# Patient Record
Sex: Female | Born: 1980 | Race: White | Hispanic: No | Marital: Married | Smoking: Never smoker
Health system: Southern US, Community
[De-identification: ages and names within clinical notes are randomized; demographics above are authoritative.]

## PROBLEM LIST (undated history)

## (undated) ENCOUNTER — Inpatient Hospital Stay (HOSPITAL_COMMUNITY): Payer: Self-pay

## (undated) DIAGNOSIS — K219 Gastro-esophageal reflux disease without esophagitis: Secondary | ICD-10-CM

## (undated) DIAGNOSIS — T7840XA Allergy, unspecified, initial encounter: Secondary | ICD-10-CM

## (undated) DIAGNOSIS — E119 Type 2 diabetes mellitus without complications: Secondary | ICD-10-CM

## (undated) DIAGNOSIS — F419 Anxiety disorder, unspecified: Secondary | ICD-10-CM

## (undated) DIAGNOSIS — Z8679 Personal history of other diseases of the circulatory system: Secondary | ICD-10-CM

## (undated) DIAGNOSIS — I10 Essential (primary) hypertension: Secondary | ICD-10-CM

## (undated) DIAGNOSIS — Z8659 Personal history of other mental and behavioral disorders: Secondary | ICD-10-CM

## (undated) DIAGNOSIS — F329 Major depressive disorder, single episode, unspecified: Secondary | ICD-10-CM

## (undated) HISTORY — DX: Personal history of other mental and behavioral disorders: Z86.59

## (undated) HISTORY — DX: Gastro-esophageal reflux disease without esophagitis: K21.9

## (undated) HISTORY — DX: Type 2 diabetes mellitus without complications: E11.9

## (undated) HISTORY — DX: Major depressive disorder, single episode, unspecified: F32.9

## (undated) HISTORY — DX: Allergy, unspecified, initial encounter: T78.40XA

## (undated) HISTORY — DX: Personal history of other diseases of the circulatory system: Z86.79

## (undated) HISTORY — DX: Anxiety disorder, unspecified: F41.9

## (undated) HISTORY — DX: Essential (primary) hypertension: I10

## (undated) HISTORY — PX: WISDOM TOOTH EXTRACTION: SHX21

---

## 2002-05-05 ENCOUNTER — Other Ambulatory Visit: Admission: RE | Admit: 2002-05-05 | Discharge: 2002-05-05 | Payer: Self-pay | Admitting: Obstetrics and Gynecology

## 2002-05-07 HISTORY — PX: REFRACTIVE SURGERY: SHX103

## 2002-05-07 HISTORY — PX: OTHER SURGICAL HISTORY: SHX169

## 2003-06-01 ENCOUNTER — Other Ambulatory Visit: Admission: RE | Admit: 2003-06-01 | Discharge: 2003-06-01 | Payer: Self-pay | Admitting: Obstetrics and Gynecology

## 2003-09-29 ENCOUNTER — Ambulatory Visit (HOSPITAL_COMMUNITY): Admission: RE | Admit: 2003-09-29 | Discharge: 2003-09-29 | Payer: Self-pay | Admitting: Neurology

## 2005-02-13 ENCOUNTER — Ambulatory Visit: Payer: Self-pay | Admitting: Cardiovascular Disease

## 2005-04-02 ENCOUNTER — Ambulatory Visit: Payer: Self-pay

## 2005-04-02 ENCOUNTER — Encounter: Payer: Self-pay | Admitting: Cardiology

## 2005-04-02 ENCOUNTER — Ambulatory Visit (HOSPITAL_COMMUNITY): Admission: RE | Admit: 2005-04-02 | Discharge: 2005-04-02 | Payer: Self-pay | Admitting: Family Medicine

## 2006-07-24 LAB — CONVERTED CEMR LAB: Pap Smear: NORMAL

## 2006-12-01 ENCOUNTER — Emergency Department (HOSPITAL_COMMUNITY): Admission: EM | Admit: 2006-12-01 | Discharge: 2006-12-01 | Payer: Self-pay | Admitting: Family Medicine

## 2007-02-26 ENCOUNTER — Encounter: Admission: RE | Admit: 2007-02-26 | Discharge: 2007-02-26 | Payer: Self-pay | Admitting: Obstetrics and Gynecology

## 2007-03-17 ENCOUNTER — Emergency Department (HOSPITAL_COMMUNITY): Admission: EM | Admit: 2007-03-17 | Discharge: 2007-03-17 | Payer: Self-pay | Admitting: Family Medicine

## 2007-07-02 ENCOUNTER — Emergency Department (HOSPITAL_COMMUNITY): Admission: EM | Admit: 2007-07-02 | Discharge: 2007-07-02 | Payer: Self-pay | Admitting: Family Medicine

## 2007-07-03 ENCOUNTER — Emergency Department (HOSPITAL_COMMUNITY): Admission: EM | Admit: 2007-07-03 | Discharge: 2007-07-03 | Payer: Self-pay | Admitting: Emergency Medicine

## 2007-07-04 ENCOUNTER — Telehealth (INDEPENDENT_AMBULATORY_CARE_PROVIDER_SITE_OTHER): Payer: Self-pay | Admitting: *Deleted

## 2007-07-10 ENCOUNTER — Ambulatory Visit: Payer: Self-pay | Admitting: Internal Medicine

## 2007-07-10 LAB — CONVERTED CEMR LAB
ALT: 19 units/L (ref 0–35)
AST: 17 units/L (ref 0–37)
Albumin: 3.3 g/dL — ABNORMAL LOW (ref 3.5–5.2)
Alkaline Phosphatase: 48 units/L (ref 39–117)
BUN: 9 mg/dL (ref 6–23)
Basophils Absolute: 0 10*3/uL (ref 0.0–0.1)
Basophils Relative: 0.4 % (ref 0.0–1.0)
Bilirubin Urine: NEGATIVE
Bilirubin, Direct: 0.1 mg/dL (ref 0.0–0.3)
CO2: 29 meq/L (ref 19–32)
Calcium: 8.8 mg/dL (ref 8.4–10.5)
Chloride: 104 meq/L (ref 96–112)
Cholesterol: 166 mg/dL (ref 0–200)
Creatinine, Ser: 0.7 mg/dL (ref 0.4–1.2)
Eosinophils Absolute: 0.1 10*3/uL (ref 0.0–0.6)
Eosinophils Relative: 2.1 % (ref 0.0–5.0)
GFR calc Af Amer: 130 mL/min
GFR calc non Af Amer: 108 mL/min
Glucose, Bld: 82 mg/dL (ref 70–99)
HCT: 39.5 % (ref 36.0–46.0)
HDL: 38.4 mg/dL — ABNORMAL LOW (ref >39.0)
Hemoglobin: 13.6 g/dL (ref 12.0–15.0)
Ketones, ur: NEGATIVE mg/dL
LDL Cholesterol: 104 mg/dL — ABNORMAL HIGH (ref 0–99)
Leukocytes, UA: NEGATIVE
Lymphocytes Relative: 49.4 % — ABNORMAL HIGH (ref 12.0–46.0)
MCHC: 34.5 g/dL (ref 30.0–36.0)
MCV: 86.6 fL (ref 78.0–100.0)
Monocytes Absolute: 0.4 10*3/uL (ref 0.2–0.7)
Monocytes Relative: 8.4 % (ref 3.0–11.0)
Neutro Abs: 2 10*3/uL (ref 1.4–7.7)
Neutrophils Relative %: 39.7 % — ABNORMAL LOW (ref 43.0–77.0)
Nitrite: NEGATIVE
Platelets: 326 10*3/uL (ref 150–400)
Potassium: 4.1 meq/L (ref 3.5–5.1)
RBC: 4.56 M/uL (ref 3.87–5.11)
RDW: 10.8 % — ABNORMAL LOW (ref 11.5–14.6)
Sodium: 138 meq/L (ref 135–145)
Specific Gravity, Urine: 1.025 (ref 1.000–1.03)
TSH: 1.08 microintl units/mL (ref 0.35–5.50)
Total Bilirubin: 0.3 mg/dL (ref 0.3–1.2)
Total CHOL/HDL Ratio: 4.3
Total Protein, Urine: NEGATIVE mg/dL
Total Protein: 7 g/dL (ref 6.0–8.3)
Triglycerides: 117 mg/dL (ref 0–149)
Urine Glucose: NEGATIVE mg/dL
Urobilinogen, UA: 0.2 (ref 0.0–1.0)
VLDL: 23 mg/dL (ref 0–40)
WBC: 5 10*3/uL (ref 4.5–10.5)
pH: 6 (ref 5.0–8.0)

## 2007-07-14 ENCOUNTER — Ambulatory Visit: Payer: Self-pay | Admitting: Internal Medicine

## 2007-07-14 DIAGNOSIS — G43701 Chronic migraine without aura, not intractable, with status migrainosus: Secondary | ICD-10-CM | POA: Insufficient documentation

## 2007-07-14 DIAGNOSIS — R51 Headache: Secondary | ICD-10-CM

## 2007-07-14 DIAGNOSIS — M549 Dorsalgia, unspecified: Secondary | ICD-10-CM | POA: Insufficient documentation

## 2007-07-14 DIAGNOSIS — F3289 Other specified depressive episodes: Secondary | ICD-10-CM | POA: Insufficient documentation

## 2007-07-14 DIAGNOSIS — F329 Major depressive disorder, single episode, unspecified: Secondary | ICD-10-CM

## 2007-07-14 HISTORY — DX: Major depressive disorder, single episode, unspecified: F32.9

## 2007-07-14 HISTORY — DX: Other specified depressive episodes: F32.89

## 2007-10-25 ENCOUNTER — Emergency Department (HOSPITAL_COMMUNITY): Admission: EM | Admit: 2007-10-25 | Discharge: 2007-10-25 | Payer: Self-pay | Admitting: Emergency Medicine

## 2007-11-03 ENCOUNTER — Telehealth: Payer: Self-pay | Admitting: Internal Medicine

## 2007-11-04 ENCOUNTER — Ambulatory Visit: Payer: Self-pay | Admitting: Internal Medicine

## 2007-11-04 LAB — CONVERTED CEMR LAB
Bilirubin Urine: NEGATIVE
Hemoglobin, Urine: NEGATIVE
Ketones, ur: NEGATIVE mg/dL
Leukocytes, UA: NEGATIVE
Nitrite: NEGATIVE
Specific Gravity, Urine: <=1.005 (ref 1.000–1.03)
Total Protein, Urine: NEGATIVE mg/dL
Urine Glucose: NEGATIVE mg/dL
Urobilinogen, UA: 0.2 (ref 0.0–1.0)
pH: 6 (ref 5.0–8.0)

## 2007-11-05 ENCOUNTER — Ambulatory Visit: Payer: Self-pay | Admitting: Internal Medicine

## 2007-11-05 DIAGNOSIS — R300 Dysuria: Secondary | ICD-10-CM | POA: Insufficient documentation

## 2007-11-05 DIAGNOSIS — H5310 Unspecified subjective visual disturbances: Secondary | ICD-10-CM | POA: Insufficient documentation

## 2007-11-05 LAB — CONVERTED CEMR LAB
BUN: 8 mg/dL (ref 6–23)
CO2: 28 meq/L (ref 19–32)
Calcium: 9.3 mg/dL (ref 8.4–10.5)
Chloride: 103 meq/L (ref 96–112)
Creatinine, Ser: 0.7 mg/dL (ref 0.4–1.2)
GFR calc Af Amer: 130 mL/min
GFR calc non Af Amer: 108 mL/min
Glucose, Bld: 96 mg/dL (ref 70–99)
Potassium: 4.1 meq/L (ref 3.5–5.1)
Sodium: 139 meq/L (ref 135–145)

## 2007-11-06 ENCOUNTER — Ambulatory Visit (HOSPITAL_COMMUNITY): Admission: RE | Admit: 2007-11-06 | Discharge: 2007-11-06 | Payer: Self-pay | Admitting: Internal Medicine

## 2007-11-10 ENCOUNTER — Telehealth: Payer: Self-pay | Admitting: Internal Medicine

## 2007-11-24 ENCOUNTER — Encounter: Payer: Self-pay | Admitting: Internal Medicine

## 2009-06-20 ENCOUNTER — Ambulatory Visit: Payer: Self-pay | Admitting: Internal Medicine

## 2009-06-20 DIAGNOSIS — J45901 Unspecified asthma with (acute) exacerbation: Secondary | ICD-10-CM | POA: Insufficient documentation

## 2009-06-22 ENCOUNTER — Emergency Department (HOSPITAL_COMMUNITY): Admission: EM | Admit: 2009-06-22 | Discharge: 2009-06-22 | Payer: Self-pay | Admitting: Emergency Medicine

## 2009-06-22 ENCOUNTER — Encounter: Payer: Self-pay | Admitting: Cardiovascular Disease

## 2009-06-23 ENCOUNTER — Ambulatory Visit: Payer: Self-pay | Admitting: Internal Medicine

## 2009-06-23 LAB — CONVERTED CEMR LAB
Free T4: 0.9 ng/dL (ref 0.6–1.6)
T3 Uptake Ratio: 38.6 % — ABNORMAL HIGH (ref 22.5–37.0)
TSH: 1.21 microintl units/mL (ref 0.35–5.50)

## 2009-06-26 ENCOUNTER — Telehealth: Payer: Self-pay | Admitting: Internal Medicine

## 2009-06-28 ENCOUNTER — Telehealth (INDEPENDENT_AMBULATORY_CARE_PROVIDER_SITE_OTHER): Payer: Self-pay | Admitting: *Deleted

## 2009-07-11 ENCOUNTER — Encounter: Payer: Self-pay | Admitting: Internal Medicine

## 2009-07-11 ENCOUNTER — Ambulatory Visit: Payer: Self-pay

## 2009-07-22 ENCOUNTER — Telehealth: Payer: Self-pay | Admitting: Internal Medicine

## 2010-03-23 DIAGNOSIS — Z8679 Personal history of other diseases of the circulatory system: Secondary | ICD-10-CM

## 2010-03-23 DIAGNOSIS — Z8659 Personal history of other mental and behavioral disorders: Secondary | ICD-10-CM

## 2010-03-23 DIAGNOSIS — M21619 Bunion of unspecified foot: Secondary | ICD-10-CM | POA: Insufficient documentation

## 2010-03-23 DIAGNOSIS — I7300 Raynaud's syndrome without gangrene: Secondary | ICD-10-CM | POA: Insufficient documentation

## 2010-03-23 HISTORY — DX: Personal history of other diseases of the circulatory system: Z86.79

## 2010-03-23 HISTORY — DX: Personal history of other mental and behavioral disorders: Z86.59

## 2010-03-24 ENCOUNTER — Ambulatory Visit: Payer: Self-pay | Admitting: Cardiovascular Disease

## 2010-03-24 DIAGNOSIS — I10 Essential (primary) hypertension: Secondary | ICD-10-CM | POA: Insufficient documentation

## 2010-03-24 DIAGNOSIS — R002 Palpitations: Secondary | ICD-10-CM | POA: Insufficient documentation

## 2010-03-24 DIAGNOSIS — I471 Supraventricular tachycardia, unspecified: Secondary | ICD-10-CM | POA: Insufficient documentation

## 2010-03-24 HISTORY — DX: Essential (primary) hypertension: I10

## 2010-04-06 ENCOUNTER — Ambulatory Visit (HOSPITAL_COMMUNITY)
Admission: RE | Admit: 2010-04-06 | Discharge: 2010-04-06 | Payer: Self-pay | Source: Home / Self Care | Admitting: Cardiovascular Disease

## 2010-04-06 ENCOUNTER — Ambulatory Visit: Payer: Self-pay | Admitting: Cardiovascular Disease

## 2010-04-06 ENCOUNTER — Ambulatory Visit: Payer: Self-pay

## 2010-04-06 ENCOUNTER — Encounter: Payer: Self-pay | Admitting: Cardiovascular Disease

## 2010-04-06 ENCOUNTER — Ambulatory Visit: Payer: Self-pay | Admitting: Cardiology

## 2010-04-06 LAB — CONVERTED CEMR LAB
Anti Nuclear Antibody(ANA): NEGATIVE
Free T4: 1.06 ng/dL (ref 0.80–1.80)
Rheumatoid fact SerPl-aCnc: <20 intl units/mL (ref 0–20)
Scleroderma (Scl-70) (ENA) Antibody, IgG: <1 (ref <30)
Sed Rate: 14 mm/hr (ref 0–22)
TSH: 1.225 microintl units/mL (ref 0.350–4.500)

## 2010-04-17 ENCOUNTER — Ambulatory Visit: Payer: Self-pay | Admitting: Cardiovascular Disease

## 2010-04-17 DIAGNOSIS — R9389 Abnormal findings on diagnostic imaging of other specified body structures: Secondary | ICD-10-CM | POA: Insufficient documentation

## 2010-06-06 NOTE — Progress Notes (Signed)
----   Converted from flag ---- ---- 06/23/2009 1:24 PM, Edman Circle wrote: appt 3/4 @ 10:30 NPO midnite. clear liquids ok no Carbonated beverages. no gasey foods  ---- 06/23/2009 12:39 PM, Dagoberto Reef wrote: Thanks  ---- 06/23/2009 12:33 PM, Jacques Navy MD wrote: The following orders have been entered for this patient and placed on Admin Hold:  Type:     Referral       Code:   Radiology Description:   Radiology Referral Order Date:   06/23/2009   Authorized By:   Jacques Navy MD Order #:   [id number] Clinical Notes:   Name of Test or Procedure: Renal artery doppler study -  patient with new onset hypertension r/o RAS  Of What:  Special Instructions ------------------------------

## 2010-06-06 NOTE — Assessment & Plan Note (Signed)
Summary: bp: 171/107 pulse: 114 / ok ami/cd   Vital Signs:  Patient profile:   30 year old female Height:      66 inches Weight:      131.50 pounds O2 Sat:      98 % on Room air Temp:     98.5 degrees F oral Pulse rate:   103 / minute BP sitting:   152 / 82  (left arm) Cuff size:   regular  Vitals Entered By: Brenton Grills (June 23, 2009 11:40 AM)  O2 Flow:  Room air CC: Pt has elevated BP since yesterday. Went to urgent care last ight./aj    Primary Care Provider:  Norins  CC:  Pt has elevated BP since yesterday. Went to urgent care last ight./aj .  History of Present Illness: Patient presents for recent problems with hypertension and tachycardia with BP to 171/107 and HR of 114. She has had this for several days which she has monitored at the hospital. She did go to Urgent Care - by her report she had a D-dimer and CXR  Current Medications (verified): 1)  Yaz 3-0.02 Mg  Tabs (Drospirenone-Ethinyl Estradiol) .... As Directed 2)  Xanax 0.25 Mg  Tabs (Alprazolam) .... Take 2 Tab By Mouth At Bedtime 3)  Tums 500 Mg  Chew (Calcium Carbonate Antacid) .... As Directed As Needed 4)  Strattera 100 Mg Caps (Atomoxetine Hcl) .Marland Kitchen.. 1 Qd  Allergies (verified): 1)  ! Sulfa 2)  ! Jonne Ply  Past History:  Past Medical History: Last updated: 07/14/2007 UCD Asthma - childhood ADHD Irregular heart rhythm - wore holter, no serious problem, medication related. Depression Headache Raynaud's Bunions    Phyician roster:                    Gyn - Billy Coast                    Psychiatry - Kauer                    Counselor- Madelaine Etienne  Past Surgical History: Last updated: 07/14/2007 Lasix eye surgery '04 PSH reviewed for relevance, FH reviewed for relevance  Review of Systems  The patient denies anorexia, fever, weight loss, weight gain, vision loss, chest pain, syncope, dyspnea on exertion, headaches, hemoptysis, muscle weakness, difficulty walking, and enlarged lymph nodes.     Physical Exam  General:  alert, well-developed, and well-nourished.   Head:  normocephalic and atraumatic.   Eyes:  vision grossly intact, pupils equal, pupils round, corneas and lenses clear, no optic disk abnormalities, and no retinal abnormalitiies.   Lungs:  normal respiratory effort and no intercostal retractions.   Heart:  normal rate and regular rhythm.   Neurologic:  alert & oriented X3, cranial nerves II-XII intact, gait normal, and DTRs symmetrical and normal.     Impression & Recommendations:  Problem # 1:  ELEVATED BLOOD PRESSURE WITHOUT DIAGNOSIS OF HYPERTENSION (ICD-796.2) New on-set elevated BP. Record reviewed - over the past 2 years there has been a gradual increase in BP. Now with suden elevation raising issue of secondary causation: RAS. Very unlikely to have pheo -with no adrenergic symptoms.  Plan = start low dose beta-blocker            f/u BP in the next day or three            Renal artery doppler as first step to r/o RAS  Her updated medication list for  this problem includes:    Metoprolol Tartrate 25 Mg Tabs (Metoprolol tartrate) .Marland Kitchen... 1 by mouth two times a day  Orders: TLB-TSH (Thyroid Stimulating Hormone) (84443-TSH) TLB-T4 (Thyrox), Free [phone]) TLB-T3 Uptake (58099-I3JA) Radiology Referral (Radiology)  Complete Medication List: 1)  Yaz 3-0.02 Mg Tabs (Drospirenone-ethinyl estradiol) .... As directed 2)  Xanax 0.25 Mg Tabs (Alprazolam) .... Take 2 tab by mouth at bedtime 3)  Tums 500 Mg Chew (Calcium carbonate antacid) .... As directed as needed 4)  Strattera 100 Mg Caps (Atomoxetine hcl) .Marland Kitchen.. 1 qd 5)  Metoprolol Tartrate 25 Mg Tabs (Metoprolol tartrate) .Marland Kitchen.. 1 by mouth two times a day Prescriptions: METOPROLOL TARTRATE 25 MG TABS (METOPROLOL TARTRATE) 1 by mouth two times a day  #60 x 1   Entered and Authorized by:   Jacques Navy MD   Signed by:   Jacques Navy MD on 06/23/2009   Method used:   Electronically to        [facility]* (retail)       [address]       [address] Shipping/mailing       [place], [place]  [address]       Ph: [phone]       Fax: [phone]   RxID:   [id number]

## 2010-06-06 NOTE — Progress Notes (Signed)
Summary: BLADDER INFECTION ???? WAS SEEN IN URG CARE  Phone Note Call from Patient   Caller: Patient Summary of Call: PER PT WAS SEEN AT [facility] CARE 10/25/07 CONCERNING BLADDER INFECTION.  WAS GIVEN ANTIBIOTICS/NOT HELPING.  REQUESTING THIS [facility] APPT.  Cleotis Lema #  [id number] Initial call taken by: Ivar Bury,  November 03, 2007 1:41 PM  Follow-up for Phone Call        OK for appointment preceeded by repeat U/A Wedns Follow-up by: Jacques Navy MD,  November 03, 2007 6:23 PM  Additional Follow-up for Phone Call Additional follow up Details #1::        Appt Scheduled lab ordered in IDX pt to come to lab today after work and appt in the AM. Additional Follow-up by: Zackery Barefoot CMA,  November 04, 2007 9:29 AM

## 2010-06-06 NOTE — Assessment & Plan Note (Signed)
Summary: ASTHMA/ NEEDS RX/NWS  #   Vital Signs:  Patient profile:   30 year old female Height:      66 inches Weight:      133 pounds BMI:     21.54 O2 Sat:      98 % on Room air Temp:     97.6 degrees F oral Pulse rate:   105 / minute BP sitting:   148 / 92  (left arm) Cuff size:   regular  Vitals Entered By: Bill Salinas CMA (June 20, 2009 3:34 PM)  O2 Flow:  Room air CC: pt here due to sudden onset of asthma attack this weekend. Pt thinks it may have been triggered by ciggerrette smoke/ ab   Primary Care Provider:  Norins  CC:  pt here due to sudden onset of asthma attack this weekend. Pt thinks it may have been triggered by ciggerrette smoke/ ab.  History of Present Illness: Long h/o asthma and allergy with shots and treatments. She has done well for the last few years. She does have some cold exposure wheezing. This past weekend she was expsed to smoke and subsequently had an asthmatic flare: very tight chest and wheezing. She did clear after removal from exposure but has some residual discomfort. She has not had an inhaler for some time and feels that she may need to have an inhaler for emergencies.   Current Medications (verified): 1)  Yaz 3-0.02 Mg  Tabs (Drospirenone-Ethinyl Estradiol) .... As Directed 2)  Xanax 0.25 Mg  Tabs (Alprazolam) .... Take 2 Tab By Mouth At Bedtime 3)  Tums 500 Mg  Chew (Calcium Carbonate Antacid) .... As Directed As Needed 4)  Strattera 100 Mg Caps (Atomoxetine Hcl) .Marland Kitchen.. 1 Qd  Allergies (verified): 1)  ! Sulfa 2)  ! Jonne Ply  Past History:  Past Medical History: Last updated: 07/14/2007 UCD Asthma - childhood ADHD Irregular heart rhythm - wore holter, no serious problem, medication related. Depression Headache Raynaud's Bunions    Phyician roster:                    Gyn - Billy Coast                    Psychiatry - Kauer                    Counselor- Madelaine Etienne  Past Surgical History: Last updated: 07/14/2007 Lasix eye  surgery '04 PSH reviewed for relevance, FH reviewed for relevance  Review of Systems       The patient complains of dyspnea on exertion and prolonged cough.  The patient denies anorexia, fever, weight loss, weight gain, decreased hearing, hoarseness, chest pain, peripheral edema, melena, severe indigestion/heartburn, genital sores, suspicious skin lesions, difficulty walking, unusual weight change, enlarged lymph nodes, and angioedema.    Physical Exam  General:  Well-developed,well-nourished,in no acute distress; alert,appropriate and cooperative throughout examination Head:  Normocephalic and atraumatic without obvious abnormalities. No apparent alopecia or balding. Eyes:  corneas and lenses clear and no injection.   Lungs:  normal respiratory effort, no accessory muscle use, normal breath sounds, and no wheezes.   Heart:  normal rate and regular rhythm.     Impression & Recommendations:  Problem # 1:  ASTHMA, EXTRINSIC, WITH ACUTE EXACERBATION (ICD-493.02) Had a remote history of asthma and has had a recent exacerbtion due to cigarette smoke exposure. Currently doing well  Plan - ProAir MDI to have available for as needed use.  For recurrent episode with respiratory distress - seek medical attention.  Complete Medication List: 1)  Yaz 3-0.02 Mg Tabs (Drospirenone-ethinyl estradiol) .... As directed 2)  Xanax 0.25 Mg Tabs (Alprazolam) .... Take 2 tab by mouth at bedtime 3)  Tums 500 Mg Chew (Calcium carbonate antacid) .... As directed as needed 4)  Strattera 100 Mg Caps (Atomoxetine hcl) .Marland Kitchen.. 1 qd
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
## 2010-06-06 NOTE — Progress Notes (Signed)
Summary: Result ?'s  ---- Converted from flag ---- ---- 11/09/2007 3:54 PM, Jacques Navy MD wrote: please call patient: MRI brain is normal. For continued visual changes we will refer to neurology. ------------------------------  Phone Note Outgoing Call Call back at Work Phone [phone]   Call placed by: Zackery Barefoot CMA,  November 10, 2007 2:14 PM Call placed to: Patient Summary of Call: Advised pt of the above pt stated she is much better and is going to see her eye doctor to see if there are any changes. Pt states she reviewed her study and wants to know what the statement "This is compatible with a benign venous angioma." means. Advised pt will have to speak with MD being the MRI brain normal. Initial call taken by: Zackery Barefoot CMA,  November 10, 2007 2:14 PM  Follow-up for Phone Call        benign venous angioma is a vascular tangle of no signficance. Follow-up by: Jacques Navy MD,  November 10, 2007 5:57 PM  Additional Follow-up for Phone Call Additional follow up Details #1::        SPOKE WITH PATIENT, PATIENT OK'D INFORMATION. Additional Follow-up by: Shonna Chock,  November 11, 2007 3:13 PM

## 2010-06-06 NOTE — Progress Notes (Signed)
Summary: EARLIER APT  Phone Note Call from Patient Call back at [facility] Phone [phone]   Summary of Call: Pt recently seen at urgent care and ER recently for severe back pain. She has never had pain like this before. Pt has a new pt apt with Dr Debby Bud in April. Would you see pt for acute visit before April apt? Initial call taken by: Lamar Sprinkles,  July 04, 2007 11:59 AM  Follow-up for Phone Call        already passed along an e-mail message to Mrs. Caswell Corwin to schedule an earlier appointment. Follow-up by: Jacques Navy MD,  July 04, 2007 1:28 PM  Additional Follow-up for Phone Call Additional follow up Details #1::        I LMOM FRIDAY 2-27 AND 2 MORE MSG TODAY.  WAITING FOR HER TO CALL us BACK. Additional Follow-up by: Hilarie Fredrickson,  July 08, 2007 1:58 PM    Additional Follow-up for Phone Call Additional follow up Details #2::    HER APPT. HAS BEEN MOVED UP TO July 14, 2007 Follow-up by: Hilarie Fredrickson,  July 09, 2007 2:19 PM

## 2010-06-06 NOTE — Progress Notes (Signed)
Summary: BP UPDATE  Phone Note Outgoing Call   Reason for Call: Discuss lab or test results Summary of Call: please call patient - tyroid functions normal. How is her BP? Thanks Initial call taken by: Jacques Navy MD,  June 26, 2009 7:09 PM  Follow-up for Phone Call        pt states bp still elevated about 150 over 100. she states BP med drops BP rapidly so she is not taking. Pt states her pulse is still elevated 105- 110 Follow-up by: Ami Bullins CMA,  June 27, 2009 9:45 AM  Additional Follow-up for Phone Call Additional follow up Details #1::        Pt called back with update, BP today 150/99 pulse 110. She is not taking bp med. She has a 30 yr old and says med causes her to be lethargic and tired. Additional Follow-up by: Lamar Sprinkles, CMA,  June 27, 2009 2:38 PM    Additional Follow-up for Phone Call Additional follow up Details #2::    the metoprolol dose is low. The feeling of sluggishness will pass and she needs treatment for her hypertension and tachycardia. I strongly recommend taking the medication. If the side effects are intolerable add to my schedule for change in medication Follow-up by: Jacques Navy MD,  June 27, 2009 4:03 PM  Additional Follow-up for Phone Call Additional follow up Details #3:: Details for Additional Follow-up Action Taken: Spoke with pt, informed of above. She will try med and call office back with any further problems.  Additional Follow-up by: Lamar Sprinkles, CMA,  June 27, 2009 4:58 PM

## 2010-06-06 NOTE — Consult Note (Signed)
Summary: Consultation Report/[facility]  Consultation Report/[facility]   Imported By: Lester Norwich 12/01/2007 11:18:23  _____________________________________________________________________  External Attachment:    Type:   Image     Comment:   External Document
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
## 2010-06-06 NOTE — Letter (Signed)
Summary: MC: Physician Documentation Sheet  MC: Physician Documentation Sheet   Imported By: Earl Many 03/23/2010 11:31:08  _____________________________________________________________________  External Attachment:    Type:   Image     Comment:   External Document

## 2010-06-06 NOTE — Miscellaneous (Signed)
Summary: Orders Update  Clinical Lists Changes  Orders: Added new Test order of Renal Artery Duplex (Renal Artery Duplex) - Signed 

## 2010-06-06 NOTE — Progress Notes (Signed)
  Phone Note Outgoing Call   Reason for Call: Discuss lab or test results Summary of Call: please call patient - renal ultrasound with normal blood flow. No renal artery stenosis.  Thanks Initial call taken by: Jacques Navy MD,  July 22, 2009 1:16 PM  Follow-up for Phone Call        informed pt of results Follow-up by: Ami Bullins CMA,  July 22, 2009 3:34 PM
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
## 2010-07-26 LAB — D-DIMER, QUANTITATIVE: D-Dimer, Quant: <0.22 ug/mL-FEU (ref 0.00–0.48)

## 2010-09-17 ENCOUNTER — Emergency Department: Payer: Self-pay | Admitting: Emergency Medicine

## 2010-09-17 ENCOUNTER — Emergency Department (HOSPITAL_COMMUNITY)
Admission: EM | Admit: 2010-09-17 | Discharge: 2010-09-17 | Disposition: A | Discharge status: Home / Self Care | Payer: Commercial Managed Care - PPO | Attending: Emergency Medicine | Admitting: Emergency Medicine

## 2010-09-17 ENCOUNTER — Emergency Department (HOSPITAL_COMMUNITY): Payer: Commercial Managed Care - PPO

## 2010-09-17 DIAGNOSIS — M545 Low back pain, unspecified: Secondary | ICD-10-CM | POA: Insufficient documentation

## 2010-09-17 DIAGNOSIS — R319 Hematuria, unspecified: Secondary | ICD-10-CM | POA: Insufficient documentation

## 2010-09-17 DIAGNOSIS — I10 Essential (primary) hypertension: Secondary | ICD-10-CM | POA: Insufficient documentation

## 2010-09-17 LAB — URINE MICROSCOPIC-ADD ON

## 2010-09-17 LAB — POCT PREGNANCY, URINE
Preg Test, Ur: NEGATIVE
Preg Test, Ur: NEGATIVE

## 2010-09-17 LAB — URINALYSIS, ROUTINE W REFLEX MICROSCOPIC
Bilirubin Urine: NEGATIVE
Glucose, UA: NEGATIVE mg/dL
Ketones, ur: NEGATIVE mg/dL
Leukocytes, UA: NEGATIVE
Nitrite: NEGATIVE
Protein, ur: NEGATIVE mg/dL
Specific Gravity, Urine: 1.024 (ref 1.005–1.030)
Urobilinogen, UA: 0.2 mg/dL (ref 0.0–1.0)
pH: 5 (ref 5.0–8.0)

## 2010-10-06 ENCOUNTER — Encounter: Payer: Self-pay | Admitting: Internal Medicine

## 2010-10-06 ENCOUNTER — Ambulatory Visit (INDEPENDENT_AMBULATORY_CARE_PROVIDER_SITE_OTHER): Payer: Commercial Managed Care - PPO | Admitting: Internal Medicine

## 2010-10-06 VITALS — BP 100/72 | HR 76 | Temp 98.0°F | Ht 66.0 in | Wt 152.0 lb

## 2010-10-06 DIAGNOSIS — F419 Anxiety disorder, unspecified: Secondary | ICD-10-CM

## 2010-10-06 DIAGNOSIS — H9193 Unspecified hearing loss, bilateral: Secondary | ICD-10-CM

## 2010-10-06 DIAGNOSIS — Z0000 Encounter for general adult medical examination without abnormal findings: Secondary | ICD-10-CM | POA: Insufficient documentation

## 2010-10-06 DIAGNOSIS — H9190 Unspecified hearing loss, unspecified ear: Secondary | ICD-10-CM

## 2010-10-06 DIAGNOSIS — H6090 Unspecified otitis externa, unspecified ear: Secondary | ICD-10-CM

## 2010-10-06 DIAGNOSIS — F411 Generalized anxiety disorder: Secondary | ICD-10-CM

## 2010-10-06 DIAGNOSIS — H60399 Other infective otitis externa, unspecified ear: Secondary | ICD-10-CM

## 2010-10-06 DIAGNOSIS — Z0001 Encounter for general adult medical examination with abnormal findings: Secondary | ICD-10-CM | POA: Insufficient documentation

## 2010-10-06 DIAGNOSIS — I10 Essential (primary) hypertension: Secondary | ICD-10-CM

## 2010-10-06 DIAGNOSIS — J45909 Unspecified asthma, uncomplicated: Secondary | ICD-10-CM | POA: Insufficient documentation

## 2010-10-06 HISTORY — DX: Anxiety disorder, unspecified: F41.9

## 2010-10-06 MED ORDER — NEOMYCIN-POLYMYXIN-HC 3.5-10000-1 OT SOLN: 3.0000 [drp] | Freq: Three times a day (TID) | OTIC | Status: AC

## 2010-10-06 NOTE — Patient Instructions (Signed)
Both of your ears were irrigated today Take all new medications as prescribed Continue all other medications as before

## 2010-10-06 NOTE — Assessment & Plan Note (Signed)
stable overall by hx and exam,  and pt to continue medical treatment as before - does not require tx at this time

## 2010-10-06 NOTE — Assessment & Plan Note (Signed)
Improved s/p bilat wax impaction removals with irrigation

## 2010-10-06 NOTE — Progress Notes (Signed)
  Subjective:    Patient ID: Caitlin Burns, female    DOB: 1981-02-14, 30 y.o.   MRN: [id number]  HPI  Here with onset bilat hearing loss with mild bilat ear pain;  For one wk has been overall mild and intermittent, with still mild pain, but now with hearing loss bilat that will not improve with shaking the head;  No fever, HA, ST, sinus symptoms, cough and Pt denies chest pain, increased sob or doe, wheezing, orthopnea, PND, increased LE swelling, palpitations, dizziness or syncope.  No prior hx of same.  Pt denies new neurological symptoms such as new headache, or facial or extremity weakness or numbness   Pt denies polydipsia, polyuria.  Denies worsening depressive symptoms, suicidal ideation, or panic, though has ongoing anxiety, not increased recently.  Past Medical History  Diagnosis Date  . ATTENTION DEFICIT HYPERACTIVITY DISORDER, 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  . Asthma 10/06/2010  . DEPRESSION 07/14/2007  . Essential hypertension, benign 03/24/2010  . RAYNAUD'S SYNDROME, 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   No past surgical history on file.  reports that she has never smoked. She does not have any smokeless tobacco history on file. Her alcohol and drug histories not on file. family history is not on file. Allergies  Allergen Reactions  . Aspirin     REACTION: PROBLEMS BREATHING  . Sulfonamide Derivatives     REACTION: HIVES   No current outpatient prescriptions on file prior to visit.     Review of Systems All otherwise neg per pt     Objective:   Physical Exam BP 100/72  Pulse 76  Temp(Src) 98 F (36.7 C) (Oral)  Ht 5\' 6"  (1.676 m)  Wt 152 lb (68.947 kg)  BMI 24.53 kg/m2  SpO2 97%  LMP 09/17/2010 Physical Exam  VS noted, not ill appearing Constitutional: Pt appears well-developed and well-nourished.  HENT: Head: Normocephalic.  Right Ear: External canal with mild erythema, swelling after wax impaction removed Left Ear: External canal with mild erythema, swelling after wax impaction removed Eyes:  Conjunctivae and EOM are normal. Pupils are equal, round, and reactive to light.  Neck: Normal range of motion. Neck supple.  Cardiovascular: Normal rate and regular rhythm.   Pulmonary/Chest: Effort normal and breath sounds normal.  Neurological: Pt is alert. No cranial nerve deficit.  Skin: Skin is warm. No erythema.  Psychiatric: Pt behavior is normal. Thought content normal. 1+ nervous        Assessment & Plan:

## 2010-10-06 NOTE — Assessment & Plan Note (Signed)
Mild at best, for cortisporin asd

## 2010-10-06 NOTE — Assessment & Plan Note (Signed)
stable overall by hx and exam, and pt to continue medical treatment as before 

## 2011-01-10 ENCOUNTER — Other Ambulatory Visit: Payer: Self-pay

## 2011-01-10 MED ORDER — ALBUTEROL SULFATE HFA 108 (90 BASE) MCG/ACT IN AERS: 2.0000 | INHALATION_SPRAY | Freq: Four times a day (QID) | RESPIRATORY_TRACT | Status: DC | PRN

## 2011-01-10 NOTE — Telephone Encounter (Signed)
Pt called requesting refill of Albuterol inhaler. Pt says she received a sample last year. Okay to refill?  Pt request a call when Rx is filled.

## 2011-01-10 NOTE — Telephone Encounter (Signed)
Ok to refill 

## 2011-01-26 LAB — POCT URINALYSIS DIP (DEVICE)
Bilirubin Urine: NEGATIVE
Glucose, UA: NEGATIVE
Hgb urine dipstick: NEGATIVE
Ketones, ur: NEGATIVE
Nitrite: NEGATIVE
Operator id: 239701
Protein, ur: NEGATIVE
Specific Gravity, Urine: 1.01
Urobilinogen, UA: 0.2
pH: 7

## 2011-01-26 LAB — URINALYSIS, ROUTINE W REFLEX MICROSCOPIC
Bilirubin Urine: NEGATIVE
Glucose, UA: NEGATIVE
Hgb urine dipstick: NEGATIVE
Ketones, ur: NEGATIVE
Nitrite: NEGATIVE
Protein, ur: NEGATIVE
Specific Gravity, Urine: 1.035 — ABNORMAL HIGH
Urobilinogen, UA: 0.2
pH: 5.5

## 2011-01-26 LAB — DIFFERENTIAL
Basophils Absolute: 0
Basophils Relative: 0
Eosinophils Absolute: 0
Eosinophils Relative: 1
Lymphocytes Relative: 10 — ABNORMAL LOW
Lymphs Abs: 1
Monocytes Absolute: 0.4
Monocytes Relative: 4
Neutro Abs: 8.5 — ABNORMAL HIGH
Neutrophils Relative %: 85 — ABNORMAL HIGH

## 2011-01-26 LAB — BASIC METABOLIC PANEL
BUN: 9
CO2: 25
Calcium: 8.7
Chloride: 103
Creatinine, Ser: 0.63
GFR calc Af Amer: >60
GFR calc non Af Amer: >60
Glucose, Bld: 105 — ABNORMAL HIGH
Potassium: 3.7
Sodium: 134 — ABNORMAL LOW

## 2011-01-26 LAB — POCT PREGNANCY, URINE
Operator id: 239701
Preg Test, Ur: NEGATIVE

## 2011-01-26 LAB — CBC
HCT: 37.9
Hemoglobin: 13.5
MCHC: 35.6
MCV: 84.9
Platelets: 225
RBC: 4.47
RDW: 11.9
WBC: 10.1

## 2011-01-26 LAB — PREGNANCY, URINE: Preg Test, Ur: NEGATIVE

## 2011-02-01 LAB — URINALYSIS, MICROSCOPIC ONLY
Bilirubin Urine: NEGATIVE
Glucose, UA: NEGATIVE
Hgb urine dipstick: NEGATIVE
Ketones, ur: NEGATIVE
Leukocytes, UA: NEGATIVE
Nitrite: NEGATIVE
Protein, ur: NEGATIVE
Specific Gravity, Urine: 1.023
Urobilinogen, UA: 0.2
pH: 5.5

## 2011-02-01 LAB — URINE CULTURE: Colony Count: 30000

## 2011-02-01 LAB — POCT PREGNANCY, URINE
Operator id: 282151
Preg Test, Ur: NEGATIVE

## 2011-02-01 LAB — POCT URINALYSIS DIP (DEVICE)
Bilirubin Urine: NEGATIVE
Glucose, UA: NEGATIVE
Hgb urine dipstick: NEGATIVE
Ketones, ur: NEGATIVE
Nitrite: NEGATIVE
Operator id: 282151
Protein, ur: NEGATIVE
Specific Gravity, Urine: 1.025
Urobilinogen, UA: 0.2
pH: 6

## 2011-02-13 LAB — POCT RAPID STREP A: Streptococcus, Group A Screen (Direct): NEGATIVE

## 2011-04-30 ENCOUNTER — Encounter: Payer: Self-pay | Admitting: Internal Medicine

## 2011-04-30 ENCOUNTER — Ambulatory Visit (INDEPENDENT_AMBULATORY_CARE_PROVIDER_SITE_OTHER): Payer: Commercial Managed Care - PPO | Admitting: Internal Medicine

## 2011-04-30 ENCOUNTER — Telehealth: Payer: Self-pay

## 2011-04-30 VITALS — BP 110/72 | HR 83 | Temp 99.6°F | Resp 14 | Wt 154.1 lb

## 2011-04-30 DIAGNOSIS — Z20828 Contact with and (suspected) exposure to other viral communicable diseases: Secondary | ICD-10-CM

## 2011-04-30 MED ORDER — OSELTAMIVIR PHOSPHATE 75 MG PO CAPS: ORAL_CAPSULE | ORAL | Status: DC

## 2011-04-30 NOTE — Telephone Encounter (Signed)
[facility] Triage Call Report Triage Record Num: [id number] Operator: Patriciaann Clan Patient Name: Caitlin Burns Call Date & Time: 04/28/2011 5:46:36PM Patient Phone: [phone] PCP: Illene Regulus Patient Gender: Female PCP Fax : [phone] Patient DOB: 13-Mar-1981 [facility] Name: [facility] Reason for Call: Caller: [name]/Patient; PCP: Illene Regulus; CB#: [phone]; Call Reason: Daughter Has Been Dx With Flu. Would Like Tamiflu Called in; LMP 04/02/2011. Patient states her Daughter had Positivie Flu test 04/28/11 @ [facility]. States she has hx of Asthma and was advised to call her PCP for a Rx for Tamiflu. Patient is asymptomatic and afebrile at present. Patient states she received Flu vaccine 02/2011. Patient advised to call office 04/30/11 or sooner if sx develop. Patient verbalizes understanding and agreeable. Protocol(s) Used: Office Note Recommended Outcome per Protocol: Information Noted and Sent to Office Reason for Outcome: Caller information to office Care Advice: ~ 04/28/2011 5:58:00PM Page 1 of 1 [email or web address]

## 2011-05-01 NOTE — Progress Notes (Signed)
  Subjective:    Patient ID: Shade Flood, female    DOB: [birth year], 30 y.o.   MRN: [id number]  HPI Ms. Napier presents for mild sore throat. Her 22 y/o daughter has been diagnosed with influenza A and she has been in close contact. She has had low grade fever, no myalgias, no N/V/D, no cough or respiratory symptoms.  return office visit    Review of Systems System review is negative for any constitutional, cardiac, pulmonary, GI or neuro symptoms or complaints other than as described in the HPI.     Objective:   Physical Exam Vitals reviewed - low grade fever noted Gen'l- WNWD white woman in no distress HEENT- throat with mild erythema, w/o exudate Pulm - normal respirations Cor - RRR       Assessment & Plan:  Flu exposure -  Plan - tamiflu 75 mg daily x 10

## 2011-07-20 ENCOUNTER — Ambulatory Visit (INDEPENDENT_AMBULATORY_CARE_PROVIDER_SITE_OTHER)
Admission: RE | Admit: 2011-07-20 | Discharge: 2011-07-20 | Disposition: A | Discharge status: Home / Self Care | Payer: 59 | Source: Ambulatory Visit | Attending: Internal Medicine | Admitting: Internal Medicine

## 2011-07-20 ENCOUNTER — Encounter: Payer: Self-pay | Admitting: Internal Medicine

## 2011-07-20 ENCOUNTER — Ambulatory Visit (INDEPENDENT_AMBULATORY_CARE_PROVIDER_SITE_OTHER): Payer: 59 | Admitting: Internal Medicine

## 2011-07-20 VITALS — BP 128/88 | HR 81 | Temp 97.8°F | Resp 16 | Ht 66.0 in | Wt 152.0 lb

## 2011-07-20 DIAGNOSIS — S4991XA Unspecified injury of right shoulder and upper arm, initial encounter: Secondary | ICD-10-CM

## 2011-07-20 DIAGNOSIS — S46909A Unspecified injury of unspecified muscle, fascia and tendon at shoulder and upper arm level, unspecified arm, initial encounter: Secondary | ICD-10-CM

## 2011-07-20 DIAGNOSIS — S59909A Unspecified injury of unspecified elbow, initial encounter: Secondary | ICD-10-CM

## 2011-07-20 DIAGNOSIS — S4980XA Other specified injuries of shoulder and upper arm, unspecified arm, initial encounter: Secondary | ICD-10-CM

## 2011-07-20 DIAGNOSIS — S59919A Unspecified injury of unspecified forearm, initial encounter: Secondary | ICD-10-CM | POA: Insufficient documentation

## 2011-07-20 DIAGNOSIS — S6990XA Unspecified injury of unspecified wrist, hand and finger(s), initial encounter: Secondary | ICD-10-CM

## 2011-07-20 MED ORDER — HYDROCODONE-ACETAMINOPHEN 5-500 MG PO TABS: 2.0000 | ORAL_TABLET | Freq: Four times a day (QID) | ORAL | Status: AC | PRN

## 2011-07-20 NOTE — Patient Instructions (Signed)
Shoulder Pain The shoulder is a ball and socket joint. The muscles and tendons (rotator cuff) are what keep the shoulder in its joint and stable. This collection of muscles and tendons holds in the head (ball) of the humerus (upper arm bone) in the fossa (cup) of the scapula (shoulder blade). Today no reason was found for your shoulder pain. Often pain in the shoulder may be treated conservatively with temporary immobilization. For example, holding the shoulder in one place using a sling for rest. Physical therapy may be needed if problems continue. HOME CARE INSTRUCTIONS   Apply ice to the sore area for 15 to 20 minutes, 3 to 4 times per day for the first 2 days. Put the ice in a plastic bag. Place a towel between the bag of ice and your skin.   If you have or were given a shoulder sling and straps, do not remove for as long as directed by your caregiver or until you see a caregiver for a follow-up examination. If you need to remove it to shower or bathe, move your arm as little as possible.   Sleep on several pillows at night to lessen swelling and pain.   Only take over-the-counter or prescription medicines for pain, discomfort, or fever as directed by your caregiver.   Keep any follow-up appointments in order to avoid any type of permanent shoulder disability or chronic pain problems.  SEEK MEDICAL CARE IF:   Pain in your shoulder increases or new pain develops in your arm, hand, or fingers.   Your hand or fingers are colder than your other hand.   You do not obtain pain relief with the medications or your pain becomes worse.  SEEK IMMEDIATE MEDICAL CARE IF:   Your arm, hand, or fingers are numb or tingling.   Your arm, hand, or fingers are swollen, painful, or turn white or blue.   You develop chest pain or shortness of breath.  MAKE SURE YOU:   Understand these instructions.   Will watch your condition.   Will get help right away if you are not doing well or get worse.    Document Released: 01/31/2005 Document Revised: 04/12/2011 Document Reviewed: 04/07/2011 [employer] Patient Information 2012 [employer]. 

## 2011-07-22 ENCOUNTER — Encounter: Payer: Self-pay | Admitting: Internal Medicine

## 2011-07-22 NOTE — Assessment & Plan Note (Signed)
I will check an xray today to look for fracture, etc. I think she has strain and have asked her to continue taking motrin, use a sling for rest and try vicodin for pain as well

## 2011-07-22 NOTE — Assessment & Plan Note (Signed)
I think she has a contusion, I will check a plain film to look for fracture, she will rest/ice/elevate and take listed meds for pain

## 2011-07-22 NOTE — Progress Notes (Signed)
Subjective:    Patient ID: Caitlin Burns, female    DOB: 06-30-1980, 31 y.o.   MRN: [id number]  Shoulder Pain  The pain is present in the right shoulder (and right forearm). This is a new problem. The current episode started yesterday. There has been a history of trauma. The problem occurs constantly. The problem has been unchanged. The quality of the pain is described as aching. The pain is at a severity of 6/10. The pain is moderate. Associated symptoms include a limited range of motion (right shoulder) and stiffness (right shoulder). Pertinent negatives include no fever, inability to bear weight, itching, joint locking, joint swelling, numbness or tingling. The symptoms are aggravated by activity. She has tried NSAIDS for the symptoms. The treatment provided mild relief.  She was walking down some steps and slid down the last three steps and hit her right forearm and "jammed" her right shoulder. She has soreness and a bruise on her right forearm.    Review of Systems  Constitutional: Negative for fever, chills, diaphoresis, activity change, appetite change, fatigue and unexpected weight change.  HENT: Negative for neck pain and neck stiffness.   Eyes: Negative.   Respiratory: Negative.   Cardiovascular: Negative for chest pain, palpitations and leg swelling.  Gastrointestinal: Negative.   Genitourinary: Negative.   Musculoskeletal: Positive for arthralgias and stiffness (right shoulder). Negative for myalgias, back pain, joint swelling and gait problem.  Skin: Negative.  Negative for itching.  Neurological: Negative for tingling and numbness.  Hematological: Negative for adenopathy. Does not bruise/bleed easily.  Psychiatric/Behavioral: Negative.        Objective:   Physical Exam  Constitutional: She is oriented to person, place, and time. She appears well-developed and well-nourished. No distress.  HENT:  Head: Normocephalic and atraumatic.  Mouth/Throat: Oropharynx is clear and  moist. No oropharyngeal exudate.  Eyes: Conjunctivae and EOM are normal. Pupils are equal, round, and reactive to light. Right eye exhibits no discharge. Left eye exhibits no discharge. No scleral icterus.  Neck: Normal range of motion. Neck supple. No JVD present. No tracheal deviation present. No thyromegaly present.  Cardiovascular: Normal rate, regular rhythm, normal heart sounds and intact distal pulses.  Exam reveals no gallop and no friction rub.   No murmur heard. Pulmonary/Chest: Effort normal and breath sounds normal. No stridor. No respiratory distress. She has no wheezes. She has no rales. She exhibits no tenderness.  Abdominal: Soft. Bowel sounds are normal. She exhibits no distension and no mass. There is no tenderness. There is no rebound and no guarding.  Musculoskeletal: She exhibits no edema and no tenderness.       Right shoulder: She exhibits decreased range of motion. She exhibits no tenderness, no bony tenderness, no swelling, no effusion, no crepitus, no deformity, no laceration, no pain, no spasm, normal pulse and normal strength.       Cervical back: Normal. She exhibits normal range of motion, no tenderness, no bony tenderness, no swelling, no edema, no deformity, no laceration, no pain, no spasm and normal pulse.       Right forearm: She exhibits tenderness. She exhibits no bony tenderness, no swelling, no edema, no deformity and no laceration.       Arms: Lymphadenopathy:    She has no cervical adenopathy.  Neurological: She is alert and oriented to person, place, and time. She has normal reflexes. She displays normal reflexes. No cranial nerve deficit. She exhibits normal muscle tone. Coordination normal.  Skin: Skin is warm and dry.  No rash noted. She is not diaphoretic. No erythema. No pallor.  Psychiatric: She has a normal mood and affect. Her behavior is normal. Judgment and thought content normal.          Assessment & Plan:

## 2011-10-09 ENCOUNTER — Other Ambulatory Visit (HOSPITAL_COMMUNITY): Payer: Self-pay | Admitting: Obstetrics and Gynecology

## 2011-10-09 DIAGNOSIS — N979 Female infertility, unspecified: Secondary | ICD-10-CM

## 2011-10-15 ENCOUNTER — Ambulatory Visit (HOSPITAL_COMMUNITY)
Admission: RE | Admit: 2011-10-15 | Discharge: 2011-10-15 | Disposition: A | Discharge status: Home / Self Care | Payer: 59 | Source: Ambulatory Visit | Attending: Obstetrics and Gynecology | Admitting: Obstetrics and Gynecology

## 2011-10-15 DIAGNOSIS — N979 Female infertility, unspecified: Secondary | ICD-10-CM | POA: Insufficient documentation

## 2011-10-15 MED ORDER — IOHEXOL 300 MG/ML  SOLN
7.0000 mL | Freq: Once | INTRAMUSCULAR | Status: AC | PRN
  Administered 2011-10-15: 7 mL

## 2011-10-16 ENCOUNTER — Emergency Department: Payer: Self-pay | Admitting: Unknown Physician Specialty

## 2011-10-30 ENCOUNTER — Other Ambulatory Visit (INDEPENDENT_AMBULATORY_CARE_PROVIDER_SITE_OTHER): Payer: 59

## 2011-10-30 ENCOUNTER — Ambulatory Visit (INDEPENDENT_AMBULATORY_CARE_PROVIDER_SITE_OTHER): Payer: 59 | Admitting: Internal Medicine

## 2011-10-30 ENCOUNTER — Encounter: Payer: Self-pay | Admitting: Internal Medicine

## 2011-10-30 VITALS — BP 110/78 | HR 80 | Temp 98.6°F | Resp 16 | Ht 66.0 in | Wt 156.0 lb

## 2011-10-30 DIAGNOSIS — M25552 Pain in left hip: Secondary | ICD-10-CM | POA: Insufficient documentation

## 2011-10-30 DIAGNOSIS — Z0000 Encounter for general adult medical examination without abnormal findings: Secondary | ICD-10-CM

## 2011-10-30 DIAGNOSIS — R55 Syncope and collapse: Secondary | ICD-10-CM

## 2011-10-30 DIAGNOSIS — G8929 Other chronic pain: Secondary | ICD-10-CM | POA: Insufficient documentation

## 2011-10-30 DIAGNOSIS — M459 Ankylosing spondylitis of unspecified sites in spine: Secondary | ICD-10-CM | POA: Insufficient documentation

## 2011-10-30 DIAGNOSIS — M25551 Pain in right hip: Secondary | ICD-10-CM

## 2011-10-30 DIAGNOSIS — M2241 Chondromalacia patellae, right knee: Secondary | ICD-10-CM

## 2011-10-30 DIAGNOSIS — M25559 Pain in unspecified hip: Secondary | ICD-10-CM

## 2011-10-30 DIAGNOSIS — I10 Essential (primary) hypertension: Secondary | ICD-10-CM

## 2011-10-30 DIAGNOSIS — R51 Headache: Secondary | ICD-10-CM

## 2011-10-30 DIAGNOSIS — M2242 Chondromalacia patellae, left knee: Secondary | ICD-10-CM | POA: Insufficient documentation

## 2011-10-30 DIAGNOSIS — M2240 Chondromalacia patellae, unspecified knee: Secondary | ICD-10-CM

## 2011-10-30 DIAGNOSIS — R29890 Loss of height: Secondary | ICD-10-CM

## 2011-10-30 LAB — COMPREHENSIVE METABOLIC PANEL
ALT: 15 U/L (ref 0–35)
AST: 18 U/L (ref 0–37)
Albumin: 4.1 g/dL (ref 3.5–5.2)
Alkaline Phosphatase: 42 U/L (ref 39–117)
BUN: 12 mg/dL (ref 6–23)
CO2: 25 mEq/L (ref 19–32)
Calcium: 9.2 mg/dL (ref 8.4–10.5)
Chloride: 103 mEq/L (ref 96–112)
Creatinine, Ser: 0.7 mg/dL (ref 0.4–1.2)
GFR: 99.01 mL/min (ref >60.00)
Glucose, Bld: 87 mg/dL (ref 70–99)
Potassium: 4.1 mEq/L (ref 3.5–5.1)
Sodium: 137 mEq/L (ref 135–145)
Total Bilirubin: 0.5 mg/dL (ref 0.3–1.2)
Total Protein: 7.8 g/dL (ref 6.0–8.3)

## 2011-10-30 LAB — SEDIMENTATION RATE: Sed Rate: 23 mm/hr — ABNORMAL HIGH (ref 0–22)

## 2011-10-30 LAB — LIPID PANEL
Cholesterol: 194 mg/dL (ref 0–200)
HDL: 55.6 mg/dL (ref >39.00)
LDL Cholesterol: 123 mg/dL — ABNORMAL HIGH (ref 0–99)
Total CHOL/HDL Ratio: 3
Triglycerides: 76 mg/dL (ref 0.0–149.0)
VLDL: 15.2 mg/dL (ref 0.0–40.0)

## 2011-10-30 LAB — HEMOGLOBIN A1C: Hgb A1c MFr Bld: 4.9 % (ref 4.6–6.5)
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
## 2011-10-30 NOTE — Assessment & Plan Note (Signed)
BP Readings from Last 3 Encounters:  10/30/11 110/78  07/20/11 128/88  04/30/11 110/72

## 2011-10-30 NOTE — Patient Instructions (Addendum)
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 For increased pain or instability doing stair - ortho for treatment Hip pain - will check labs to rule out RA, connective tissue disease. Use it or loose it: need to do flex stretch exercise. For pain Tylenol is first choice and otc NSAIDs if needed. Loss of consciousness -  Vaso-vagal syncope = a simple feint. Don't know why you have this. Weird feelings with hunger etc. - will check A1C to look for diabetes Cerebral aneurysm  - with family h/o starting in 30's and some episodes of Headache with paresthesia will request an MRA brain to look for any aneurysm. Bone health - probably ok. No value in DXA scan at this time. You need 1200 mg daily (diet plus supplement) calcium; you need 8700-1,000 iu Vitamin D daily.

## 2011-10-30 NOTE — Assessment & Plan Note (Signed)
Normal exam w/o crepitus or click. Unlikely to have any radiographic changes. She is likely to have early OA but need to r/o connective tissue disease or RA  Plan continued physical activity  Lab_ ANA, ESR, RF  APAP as drug of first choice

## 2011-10-30 NOTE — Assessment & Plan Note (Signed)
Patient with a h/o headaches but has recently had unusual and severe HA. Her grandmother had cerebral anuerysm with first appearance with sudden on-set at age 31. Her mother has also had CNS structual disease.  Plan - r/o aneurysmal disease CNS - MRA brain

## 2011-10-30 NOTE — Assessment & Plan Note (Signed)
Patient with marked crepitus both knees. She admits to suprapatellar pain especially with stair climbing. This has yet to limit her activities.  Plan For worsening symptoms she will need ortho consult

## 2011-10-31 LAB — RHEUMATOID FACTOR: Rhuematoid fact SerPl-aCnc: <10 IU/mL (ref <=14)

## 2011-10-31 LAB — ANA: Anti Nuclear Antibody(ANA): NEGATIVE

## 2011-11-01 ENCOUNTER — Other Ambulatory Visit: Payer: Self-pay | Admitting: Internal Medicine

## 2011-11-01 DIAGNOSIS — R55 Syncope and collapse: Secondary | ICD-10-CM

## 2011-11-02 ENCOUNTER — Ambulatory Visit (HOSPITAL_COMMUNITY)
Admission: RE | Admit: 2011-11-02 | Discharge: 2011-11-02 | Disposition: A | Discharge status: Home / Self Care | Payer: 59 | Source: Ambulatory Visit | Attending: Internal Medicine | Admitting: Internal Medicine

## 2011-11-02 DIAGNOSIS — I10 Essential (primary) hypertension: Secondary | ICD-10-CM | POA: Insufficient documentation

## 2011-11-02 DIAGNOSIS — R55 Syncope and collapse: Secondary | ICD-10-CM | POA: Insufficient documentation

## 2011-11-02 DIAGNOSIS — R51 Headache: Secondary | ICD-10-CM | POA: Insufficient documentation

## 2011-11-02 LAB — HCG, QUANTITATIVE, PREGNANCY: hCG, Beta Chain, Quant, S: <1 m[IU]/mL (ref <5)

## 2011-11-04 ENCOUNTER — Encounter: Payer: Self-pay | Admitting: Internal Medicine

## 2011-11-07 ENCOUNTER — Telehealth: Payer: Self-pay | Admitting: *Deleted

## 2011-11-07 NOTE — Telephone Encounter (Signed)
Patient notified of test results 

## 2011-11-07 NOTE — Telephone Encounter (Signed)
Message copied by Elnora Morrison on Wed Nov 07, 2011  4:49 PM ------      Message from: Jacques Navy      Created: Sun Nov 04, 2011  8:52 PM       Call patient; negative pregnancy test.

## 2011-11-28 ENCOUNTER — Encounter: Payer: 59 | Attending: "Endocrinology | Admitting: *Deleted

## 2011-11-28 DIAGNOSIS — Z713 Dietary counseling and surveillance: Secondary | ICD-10-CM

## 2011-12-04 NOTE — Progress Notes (Signed)
Patient attended the [facility]: Hypertension/High Cholesterol nutrition class on 11/28/11.  Topics covered include:   1. Complications of Hyperlipidemia and/or Hypertension. 2. Ways to reduce risk of heart disease.  3. Identifying fat and sodium content on food labels. 4. Ways to decrease sodium intake. 5. Optimal amount of daily saturated fat intake. 6. Optimal amount of daily sodium intake.  7. Foods to limit/avoid on a heart healthy diet.  Patient to follow-up with [facility] prn.  

## 2011-12-28 ENCOUNTER — Ambulatory Visit: Payer: 59 | Admitting: Internal Medicine

## 2011-12-31 ENCOUNTER — Ambulatory Visit: Payer: 59 | Admitting: Internal Medicine

## 2012-01-17 ENCOUNTER — Ambulatory Visit (INDEPENDENT_AMBULATORY_CARE_PROVIDER_SITE_OTHER)
Admission: RE | Admit: 2012-01-17 | Discharge: 2012-01-17 | Disposition: A | Discharge status: Home / Self Care | Payer: 59 | Source: Ambulatory Visit | Attending: Internal Medicine | Admitting: Internal Medicine

## 2012-01-17 ENCOUNTER — Encounter: Payer: Self-pay | Admitting: Internal Medicine

## 2012-01-17 ENCOUNTER — Ambulatory Visit (INDEPENDENT_AMBULATORY_CARE_PROVIDER_SITE_OTHER): Payer: 59 | Admitting: Internal Medicine

## 2012-01-17 VITALS — BP 132/86 | HR 79 | Temp 98.7°F | Resp 16 | Wt 151.0 lb

## 2012-01-17 DIAGNOSIS — I10 Essential (primary) hypertension: Secondary | ICD-10-CM

## 2012-01-17 DIAGNOSIS — K219 Gastro-esophageal reflux disease without esophagitis: Secondary | ICD-10-CM

## 2012-01-17 DIAGNOSIS — R29890 Loss of height: Secondary | ICD-10-CM

## 2012-01-17 MED ORDER — ESOMEPRAZOLE MAGNESIUM 40 MG PO CPDR: 40.0000 mg | DELAYED_RELEASE_CAPSULE | Freq: Every day | ORAL | Status: DC

## 2012-01-17 NOTE — Assessment & Plan Note (Signed)
Her BP is not high enough to start a med, she will improve her lifestyle modifications and will monitor her BP at home

## 2012-01-17 NOTE — Assessment & Plan Note (Signed)
She has tried an OTC PPI, H2 blocker, and tums without much relief so I have asked her to start nexium

## 2012-01-17 NOTE — Progress Notes (Signed)
Subjective:    Patient ID: Caitlin Burns, female    DOB: Aug 22, 1980, 31 y.o.   MRN: [id number]  Gastrophageal Reflux She complains of belching, heartburn and water brash. She reports no abdominal pain, no chest pain, no choking, no coughing, no dysphagia, no early satiety, no globus sensation, no hoarse voice, no nausea, no sore throat, no stridor, no tooth decay or no wheezing. This is a recurrent problem. The current episode started more than 1 month ago. The problem occurs frequently. The problem has been gradually worsening. The heartburn duration is several minutes. The heartburn is located in the substernum. The heartburn is of mild intensity. The heartburn does not wake her from sleep. The heartburn does not limit her activity. The heartburn doesn't change with position. The symptoms are aggravated by ETOH. Pertinent negatives include no anemia, fatigue, melena, muscle weakness, orthopnea or weight loss. She has tried a PPI and a histamine-2 antagonist (tums, pepcidc, otc prevacid) for the symptoms. The treatment provided mild relief.  Hypertension This is a chronic problem. The problem is unchanged. The problem is controlled. Associated symptoms include anxiety. Pertinent negatives include no blurred vision, chest pain, headaches, malaise/fatigue, neck pain, orthopnea, palpitations, peripheral edema, PND, shortness of breath or sweats. Agents associated with hypertension include estrogens (strattera). Past treatments include nothing. The current treatment provides mild improvement. Compliance problems include exercise and diet.       Review of Systems  Constitutional: Negative.  Negative for weight loss, malaise/fatigue and fatigue.  HENT: Negative.  Negative for sore throat, hoarse voice, trouble swallowing, neck pain and voice change.   Eyes: Negative.  Negative for blurred vision.  Respiratory: Negative for cough, choking, shortness of breath and wheezing.   Cardiovascular: Negative for  chest pain, palpitations, orthopnea and PND.  Gastrointestinal: Positive for heartburn. Negative for dysphagia, nausea, vomiting, abdominal pain, diarrhea, constipation, blood in stool and melena.  Genitourinary: Negative.   Musculoskeletal: Negative.  Negative for muscle weakness.  Skin: Negative.   Neurological: Negative.  Negative for headaches.  Hematological: Negative for adenopathy. Does not bruise/bleed easily.  Psychiatric/Behavioral: Negative.        Objective:   Physical Exam  Vitals reviewed. Constitutional: She is oriented to person, place, and time. She appears well-developed and well-nourished. No distress.  HENT:  Head: Normocephalic and atraumatic.  Mouth/Throat: Oropharynx is clear and moist. No oropharyngeal exudate.  Eyes: Conjunctivae normal are normal. Right eye exhibits no discharge. Left eye exhibits no discharge. No scleral icterus.  Neck: Normal range of motion. Neck supple. No JVD present. No tracheal deviation present. No thyromegaly present.  Cardiovascular: Normal rate, regular rhythm, normal heart sounds and intact distal pulses.  Exam reveals no gallop and no friction rub.   No murmur heard. Pulmonary/Chest: Effort normal and breath sounds normal. No stridor. No respiratory distress. She has no wheezes. She has no rales. She exhibits no tenderness.  Abdominal: Soft. Bowel sounds are normal. She exhibits no distension and no mass. There is no tenderness. There is no rebound and no guarding.  Musculoskeletal: Normal range of motion. She exhibits no edema and no tenderness.  Lymphadenopathy:    She has no cervical adenopathy.  Neurological: She is oriented to person, place, and time.  Skin: Skin is warm and dry. No rash noted. She is not diaphoretic. No erythema. No pallor.  Psychiatric: She has a normal mood and affect. Her behavior is normal. Judgment and thought content normal.     Lab Results  Component Value Date  WBC 5.0 07/10/2007   HGB 13.6  07/10/2007   HCT 39.5 07/10/2007   PLT 326 07/10/2007   GLUCOSE 87 10/30/2011   CHOL 194 10/30/2011   TRIG 76.0 10/30/2011   HDL 55.60 10/30/2011   LDLCALC 123* 10/30/2011   ALT 15 10/30/2011   AST 18 10/30/2011   NA 137 10/30/2011   K 4.1 10/30/2011   CL 103 10/30/2011   CREATININE 0.7 10/30/2011   BUN 12 10/30/2011   CO2 25 10/30/2011   TSH 1.225 04/06/2010   HGBA1C 4.9 10/30/2011       Assessment & Plan:

## 2012-01-17 NOTE — Patient Instructions (Signed)

## 2012-01-20 ENCOUNTER — Encounter: Payer: Self-pay | Admitting: Internal Medicine

## 2012-03-07 ENCOUNTER — Telehealth: Payer: Self-pay | Admitting: Internal Medicine

## 2012-03-07 DIAGNOSIS — K219 Gastro-esophageal reflux disease without esophagitis: Secondary | ICD-10-CM

## 2012-03-07 MED ORDER — ESOMEPRAZOLE MAGNESIUM 40 MG PO CPDR: 40.0000 mg | DELAYED_RELEASE_CAPSULE | Freq: Every day | ORAL | Status: DC

## 2012-03-07 NOTE — Telephone Encounter (Signed)
Patient needs the Nexium called in again to [facility] outpatient pharmacy, they no longer have it on file since it was never filled

## 2012-03-08 ENCOUNTER — Ambulatory Visit (INDEPENDENT_AMBULATORY_CARE_PROVIDER_SITE_OTHER): Payer: 59 | Admitting: Family Medicine

## 2012-03-08 ENCOUNTER — Encounter: Payer: Self-pay | Admitting: Family Medicine

## 2012-03-08 VITALS — BP 132/94 | Temp 98.8°F | Wt 148.0 lb

## 2012-03-08 DIAGNOSIS — R300 Dysuria: Secondary | ICD-10-CM

## 2012-03-08 DIAGNOSIS — R1013 Epigastric pain: Secondary | ICD-10-CM

## 2012-03-08 LAB — POCT URINALYSIS DIPSTICK
Glucose, UA: NEGATIVE
Leukocytes, UA: NEGATIVE
Nitrite, UA: NEGATIVE
Protein, UA: NEGATIVE
Spec Grav, UA: 1.015
Urobilinogen, UA: 0.2
pH, UA: 6.5

## 2012-03-08 MED ORDER — PROMETHAZINE HCL 25 MG PO TABS: 25.0000 mg | ORAL_TABLET | ORAL | Status: DC | PRN

## 2012-03-08 NOTE — Progress Notes (Signed)
  Subjective:    Patient ID: Caitlin Burns, female    DOB: Sep 05, 1980, 31 y.o.   MRN: [id number]  HPI Here for worsening epigastric pain and nausea. This started about 2 months ago, and she was started on Nexium once a day every am. This helped for awhile, but now she is getting worse again. She has burning in the chest and belching, and she has a deep aching pain in the epigastrium. She is nauseated frequently but does not vomit. She has little appetite. Her BMs are regular but they have been darker than normal. No fevers.    Review of Systems  Constitutional: Negative.   Respiratory: Negative.   Cardiovascular: Positive for chest pain. Negative for palpitations and leg swelling.  Gastrointestinal: Positive for nausea and abdominal pain. Negative for vomiting, diarrhea, constipation, blood in stool and abdominal distention.  Genitourinary: Negative.        Objective:   Physical Exam  Constitutional: She appears well-developed and well-nourished.  Cardiovascular: Normal rate, regular rhythm, normal heart sounds and intact distal pulses.   Pulmonary/Chest: Effort normal.  Abdominal: Soft. Bowel sounds are normal. She exhibits no distension and no mass. There is no rebound and no guarding.       Mildly tender in the epigastrium           Assessment & Plan:  This is consistent with duodenitis or possible duodenal ulcers. Gall bladder issues are also a possibility. Advised her over the weekend to increase the Nexium to bid, and to supplement this with Mylanta 4 times a day. Follow up with Dr. Debby Bud next week. She may need an abdominal US to look at her GB, or she may need upper endoscopy. Try Phenergan for nausea.

## 2012-03-10 ENCOUNTER — Telehealth: Payer: Self-pay | Admitting: Internal Medicine

## 2012-03-10 DIAGNOSIS — K219 Gastro-esophageal reflux disease without esophagitis: Secondary | ICD-10-CM

## 2012-03-10 DIAGNOSIS — K921 Melena: Secondary | ICD-10-CM

## 2012-03-10 NOTE — Telephone Encounter (Signed)
Pt saw Dr. Clent Ridges Saturday.  He told her to contact Dr. Debby Bud to schedule an Endoscopy.

## 2012-03-10 NOTE — Telephone Encounter (Signed)
PATIENT CALLED REQUEST A REFERRAL TO  ENDOSCOPY. SAW DR. Clent Ridges ON Saturday AND RECCOMMENDED SHE HAVE THIS.

## 2012-03-11 ENCOUNTER — Telehealth: Payer: Self-pay | Admitting: Gastroenterology

## 2012-03-11 ENCOUNTER — Encounter: Payer: Self-pay | Admitting: Gastroenterology

## 2012-03-11 NOTE — Telephone Encounter (Signed)
Pt has been added to [facility] schedule for 130 pm 03/12/12  Dr Debby Bud office is aware and will call with any questions

## 2012-03-11 NOTE — Telephone Encounter (Signed)
[facility] notified to schedule appontment with GI. Continue bid dosing of nexium. Call for immediate eval for dark or black stools or for hemetemesis.

## 2012-03-11 NOTE — Telephone Encounter (Signed)
If she is having dark,black stools she will need to be seen today: come to office for stat H/H and OV (will work her in)

## 2012-03-11 NOTE — Telephone Encounter (Signed)
Called patient - last black stool sat morning - no BM since. She still can't eat due to pain.  Plan- either to be seen tomorrow in GI (PA is fine) or OV with me. Spoke with D Dawkins who is working on the appointment

## 2012-03-11 NOTE — Telephone Encounter (Signed)
Patient notified of [facility] sending referral to GI office now . [name], [facility], notified of patient request to schedule ASAP . Patient states is having dark and black stools and has not eat in several days, to please do ASAP, patient work # [phone].

## 2012-03-12 ENCOUNTER — Other Ambulatory Visit (INDEPENDENT_AMBULATORY_CARE_PROVIDER_SITE_OTHER): Payer: 59

## 2012-03-12 ENCOUNTER — Ambulatory Visit (INDEPENDENT_AMBULATORY_CARE_PROVIDER_SITE_OTHER): Payer: 59 | Admitting: Physician Assistant

## 2012-03-12 ENCOUNTER — Encounter: Payer: Self-pay | Admitting: Physician Assistant

## 2012-03-12 VITALS — BP 122/82 | HR 88 | Ht 66.0 in | Wt 148.0 lb

## 2012-03-12 DIAGNOSIS — R110 Nausea: Secondary | ICD-10-CM

## 2012-03-12 DIAGNOSIS — R634 Abnormal weight loss: Secondary | ICD-10-CM

## 2012-03-12 DIAGNOSIS — R1013 Epigastric pain: Secondary | ICD-10-CM

## 2012-03-12 LAB — CBC WITH DIFFERENTIAL/PLATELET
Basophils Absolute: 0.1 10*3/uL (ref 0.0–0.1)
Basophils Relative: 0.7 % (ref 0.0–3.0)
Eosinophils Absolute: 0.1 10*3/uL (ref 0.0–0.7)
Eosinophils Relative: 1.1 % (ref 0.0–5.0)
HCT: 42.6 % (ref 36.0–46.0)
Hemoglobin: 14.4 g/dL (ref 12.0–15.0)
Lymphocytes Relative: 23.7 % (ref 12.0–46.0)
Lymphs Abs: 1.9 10*3/uL (ref 0.7–4.0)
MCHC: 33.8 g/dL (ref 30.0–36.0)
MCV: 88 fl (ref 78.0–100.0)
Monocytes Absolute: 0.6 10*3/uL (ref 0.1–1.0)
Monocytes Relative: 7.3 % (ref 3.0–12.0)
Neutro Abs: 5.4 10*3/uL (ref 1.4–7.7)
Neutrophils Relative %: 67.2 % (ref 43.0–77.0)
Platelets: 294 10*3/uL (ref 150.0–400.0)
RBC: 4.84 Mil/uL (ref 3.87–5.11)
RDW: 11.8 % (ref 11.5–14.6)
WBC: 8 10*3/uL (ref 4.5–10.5)

## 2012-03-12 LAB — HIGH SENSITIVITY CRP: CRP, High Sensitivity: 1.06 mg/dL (ref 0.000–5.000)

## 2012-03-12 LAB — COMPREHENSIVE METABOLIC PANEL
ALT: 12 U/L (ref 0–35)
AST: 15 U/L (ref 0–37)
Albumin: 4.4 g/dL (ref 3.5–5.2)
Alkaline Phosphatase: 48 U/L (ref 39–117)
BUN: 10 mg/dL (ref 6–23)
CO2: 27 mEq/L (ref 19–32)
Calcium: 9.3 mg/dL (ref 8.4–10.5)
Chloride: 98 mEq/L (ref 96–112)
Creatinine, Ser: 0.7 mg/dL (ref 0.4–1.2)
GFR: 101.99 mL/min (ref >60.00)
Glucose, Bld: 116 mg/dL — ABNORMAL HIGH (ref 70–99)
Potassium: 4 mEq/L (ref 3.5–5.1)
Sodium: 135 mEq/L (ref 135–145)
Total Bilirubin: 0.9 mg/dL (ref 0.3–1.2)
Total Protein: 8.3 g/dL (ref 6.0–8.3)

## 2012-03-12 NOTE — Progress Notes (Signed)
Agree with initial assessment and plans as outlined. Will likely need an upper endoscopy.

## 2012-03-12 NOTE — Patient Instructions (Addendum)
Please go to the basement level to have your labs drawn.   You have been scheduled for an endoscopy with propofol. Please follow written instructions given to you at your visit today. If you use inhalers (even only as needed) or a CPAP machine, please bring them with you on the day of your procedure. You have been scheduled for a CT scan of the abdomen and pelvis at [facility] ([address]---this is in the same building as Architectural technologist).   You are scheduled on 03-13-2012 at 3:00 PM . You should arrive at 2:45 PM to your appointment time for registration. Please follow the written instructions below on the day of your exam:  WARNING: IF YOU ARE ALLERGIC TO IODINE/X-RAY DYE, PLEASE NOTIFY RADIOLOGY IMMEDIATELY AT [phone]! YOU WILL BE GIVEN A 13 HOUR PREMEDICATION PREP.  1) Do not eat or drink anything after 11:00 AM (4 hours prior to your test) 2) You have been given 2 bottles of oral contrast to drink. The solution may taste better if refrigerated, but do NOT add ice or any other liquid to this solution. Shake  well before drinking.    Drink 1 bottle of contrast @ 1:00 PM  (2 hours prior to your exam)  Drink 1 bottle of contrast @ 2:00 pM  (1 hour prior to your exam)  You may take any medications as prescribed with a small amount of water except for the following: Metformin, Glucophage, Glucovance, Avandamet, Riomet, Fortamet, Actoplus Met, Janumet, Glumetza or Metaglip. The above medications must be held the day of the exam AND 48 hours after the exam.  The purpose of you drinking the oral contrast is to aid in the visualization of your intestinal tract. The contrast solution may cause some diarrhea. Before your exam is started, you will be given a small amount of fluid to drink. Depending on your individual set of symptoms, you may also receive an intravenous injection of x-ray contrast/dye. Plan on being at [facility] for 30 minutes or long, depending on the  type of exam you are having performed.  This test typically takes 30-45 minutes to complete.  If you have any questions regarding your exam or if you need to reschedule, you may call the CT department at [phone] between the hours of 8:00 am and 5:00 pm, Monday-Friday.  ________________________________________________________________________

## 2012-03-12 NOTE — Progress Notes (Signed)
Subjective:    Patient ID: Caitlin Burns, female    DOB: February 06, 1981, 31 y.o.   MRN: [id number]  HPI Caitlin Burns is a pleasant 31 year old white female primary patient of Dr. Debby Bud who is new to GI today. She has history of ADHD, chronic anxiety, and asthma. She is referred to GI for evaluation of new onset of upper abdominal pain and nausea. Patient says she has been having trouble over the past couple of months with upper abdominal discomfort and frequent nausea without vomiting. She had a couple of episodes of very dark stools last week, and actually went to see Dr. Clent Ridges over the weekend. She had been placed on Nexium 40 mg once daily in August of 2013 after her symptoms started in her Nexium dose was increased to twice a day this past weekend. She did not have any labs done at that time. She states she had not been taking any Pepto-Bismol but that her stools are back to normal over the past 4 or 5 days. She complains of a severe burning sensation in her chest and a stomach ache which has been constant over the past week. She has not had any vomiting, she denies any dysphagia but has been having some mild odynophagia. She denies any regular use of aspirin or NSAIDs, no EtOH, no changes in her regular medications and no recent antibiotics. She does admit to being very stressed emotionally recently and has "a lot going on". Thus far with the Nexium she does not feel any better, she says she's been needing very little and her appetite is poor and her weight is down about 8 pounds over the past couple of weeks. She has not had any prior GI problems. Family history is negative for inflammatory bowel disease and colon cancers; her father does have history of ulcers.    Review of Systems  Constitutional: Positive for appetite change and unexpected weight change.  Eyes: Negative.   Respiratory: Negative.   Cardiovascular: Negative.   Gastrointestinal: Positive for nausea and abdominal pain.  Genitourinary:  Negative.   Musculoskeletal: Negative.   Skin: Negative.   Neurological: Negative.   Hematological: Negative.   Psychiatric/Behavioral: The patient is nervous/anxious.    Outpatient Prescriptions Prior to Visit  Medication Sig Dispense Refill  . albuterol (PROVENTIL HFA;VENTOLIN HFA) 108 (90 BASE) MCG/ACT inhaler Inhale 2 puffs into the lungs every 6 (six) hours as needed.      . ALPRAZolam (XANAX) 0.25 MG tablet Take 0.25 mg by mouth 2 (two) times daily as needed.        Marland Kitchen esomeprazole (NEXIUM) 40 MG capsule Take 1 capsule (40 mg total) by mouth daily.  30 capsule  5  . promethazine (PHENERGAN) 25 MG tablet Take 1 tablet (25 mg total) by mouth every 4 (four) hours as needed for nausea.  60 tablet  0       Allergies  Allergen Reactions  . Aspirin     REACTION: PROBLEMS BREATHING  . Sulfonamide Derivatives     REACTION: HIVES   Patient Active Problem List  Diagnosis  . DEPRESSION  . VISUAL CHANGES  . Essential hypertension, benign  . BACK PAIN  . Bunion  . Headache  . Palpitations  . Dysuria  . ATTENTION DEFICIT HYPERACTIVITY DISORDER, HX OF  . RAYNAUD'S SYNDROME, HX OF  . ECHOCARDIOGRAM, ABNORMAL  . Preventative health care  . Bilateral hearing loss  . Asthma  . Anxiety  . Injury of shoulder, right  . Hip pain, bilateral  .  Chondromalacia of both patellae  . GERD (gastroesophageal reflux disease)   History  Substance Use Topics  . Smoking status: Never Smoker   . Smokeless tobacco: Never Used  . Alcohol Use: Yes     Comment: rare    Objective:   Physical Exam well-developed young white female in no acute distress, blood pressure 122/82 pulse 88 height 5 foot 6 weight 148. HEENT; nontraumatic normocephalic EOMI PERRLA sclera anicteric,Neck; Supple no JVD, Cardiovascular; regular rate and rhythm with S1-S2 no murmur or gallop, Pulmonary; clear bilaterally, Abdomen; soft she is tender in the epigastrium and also tender in the right lower quadrant, there some  fullness in the right lower quadrant no palpable mass or hepatosplenomegaly, no rebound, bowel sounds are present, Rectal; exam not done, Extremities; no clubbing cyanosis or edema skin warm and dry, Psych; mood and affect normal and appropriate.        Assessment & Plan:  #56  31 year old female with 3 month history of epigastric pain and nausea, with her pain being HTN burning in nature and now progressing to more constant heartburn. She has had an 8 pound weight loss over the past 2 weeks, and describes transient dark stools which have cleared. Symptoms are not being relieved by PPI therapy. Etiology of her symptoms is not clear, will need to rule out peptic ulcer disease, gastritis, esophagitis and also need to consider IBD as on exam she is really more tender in her right lower abdomen.  #2 chronic anxiety #3 ADHD  Plan; CBC with differential, CRP and CMET today We'll schedule for CT scan of the abdomen and pelvis. Will also schedule for upper endoscopy with Dr. Marina Goodell; procedure was discussed in detail with patient and she is agreeable to proceed-this can be canceled if CT scan shows other issue Continue Nexium 40 mg by mouth twice daily for now Add Zofran 4 mg every 6 hours as needed for nausea. Further plans pending results of labs and CT

## 2012-03-13 ENCOUNTER — Ambulatory Visit (INDEPENDENT_AMBULATORY_CARE_PROVIDER_SITE_OTHER)
Admission: RE | Admit: 2012-03-13 | Discharge: 2012-03-13 | Disposition: A | Discharge status: Home / Self Care | Payer: 59 | Source: Ambulatory Visit | Attending: Physician Assistant | Admitting: Physician Assistant

## 2012-03-13 ENCOUNTER — Ambulatory Visit: Payer: 59 | Admitting: Physician Assistant

## 2012-03-13 DIAGNOSIS — R634 Abnormal weight loss: Secondary | ICD-10-CM

## 2012-03-13 DIAGNOSIS — R1013 Epigastric pain: Secondary | ICD-10-CM

## 2012-03-13 DIAGNOSIS — R110 Nausea: Secondary | ICD-10-CM

## 2012-03-13 MED ORDER — IOHEXOL 300 MG/ML  SOLN
100.0000 mL | Freq: Once | INTRAMUSCULAR | Status: AC | PRN
  Administered 2012-03-13: 100 mL via INTRAVENOUS

## 2012-03-14 ENCOUNTER — Other Ambulatory Visit: Payer: Self-pay | Admitting: Surgery

## 2012-03-18 ENCOUNTER — Ambulatory Visit: Payer: 59 | Admitting: Gastroenterology

## 2012-04-10 ENCOUNTER — Encounter: Payer: 59 | Admitting: Internal Medicine

## 2012-04-10 ENCOUNTER — Ambulatory Visit (AMBULATORY_SURGERY_CENTER): Payer: 59 | Admitting: Internal Medicine

## 2012-04-10 ENCOUNTER — Encounter: Payer: Self-pay | Admitting: Internal Medicine

## 2012-04-10 VITALS — BP 103/76 | HR 62 | Resp 17 | Ht 66.0 in | Wt 148.0 lb

## 2012-04-10 DIAGNOSIS — R1013 Epigastric pain: Secondary | ICD-10-CM

## 2012-04-10 DIAGNOSIS — R634 Abnormal weight loss: Secondary | ICD-10-CM

## 2012-04-10 DIAGNOSIS — D130 Benign neoplasm of esophagus: Secondary | ICD-10-CM

## 2012-04-10 DIAGNOSIS — R110 Nausea: Secondary | ICD-10-CM

## 2012-04-10 DIAGNOSIS — K219 Gastro-esophageal reflux disease without esophagitis: Secondary | ICD-10-CM

## 2012-04-10 MED ORDER — SODIUM CHLORIDE 0.9 % IV SOLN: 500.0000 mL | INTRAVENOUS | Status: DC

## 2012-04-10 MED ORDER — DEXLANSOPRAZOLE 60 MG PO CPDR: 60.0000 mg | DELAYED_RELEASE_CAPSULE | Freq: Every day | ORAL | Status: DC

## 2012-04-10 NOTE — Patient Instructions (Addendum)
YOU HAD AN ENDOSCOPIC PROCEDURE TODAY AT THE [facility]: Refer to the procedure report that was given to you for any specific questions about what was found during the examination.  If the procedure report does not answer your questions, please call your gastroenterologist to clarify.  If you requested that your care partner not be given the details of your procedure findings, then the procedure report has been included in a sealed envelope for you to review at your convenience later.  YOU SHOULD EXPECT: Some feelings of bloating in the abdomen. Passage of more gas than usual.  Walking can help get rid of the air that was put into your GI tract during the procedure and reduce the bloating. If you had a lower endoscopy (such as a colonoscopy or flexible sigmoidoscopy) you may notice spotting of blood in your stool or on the toilet paper. If you underwent a bowel prep for your procedure, then you may not have a normal bowel movement for a few days.  DIET: Your first meal following the procedure should be a light meal and then it is ok to progress to your normal diet.  A half-sandwich or bowl of soup is an example of a good first meal.  Heavy or fried foods are harder to digest and may make you feel nauseous or bloated.  Likewise meals heavy in dairy and vegetables can cause extra gas to form and this can also increase the bloating.  Drink plenty of fluids but you should avoid alcoholic beverages for 24 hours.  ACTIVITY: Your care partner should take you home directly after the procedure.  You should plan to take it easy, moving slowly for the rest of the day.  You can resume normal activity the day after the procedure however you should NOT DRIVE or use heavy machinery for 24 hours (because of the sedation medicines used during the test).    SYMPTOMS TO REPORT IMMEDIATELY: A gastroenterologist can be reached at any hour.  During normal business hours, 8:30 AM to 5:00 PM Monday through Friday,  call [phone].  After hours and on weekends, please call the GI answering service at [phone] who will take a message and have the physician on call contact you.  Following upper endoscopy (EGD)  Vomiting of blood or coffee ground material  New chest pain or pain under the shoulder blades  Painful or persistently difficult swallowing  New shortness of breath  Fever of 100F or higher  Black, tarry-looking stools  FOLLOW UP: If any biopsies were taken you will be contacted by phone or by letter within the next 1-3 weeks.  Call your gastroenterologist if you have not heard about the biopsies in 3 weeks.  Our staff will call the home number listed on your records the next business day following your procedure to check on you and address any questions or concerns that you may have at that time regarding the information given to you following your procedure. This is a courtesy call and so if there is no answer at the home number and we have not heard from you through the emergency physician on call, we will assume that you have returned to your regular daily activities without incident.  SIGNATURES/CONFIDENTIALITY: You and/or your care partner have signed paperwork which will be entered into your electronic medical record.  These signatures attest to the fact that that the information above on your After Visit Summary has been reviewed and is understood.  Full responsibility of   the confidentiality of this discharge information lies with you and/or your care-partner. 

## 2012-04-10 NOTE — Progress Notes (Signed)
Patient did not have preoperative order for IV antibiotic SSI prophylaxis. (G8918)  Patient did not experience any of the following events: a burn prior to discharge; a fall within the facility; wrong site/side/patient/procedure/implant event; or a hospital transfer or hospital admission upon discharge from the facility. (G8907)  

## 2012-04-10 NOTE — Op Note (Signed)
[facility] [address] [place], [address]   ENDOSCOPY PROCEDURE REPORT  PATIENT: Caitlin, Burns  MR#: [id number] BIRTHDATE: Apr 22, 1981 , 31  yrs. old GENDER: Female ENDOSCOPIST: Roxy Cedar, MD REFERRED BY:  Jacques Navy, M.D. PROCEDURE DATE:  04/10/2012 PROCEDURE:  EGD w/ biopsy ASA CLASS:     Class I INDICATIONS:  Epigastric pain.   Nausea.   Heartburn despite BID Nexium, vague dysphagia. MEDICATIONS: MAC sedation, administered by CRNA and propofol (Diprivan) 300mg  IV TOPICAL ANESTHETIC: Cetacaine Spray  DESCRIPTION OF PROCEDURE: After the risks benefits and alternatives of the procedure were thoroughly explained, informed consent was obtained.  The LB GIF-H180 [id number] endoscope was introduced through the mouth and advanced to the second portion of the duodenum. Without limitations.  The instrument was slowly withdrawn as the mucosa was fully examined.      The upper, middle and distal third of the esophagus were carefully inspected and no abnormalities were noted.  The z-line was well seen at the GEJ.  The endoscope was pushed into the fundus which was normal including a retroflexed view.  The antrum, gastric body, first and second part of the duodenum were unremarkable. Retroflexed views revealed no abnormalities.  Bx x 4 of distal and mid esophagus to r/o EOE.   The scope was then withdrawn from the patient and the procedure completed.  COMPLICATIONS: There were no complications. ENDOSCOPIC IMPRESSION: 1. Normal EGD 2. GERD  RECOMMENDATIONS: 1.  Anti-reflux regimen to be followed 2.  Continue PPI... WILL CHANGE TO DEXILANT 60MG  DAILY;#30; 11 REFILLS 3.  Follow up biopsies 4.  Call office next 2-3 days to schedule an office appointment for 4-6 WEEKS  REPEAT EXAM:  eSigned:  Roxy Cedar, MD 04/10/2012 2:25 PM  XB:[id number] Esther Hardy, MD and The Patient

## 2012-04-11 ENCOUNTER — Telehealth: Payer: Self-pay | Admitting: *Deleted

## 2012-04-11 NOTE — Telephone Encounter (Signed)
  Follow up Call-  Call back number 04/10/2012  Post procedure Call Back phone  # [phone]  Permission to leave phone message Yes     Left message on answering machine to call us back if experiencing problems or has questions

## 2012-04-15 ENCOUNTER — Encounter: Payer: Self-pay | Admitting: Internal Medicine

## 2012-05-07 NOTE — L&D Delivery Note (Signed)
Delivery Note At 8:48 PM a viable and healthy female was delivered via Vaginal, Spontaneous Delivery (Presentation: ROA ).  APGAR:8 ,9 ; weight pending. Placenta status: spontaneous, intact.  Cord:  with the following complications: none.  Cord pH: na  Anesthesia: Epidural  Episiotomy: none Lacerations: second Suture Repair: 2.0 vicryl rapide Est. Blood Loss (mL): 200  Mom to postpartum.  Baby to nursery-stable.  TAAVON,RICHARD J 01/26/2013, 9:02 PM

## 2012-05-14 ENCOUNTER — Ambulatory Visit: Payer: 59 | Admitting: Internal Medicine

## 2012-07-07 LAB — OB RESULTS CONSOLE RUBELLA ANTIBODY, IGM: Rubella: IMMUNE

## 2012-07-07 LAB — OB RESULTS CONSOLE ABO/RH: RH Type: POSITIVE

## 2012-07-07 LAB — OB RESULTS CONSOLE HIV ANTIBODY (ROUTINE TESTING): HIV: NONREACTIVE

## 2012-07-07 LAB — OB RESULTS CONSOLE ANTIBODY SCREEN: Antibody Screen: NEGATIVE

## 2012-07-07 LAB — OB RESULTS CONSOLE RPR: RPR: NONREACTIVE

## 2012-07-07 LAB — OB RESULTS CONSOLE HEPATITIS B SURFACE ANTIGEN: Hepatitis B Surface Ag: NEGATIVE

## 2012-07-24 LAB — OB RESULTS CONSOLE GC/CHLAMYDIA
Chlamydia: NEGATIVE
Gonorrhea: NEGATIVE

## 2012-10-29 ENCOUNTER — Encounter (HOSPITAL_COMMUNITY): Payer: Self-pay | Admitting: *Deleted

## 2012-10-29 ENCOUNTER — Inpatient Hospital Stay (HOSPITAL_COMMUNITY): Payer: 59

## 2012-10-29 ENCOUNTER — Inpatient Hospital Stay (HOSPITAL_COMMUNITY)
Admission: AD | Admit: 2012-10-29 | Discharge: 2012-10-30 | DRG: 781 | Disposition: A | Discharge status: Home / Self Care | Payer: 59 | Source: Ambulatory Visit | Attending: Obstetrics and Gynecology | Admitting: Obstetrics and Gynecology

## 2012-10-29 DIAGNOSIS — D72829 Elevated white blood cell count, unspecified: Secondary | ICD-10-CM | POA: Diagnosis present

## 2012-10-29 DIAGNOSIS — O26839 Pregnancy related renal disease, unspecified trimester: Principal | ICD-10-CM | POA: Diagnosis present

## 2012-10-29 DIAGNOSIS — N1330 Unspecified hydronephrosis: Secondary | ICD-10-CM | POA: Diagnosis present

## 2012-10-29 DIAGNOSIS — Z87442 Personal history of urinary calculi: Secondary | ICD-10-CM

## 2012-10-29 DIAGNOSIS — N23 Unspecified renal colic: Secondary | ICD-10-CM | POA: Diagnosis present

## 2012-10-29 DIAGNOSIS — N3090 Cystitis, unspecified without hematuria: Secondary | ICD-10-CM | POA: Diagnosis present

## 2012-10-29 LAB — CBC
HCT: 30 % — ABNORMAL LOW (ref 36.0–46.0)
Hemoglobin: 10.9 g/dL — ABNORMAL LOW (ref 12.0–15.0)
MCH: 30.4 pg (ref 26.0–34.0)
MCHC: 36.3 g/dL — ABNORMAL HIGH (ref 30.0–36.0)
MCV: 83.6 fL (ref 78.0–100.0)
Platelets: 203 10*3/uL (ref 150–400)
RBC: 3.59 MIL/uL — ABNORMAL LOW (ref 3.87–5.11)
RDW: 12.5 % (ref 11.5–15.5)
WBC: 14.4 10*3/uL — ABNORMAL HIGH (ref 4.0–10.5)

## 2012-10-29 LAB — COMPREHENSIVE METABOLIC PANEL
ALT: 8 U/L (ref 0–35)
AST: 18 U/L (ref 0–37)
Albumin: 3 g/dL — ABNORMAL LOW (ref 3.5–5.2)
Alkaline Phosphatase: 52 U/L (ref 39–117)
BUN: 13 mg/dL (ref 6–23)
CO2: 22 mEq/L (ref 19–32)
Calcium: 8.9 mg/dL (ref 8.4–10.5)
Chloride: 97 mEq/L (ref 96–112)
Creatinine, Ser: 0.58 mg/dL (ref 0.50–1.10)
GFR calc Af Amer: >90 mL/min (ref >90)
GFR calc non Af Amer: >90 mL/min (ref >90)
Glucose, Bld: 122 mg/dL — ABNORMAL HIGH (ref 70–99)
Potassium: 3.6 mEq/L (ref 3.5–5.1)
Sodium: 132 mEq/L — ABNORMAL LOW (ref 135–145)
Total Bilirubin: 0.2 mg/dL — ABNORMAL LOW (ref 0.3–1.2)
Total Protein: 6.6 g/dL (ref 6.0–8.3)

## 2012-10-29 MED ORDER — DEXTROSE 5 % IV SOLN
1.0000 g | Freq: Two times a day (BID) | INTRAVENOUS | Status: DC
  Administered 2012-10-29 – 2012-10-30 (×3): 1 g via INTRAVENOUS
  Filled 2012-10-29 (×3): qty 10

## 2012-10-29 MED ORDER — PROMETHAZINE HCL 25 MG/ML IJ SOLN
12.5000 mg | Freq: Four times a day (QID) | INTRAMUSCULAR | Status: DC | PRN
  Administered 2012-10-29: 12.5 mg via INTRAVENOUS
  Filled 2012-10-29: qty 1

## 2012-10-29 MED ORDER — BUTORPHANOL TARTRATE 1 MG/ML IJ SOLN
1.0000 mg | INTRAMUSCULAR | Status: DC | PRN
Start: 2012-10-29 — End: 2012-10-30
  Administered 2012-10-29 – 2012-10-30 (×3): 1 mg via INTRAVENOUS
  Filled 2012-10-29 (×3): qty 1

## 2012-10-29 MED ORDER — SODIUM CHLORIDE 0.9 % IV SOLN
INTRAVENOUS | Status: DC
  Administered 2012-10-29 – 2012-10-30 (×4): via INTRAVENOUS

## 2012-10-29 MED ORDER — BUTORPHANOL TARTRATE 1 MG/ML IJ SOLN
INTRAMUSCULAR | Status: AC
  Administered 2012-10-29: 1 mg
  Filled 2012-10-29: qty 1

## 2012-10-29 MED ORDER — DEXTROSE 5 % IV SOLN: 2.0000 g | INTRAVENOUS | Status: DC

## 2012-10-29 MED ORDER — LACTATED RINGERS IV SOLN
INTRAVENOUS | Status: DC
Start: 2012-10-29 — End: 2012-10-29
  Administered 2012-10-29: 02:00:00 via INTRAVENOUS

## 2012-10-29 MED ORDER — BUTORPHANOL TARTRATE 1 MG/ML IJ SOLN
1.0000 mg | INTRAMUSCULAR | Status: DC | PRN
  Administered 2012-10-29 (×2): 1 mg via INTRAVENOUS
  Filled 2012-10-29 (×2): qty 1

## 2012-10-29 MED ORDER — ONDANSETRON HCL 4 MG/2ML IJ SOLN
4.0000 mg | Freq: Three times a day (TID) | INTRAMUSCULAR | Status: DC
  Administered 2012-10-29 – 2012-10-30 (×4): 4 mg via INTRAVENOUS
  Filled 2012-10-29 (×4): qty 2

## 2012-10-29 NOTE — MAU Note (Signed)
Pt states she has had a kidney stone before about 1 1/2 year ago. Pt has never been diagnosed, has never seen a kidney stone

## 2012-10-29 NOTE — H&P (Signed)
Caitlin Burns, Caitlin Burns              ACCOUNT NO.:  [id number]  MEDICAL RECORD NO.:  [id number]  LOCATION:  9159                          FACILITY:  [facility]  PHYSICIAN:  Lenoard Aden, M.D.DATE OF BIRTH:  [birth year]  DATE OF ADMISSION:  10/29/2012 DATE OF DISCHARGE:                             HISTORY & PHYSICAL   CHIEF COMPLAINT:  Worsening flank pain.  HISTORY OF PRESENT ILLNESS:  This is a 32 year old white female, G1, P0, at 48 and 6/7th weeks gestation, who had a 3-day history of right flank pain, hematuria, nausea, urgency, and dysuria.  She was seen in the office 1 day ago and prescribed Vicodin, Flomax, and Keflex prophylactically for which she has now failed outpatient therapy and presents with increased nausea and flank pain for inpatient admission. She has no other pregnancy related complications.  She reports good fetal movement.  Denies contractions, bleeding, leakage of fluid.  ALLERGIES:  She has allergies to sulfa and aspirin.  PAST MEDICAL HISTORY:  She has history of anal fissures, ADHD, ovarian cyst, asthma, migraine headaches, and kidney stones.  SOCIAL HISTORY:  She is a nonsmoker, nondrinker.  She denies domestic or physical violence.  MEDICATIONS:  Prenatal vitamins, Protonix, Flomax, and Vicodin.  FAMILY HISTORY:  She has a history of asthma, heart disease, lung cancer, migraine headaches, aneurysm, diabetes, epilepsy, congestive heart failure, varicose vein, and kidney stones.  PREGNANCY HISTORY:  Noncontributory.  SURGICAL HISTORY:  Noncontributory.  PHYSICAL EXAMINATION:  GENERAL:  She is a well-developed, well- nourished, white female, in no acute distress. VITAL SIGNS:  Stable.  Patient is afebrile. HEENT:  Normal. NECK:  Supple.  Full range of motion. LUNGS:  Clear. HEART:  Regular rate and rhythm. ABDOMEN:  Soft, gravid, nontender.  Fetal heart tones 145 beats per minute.  There is right-sided flank tenderness noted extending around the  lower abdominal quadrant. NEUROLOGIC:  Nonfocal. SKIN:  Intact. PELVIC:  Deferred.  Done previously in the office 1 day ago.  LABORATORY DATA:  CBC reveals leukocytosis, white blood cell count of 14,000.  IMPRESSION: 1. Acute renal colic with a history of urolithiasis with probable     recurrent kidney stone. 2. Leukocytosis with questionable etiology, questionable complicated     cystitis and possible pyelonephritis.  PLAN:  Admit, IV antibiotics, renal ultrasound, IV fluid hydration, analgesia.  Patient acknowledges and we will proceed with admission.     Lenoard Aden, M.D.     RJT/MEDQ  D:  10/29/2012  T:  10/29/2012  Job:  [id number]

## 2012-10-29 NOTE — MAU Note (Signed)
PT SAYS SHE STARTED HURTING YESTERDAY-   THEN MORE TODAY-  THEN TONIGHT  AT  8PM-  TOOK HYDROCODONE-   THAT MADE HER SICK.Marland Kitchen VOMITED AT 1030-  THEN AGAIN IN MAU.     PT HAS R LOWER BACK PAIN AND LOWER ABD - STARTED AT 8 PM    .  PT THINKS SHE HAS HAD STONES BEFORE- BUT NONE HAVE EVE BEEN SEEN.

## 2012-10-29 NOTE — Progress Notes (Signed)
Patient ID: Caitlin Burns, female   DOB: January 07, 1981, 32 y.o.   MRN: [id number] Pain is improved today. Had Stadol at 1900. ? Passed some "gravel" in urine.  BP 95/52  Pulse 82  Temp(Src) 98.4 F (36.9 C) (Oral)  Resp 18  Ht 5\' 6"  (1.676 m)  Wt 73.483 kg (162 lb)  BMI 26.16 kg/m2  LMP 03/12/2012  No CVAT Pain in lower back to flank and slightly anterior FHTs 145  IMP: Flank pain- ? Distal stone Continue IV ABX Rpt CBC in am

## 2012-10-29 NOTE — Progress Notes (Signed)
Ur chart review completed.  

## 2012-10-29 NOTE — Progress Notes (Signed)
Presents with nausea, vomiting and right flank painn Seen in office 6/24 with hematuria and flank pain. Treated with pain meds and Flomax. Renal sono was pending.  Denies fever or chills. BP 138/82  Pulse 74  Temp(Src) 99 F (37.2 C) (Oral)  Resp 18  Ht 5\' 6"  (1.676 m)  Wt 72.576 kg (160 lb)  BMI 25.84 kg/m2  LMP 03/12/2012  Lungs CTA No CVAT Ext neg c/c /e CBC    Component Value Date/Time   WBC 14.4* 10/29/2012 0205   RBC 3.59* 10/29/2012 0205   HGB 10.9* 10/29/2012 0205   HCT 30.0* 10/29/2012 0205   PLT 203 10/29/2012 0205   MCV 83.6 10/29/2012 0205   MCH 30.4 10/29/2012 0205   MCHC 36.3* 10/29/2012 0205   RDW 12.5 10/29/2012 0205   LYMPHSABS 1.9 03/12/2012 1456   MONOABS 0.6 03/12/2012 1456   EOSABS 0.1 03/12/2012 1456   BASOSABS 0.1 03/12/2012 1456    Imp: Renal colic- history of nephrolithiasis Leukocytocis P  Analgesia Renal sono Iv abx IVF

## 2012-10-29 NOTE — Progress Notes (Signed)
Patient ID: Caitlin Burns, female   DOB: November 07, 1980, 32 y.o.   MRN: [id number] HD#1 S: Still with flank pain now lower and increase in dysuria. Hematuria noted. Good FM . No contractions No fever , no chills. Nausea improved. Pain stable.  O: BP 112/62  Pulse 62  Temp(Src) 98.9 F (37.2 C) (Oral)  Resp 20  Ht 5\' 6"  (1.676 m)  Wt 72.576 kg (160 lb)  BMI 25.84 kg/m2  LMP 03/12/2012  Lgs: CTA CV: RRR ABD: gravid , NT No CVAT Pain in right flank, now lower and moving anterior Neuro: sleepy, non focal Skin: intact  Renal sono: Mild right hydronephrosis, no stone seen  IMP: Renal colic- likely distal stone. No evidence of obstruction. Leukocytosis- ?etx. ? Complicated cystitis, doubt Pyelo  P: IVF Analgesia Ceftriaxone If no improved in 24hrs, consider Urology consult and poss CT. CBC in am.

## 2012-10-30 LAB — CBC
HCT: 25.5 % — ABNORMAL LOW (ref 36.0–46.0)
Hemoglobin: 8.9 g/dL — ABNORMAL LOW (ref 12.0–15.0)
MCH: 30.2 pg (ref 26.0–34.0)
MCHC: 34.9 g/dL (ref 30.0–36.0)
MCV: 86.4 fL (ref 78.0–100.0)
Platelets: 169 10*3/uL (ref 150–400)
RBC: 2.95 MIL/uL — ABNORMAL LOW (ref 3.87–5.11)
RDW: 13 % (ref 11.5–15.5)
WBC: 7.9 10*3/uL (ref 4.0–10.5)

## 2012-10-30 MED ORDER — PROMETHAZINE HCL 25 MG PO TABS: 25.0000 mg | ORAL_TABLET | Freq: Three times a day (TID) | ORAL | Status: DC | PRN

## 2012-10-30 MED ORDER — ONDANSETRON 4 MG PO TBDP: 4.0000 mg | ORAL_TABLET | Freq: Three times a day (TID) | ORAL | Status: DC | PRN

## 2012-10-30 NOTE — Progress Notes (Signed)
Patient ID: Caitlin Burns, female   DOB: 06/29/1980, 33 y.o.   MRN: [id number] HD#2 S: Still with flank pain now lower, markedly decreased with also decrease in dysuria. Hematuria resolved. Good FM . No contractions No fever , no chills. Nausea improved. Pain stable and improved. Urine CS in office 6/23 negative  O: BP 94/44  Pulse 65  Temp(Src) 98.5 F (36.9 C) (Oral)  Resp 18  Ht 5\' 6"  (1.676 m)  Wt 73.483 kg (162 lb)  BMI 26.16 kg/m2  LMP 03/12/2012  Lgs: CTA CV: RRR ABD: gravid , NT No CVAT Pain in right flank, now lower and moving anterior Neuro: non focal  Skin: intact   CBC    Component Value Date/Time   WBC 7.9 10/30/2012 0517   RBC 2.95* 10/30/2012 0517   HGB 8.9* 10/30/2012 0517   HCT 25.5* 10/30/2012 0517   PLT 169 10/30/2012 0517   MCV 86.4 10/30/2012 0517   MCH 30.2 10/30/2012 0517   MCHC 34.9 10/30/2012 0517   RDW 13.0 10/30/2012 0517   LYMPHSABS 1.9 03/12/2012 1456   MONOABS 0.6 03/12/2012 1456   EOSABS 0.1 03/12/2012 1456   BASOSABS 0.1 03/12/2012 1456      Renal sono: Mild right hydronephrosis, no stone seen  IMP: 25 week IUP Anemia- ? Gestational Renal colic- likely distal stone- ? passed. No evidence of obstruction. Leukocytosis- ?etx. Now resolved. ? Complicated cystitis, doubt Pyelo. Urine CS neg  P: DC IV, DC home Fu scheduled with Urology Fu office one week Vicodin prn Zofran given

## 2012-11-01 NOTE — Discharge Summary (Signed)
Caitlin Burns, Caitlin Burns              ACCOUNT NO.:  [id number]  MEDICAL RECORD NO.:  [id number]  LOCATION:  9159                          FACILITY:  [facility]  PHYSICIAN:  Lenoard Aden, M.D.DATE OF BIRTH:  February 16, 1981  DATE OF ADMISSION:  10/29/2012 DATE OF DISCHARGE:  10/30/2012                              DISCHARGE SUMMARY   HOSPITAL COURSE:  The patient was admitted with right flank pain at [redacted] weeks gestation with presumed urolithiasis for inpatient management. She failed outpatient management on p.o. pain medications and p.o. hydration.  She was admitted and given IV pain medication.  Urine was strained.  IV pain management was initiated.  Fetal heart rate surveillance was within normal limits.  After 24-48 hours, her pain had improved.  She was empirically given ceftriaxone due to her leukocytosis, which resolved during the course of her hospitalization. She was discharged to home.  DISCHARGE MEDICATIONS:  Vicodin, which the patient already has, Flomax which the patient already has, and prenatal vitamins.  She has Urology followup scheduled for 1 day and Obstetric followup in 1 week.     Lenoard Aden, M.D.     RJT/MEDQ  D:  11/01/2012  T:  11/01/2012  Job:  [id number]

## 2012-11-27 ENCOUNTER — Encounter: Payer: Self-pay | Admitting: *Deleted

## 2012-11-27 ENCOUNTER — Encounter: Payer: 59 | Attending: Obstetrics and Gynecology | Admitting: *Deleted

## 2012-11-27 VITALS — Ht 66.0 in | Wt 162.2 lb

## 2012-11-27 DIAGNOSIS — Z713 Dietary counseling and surveillance: Secondary | ICD-10-CM | POA: Insufficient documentation

## 2012-11-27 DIAGNOSIS — O99810 Abnormal glucose complicating pregnancy: Secondary | ICD-10-CM

## 2012-11-27 DIAGNOSIS — R638 Other symptoms and signs concerning food and fluid intake: Secondary | ICD-10-CM | POA: Insufficient documentation

## 2012-11-27 NOTE — Progress Notes (Signed)
  Patient was seen on 11/27/12 for Gestational Diabetes self-management education at the [facility]. The following learning objectives were met by the patient during this appointment:   States the definition of Gestational Diabetes  States why dietary management is important in controlling blood glucose  Describes the effects each nutrient has on blood glucose levels  Demonstrates ability to create a balanced meal plan  Demonstrates carbohydrate counting   States when to check blood glucose levels  Demonstrates proper blood glucose monitoring techniques  States the effect of stress and exercise on blood glucose levels  States the importance of limiting caffeine and abstaining from alcohol and smoking  Blood glucose monitor given:  One Touch Ultra Mini Self Monitoring Kit Lot # [id number] X Exp: 08/15 Blood glucose reading: 111 mg/dl  Patient instructed to monitor glucose levels: FBS: 60 - <90 1 hour: <140 2 hour: <120  *Patient received handouts:  Nutrition Diabetes and Pregnancy  Carbohydrate Counting List  Patient will be seen for follow-up as needed.

## 2012-11-27 NOTE — Patient Instructions (Signed)
Goals:  Check glucose levels per MD as instructed  Follow Gestational Diabetes Diet as instructed  Call for follow-up as needed    

## 2012-12-17 ENCOUNTER — Ambulatory Visit: Payer: 59

## 2013-01-23 LAB — OB RESULTS CONSOLE GBS: GBS: NEGATIVE

## 2013-01-25 ENCOUNTER — Encounter (HOSPITAL_COMMUNITY): Payer: Self-pay | Admitting: *Deleted

## 2013-01-25 ENCOUNTER — Inpatient Hospital Stay (HOSPITAL_COMMUNITY)
Admission: AD | Admit: 2013-01-25 | Discharge: 2013-01-25 | Disposition: A | Discharge status: Home / Self Care | Payer: 59 | Source: Ambulatory Visit | Attending: Obstetrics | Admitting: Obstetrics

## 2013-01-25 DIAGNOSIS — O9903 Anemia complicating the puerperium: Secondary | ICD-10-CM | POA: Diagnosis not present

## 2013-01-25 DIAGNOSIS — D62 Acute posthemorrhagic anemia: Secondary | ICD-10-CM | POA: Diagnosis not present

## 2013-01-25 DIAGNOSIS — O479 False labor, unspecified: Secondary | ICD-10-CM | POA: Insufficient documentation

## 2013-01-25 DIAGNOSIS — O139 Gestational [pregnancy-induced] hypertension without significant proteinuria, unspecified trimester: Secondary | ICD-10-CM | POA: Diagnosis present

## 2013-01-25 DIAGNOSIS — O409XX0 Polyhydramnios, unspecified trimester, not applicable or unspecified: Secondary | ICD-10-CM | POA: Diagnosis present

## 2013-01-25 DIAGNOSIS — O878 Other venous complications in the puerperium: Secondary | ICD-10-CM | POA: Diagnosis present

## 2013-01-25 DIAGNOSIS — O99810 Abnormal glucose complicating pregnancy: Secondary | ICD-10-CM | POA: Insufficient documentation

## 2013-01-25 DIAGNOSIS — K649 Unspecified hemorrhoids: Secondary | ICD-10-CM | POA: Diagnosis present

## 2013-01-25 DIAGNOSIS — O99814 Abnormal glucose complicating childbirth: Secondary | ICD-10-CM | POA: Diagnosis present

## 2013-01-25 MED ORDER — ZOLPIDEM TARTRATE 5 MG PO TABS
5.0000 mg | ORAL_TABLET | Freq: Every evening | ORAL | Status: DC | PRN
  Administered 2013-01-25: 5 mg via ORAL
  Filled 2013-01-25: qty 1

## 2013-01-25 NOTE — Progress Notes (Signed)
Updated on pt status, orders rec'ved to recheck vag exam in 1 hour and may dc home if no change.

## 2013-01-25 NOTE — OB Triage Note (Signed)
Pt cervix remains unchanged contracting 4-5 min. Givnen 5mg  of ambien with discharge instructions  Verbalized understanding vital normal fhr 132

## 2013-01-25 NOTE — Progress Notes (Signed)
Caitlin Burns is a 32 y.o. G1P0 at [redacted]w[redacted]d by LMP presents for evaluation of possible early  active labor. Regular contractions. No ROM . Good FM. No HA or visual changes. NO epigastric pain.  Subjective: As noted above. Presents for contractions.  Objective: BP 152/92  Pulse 56  Temp(Src) 98.1 F (36.7 C) (Oral)  Resp 20  Ht 5\' 6"  (1.676 m)  Wt 74.39 kg (164 lb)  BMI 26.48 kg/m2  LMP 03/12/2012      FHT:  FHR: 155 bpm, variability: moderate,  accelerations:  Present,  decelerations:  Absent UC:   irregular, every 5-7 minutes SVE:   Dilation: 2 Effacement (%): 70 Station: -2 Exam by:: De Nurse RN No LOF.  Labs: None pending at this time  Assessment / Plan: Prodromal labor GDM- diet controlle History of Gestational HTN- no s/s PEC.   Labor: Ambulate and reck, serial BP evaluation Preeclampsia:  no signs or symptoms of toxicity and labs stable Fetal Wellbeing:  Category I Pain Control:  Labor support without medications I/D:  n/a Anticipated MOD:  Likely DC home  TAAVON,RICHARD J 01/25/2013, 8:19 PM

## 2013-01-26 ENCOUNTER — Encounter (HOSPITAL_COMMUNITY): Payer: Self-pay | Admitting: Anesthesiology

## 2013-01-26 ENCOUNTER — Inpatient Hospital Stay (HOSPITAL_COMMUNITY): Payer: 59 | Admitting: Anesthesiology

## 2013-01-26 ENCOUNTER — Inpatient Hospital Stay (HOSPITAL_COMMUNITY)
Admission: AD | Admit: 2013-01-26 | Discharge: 2013-01-28 | DRG: 775 | Disposition: A | Discharge status: Home / Self Care | Payer: 59 | Source: Ambulatory Visit | Attending: Obstetrics and Gynecology | Admitting: Obstetrics and Gynecology

## 2013-01-26 ENCOUNTER — Encounter (HOSPITAL_COMMUNITY): Payer: Self-pay | Admitting: *Deleted

## 2013-01-26 ENCOUNTER — Inpatient Hospital Stay (HOSPITAL_COMMUNITY)
Admission: AD | Admit: 2013-01-26 | Discharge: 2013-01-26 | Disposition: A | Discharge status: Home / Self Care | Payer: 59 | Source: Ambulatory Visit | Attending: Obstetrics and Gynecology | Admitting: Obstetrics and Gynecology

## 2013-01-26 DIAGNOSIS — O139 Gestational [pregnancy-induced] hypertension without significant proteinuria, unspecified trimester: Secondary | ICD-10-CM | POA: Insufficient documentation

## 2013-01-26 DIAGNOSIS — O479 False labor, unspecified: Secondary | ICD-10-CM | POA: Insufficient documentation

## 2013-01-26 LAB — RPR: RPR Ser Ql: NONREACTIVE

## 2013-01-26 LAB — COMPREHENSIVE METABOLIC PANEL
ALT: 12 U/L (ref 0–35)
AST: 23 U/L (ref 0–37)
Albumin: 3 g/dL — ABNORMAL LOW (ref 3.5–5.2)
Alkaline Phosphatase: 100 U/L (ref 39–117)
BUN: 7 mg/dL (ref 6–23)
CO2: 20 mEq/L (ref 19–32)
Calcium: 9.4 mg/dL (ref 8.4–10.5)
Chloride: 96 mEq/L (ref 96–112)
Creatinine, Ser: 0.73 mg/dL (ref 0.50–1.10)
GFR calc Af Amer: >90 mL/min (ref >90)
GFR calc non Af Amer: >90 mL/min (ref >90)
Glucose, Bld: 81 mg/dL (ref 70–99)
Potassium: 4.1 mEq/L (ref 3.5–5.1)
Sodium: 132 mEq/L — ABNORMAL LOW (ref 135–145)
Total Bilirubin: 0.3 mg/dL (ref 0.3–1.2)
Total Protein: 6.8 g/dL (ref 6.0–8.3)

## 2013-01-26 LAB — CBC
HCT: 34.5 % — ABNORMAL LOW (ref 36.0–46.0)
Hemoglobin: 11.8 g/dL — ABNORMAL LOW (ref 12.0–15.0)
MCH: 26.9 pg (ref 26.0–34.0)
MCHC: 34.2 g/dL (ref 30.0–36.0)
MCV: 78.6 fL (ref 78.0–100.0)
Platelets: 210 10*3/uL (ref 150–400)
RBC: 4.39 MIL/uL (ref 3.87–5.11)
RDW: 13.6 % (ref 11.5–15.5)
WBC: 12.8 10*3/uL — ABNORMAL HIGH (ref 4.0–10.5)

## 2013-01-26 LAB — ABO/RH: ABO/RH(D): O POS

## 2013-01-26 LAB — LACTATE DEHYDROGENASE: LDH: 193 U/L (ref 94–250)

## 2013-01-26 LAB — GLUCOSE, RANDOM: Glucose, Bld: 81 mg/dL (ref 70–99)

## 2013-01-26 LAB — URIC ACID: Uric Acid, Serum: 4.9 mg/dL (ref 2.4–7.0)

## 2013-01-26 MED ORDER — LANOLIN HYDROUS EX OINT: TOPICAL_OINTMENT | CUTANEOUS | Status: DC | PRN

## 2013-01-26 MED ORDER — PHENYLEPHRINE 40 MCG/ML (10ML) SYRINGE FOR IV PUSH (FOR BLOOD PRESSURE SUPPORT)
80.0000 ug | PREFILLED_SYRINGE | INTRAVENOUS | Status: DC | PRN
  Filled 2013-01-26: qty 5
  Filled 2013-01-26: qty 2

## 2013-01-26 MED ORDER — METHYLERGONOVINE MALEATE 0.2 MG/ML IJ SOLN: 0.2000 mg | INTRAMUSCULAR | Status: DC | PRN

## 2013-01-26 MED ORDER — FLEET ENEMA 7-19 GM/118ML RE ENEM: 1.0000 | ENEMA | RECTAL | Status: DC | PRN

## 2013-01-26 MED ORDER — OXYCODONE-ACETAMINOPHEN 5-325 MG PO TABS
1.0000 | ORAL_TABLET | ORAL | Status: DC | PRN
  Administered 2013-01-26 (×2): 1 via ORAL
  Filled 2013-01-26: qty 1

## 2013-01-26 MED ORDER — ONDANSETRON HCL 4 MG/2ML IJ SOLN
4.0000 mg | Freq: Four times a day (QID) | INTRAMUSCULAR | Status: DC | PRN
  Administered 2013-01-26 (×2): 4 mg via INTRAVENOUS
  Filled 2013-01-26 (×2): qty 2

## 2013-01-26 MED ORDER — EPHEDRINE 5 MG/ML INJ
10.0000 mg | INTRAVENOUS | Status: DC | PRN
  Filled 2013-01-26: qty 2
  Filled 2013-01-26: qty 4

## 2013-01-26 MED ORDER — LACTATED RINGERS IV SOLN
INTRAVENOUS | Status: DC
  Administered 2013-01-26 (×2): 125 mL/h via INTRAVENOUS

## 2013-01-26 MED ORDER — OXYTOCIN BOLUS FROM INFUSION: 500.0000 mL | INTRAVENOUS | Status: DC

## 2013-01-26 MED ORDER — IBUPROFEN 600 MG PO TABS
600.0000 mg | ORAL_TABLET | Freq: Four times a day (QID) | ORAL | Status: DC
  Administered 2013-01-27 – 2013-01-28 (×6): 600 mg via ORAL
  Filled 2013-01-26 (×6): qty 1

## 2013-01-26 MED ORDER — WITCH HAZEL-GLYCERIN EX PADS
1.0000 "application " | MEDICATED_PAD | CUTANEOUS | Status: DC | PRN
  Administered 2013-01-27 (×2): 1 via TOPICAL

## 2013-01-26 MED ORDER — FENTANYL 2.5 MCG/ML BUPIVACAINE 1/10 % EPIDURAL INFUSION (WH - ANES)
14.0000 mL/h | INTRAMUSCULAR | Status: DC | PRN
  Administered 2013-01-26 (×2): 14 mL/h via EPIDURAL
  Filled 2013-01-26 (×2): qty 125

## 2013-01-26 MED ORDER — DIPHENHYDRAMINE HCL 25 MG PO CAPS: 25.0000 mg | ORAL_CAPSULE | Freq: Four times a day (QID) | ORAL | Status: DC | PRN

## 2013-01-26 MED ORDER — METHYLERGONOVINE MALEATE 0.2 MG PO TABS: 0.2000 mg | ORAL_TABLET | ORAL | Status: DC | PRN

## 2013-01-26 MED ORDER — TETANUS-DIPHTH-ACELL PERTUSSIS 5-2.5-18.5 LF-MCG/0.5 IM SUSP: 0.5000 mL | Freq: Once | INTRAMUSCULAR | Status: DC

## 2013-01-26 MED ORDER — LIDOCAINE HCL (PF) 1 % IJ SOLN
30.0000 mL | INTRAMUSCULAR | Status: DC | PRN
  Filled 2013-01-26 (×2): qty 30

## 2013-01-26 MED ORDER — OXYCODONE-ACETAMINOPHEN 5-325 MG PO TABS
1.0000 | ORAL_TABLET | ORAL | Status: DC | PRN
  Administered 2013-01-27 (×2): 1 via ORAL
  Filled 2013-01-26 (×3): qty 1

## 2013-01-26 MED ORDER — ZOLPIDEM TARTRATE 5 MG PO TABS: 5.0000 mg | ORAL_TABLET | Freq: Every evening | ORAL | Status: DC | PRN

## 2013-01-26 MED ORDER — ACETAMINOPHEN 325 MG PO TABS: 650.0000 mg | ORAL_TABLET | ORAL | Status: DC | PRN

## 2013-01-26 MED ORDER — BUTORPHANOL TARTRATE 1 MG/ML IJ SOLN
1.0000 mg | Freq: Once | INTRAMUSCULAR | Status: AC
  Administered 2013-01-26: 1 mg via INTRAMUSCULAR
  Filled 2013-01-26: qty 1

## 2013-01-26 MED ORDER — PROMETHAZINE HCL 25 MG/ML IJ SOLN
12.5000 mg | Freq: Once | INTRAMUSCULAR | Status: AC
  Administered 2013-01-26: 12.5 mg via INTRAMUSCULAR
  Filled 2013-01-26: qty 1

## 2013-01-26 MED ORDER — LACTATED RINGERS IV SOLN
500.0000 mL | INTRAVENOUS | Status: DC | PRN
  Administered 2013-01-26: 1000 mL via INTRAVENOUS

## 2013-01-26 MED ORDER — CITRIC ACID-SODIUM CITRATE 334-500 MG/5ML PO SOLN: 30.0000 mL | ORAL | Status: DC | PRN

## 2013-01-26 MED ORDER — IBUPROFEN 600 MG PO TABS
600.0000 mg | ORAL_TABLET | Freq: Four times a day (QID) | ORAL | Status: DC | PRN
  Filled 2013-01-26: qty 1

## 2013-01-26 MED ORDER — PRENATAL MULTIVITAMIN CH
1.0000 | ORAL_TABLET | Freq: Every day | ORAL | Status: DC
  Administered 2013-01-27 – 2013-01-28 (×2): 1 via ORAL
  Filled 2013-01-26: qty 1

## 2013-01-26 MED ORDER — EPHEDRINE 5 MG/ML INJ
10.0000 mg | INTRAVENOUS | Status: DC | PRN
  Filled 2013-01-26: qty 2

## 2013-01-26 MED ORDER — OXYTOCIN 40 UNITS IN LACTATED RINGERS INFUSION - SIMPLE MED
62.5000 mL/h | INTRAVENOUS | Status: DC
  Administered 2013-01-26: 500 mL/h via INTRAVENOUS

## 2013-01-26 MED ORDER — DIPHENHYDRAMINE HCL 50 MG/ML IJ SOLN: 12.5000 mg | INTRAMUSCULAR | Status: DC | PRN

## 2013-01-26 MED ORDER — SIMETHICONE 80 MG PO CHEW: 80.0000 mg | CHEWABLE_TABLET | ORAL | Status: DC | PRN

## 2013-01-26 MED ORDER — ONDANSETRON HCL 4 MG/2ML IJ SOLN: 4.0000 mg | INTRAMUSCULAR | Status: DC | PRN

## 2013-01-26 MED ORDER — PHENYLEPHRINE 40 MCG/ML (10ML) SYRINGE FOR IV PUSH (FOR BLOOD PRESSURE SUPPORT)
80.0000 ug | PREFILLED_SYRINGE | INTRAVENOUS | Status: DC | PRN
  Filled 2013-01-26: qty 2

## 2013-01-26 MED ORDER — SENNOSIDES-DOCUSATE SODIUM 8.6-50 MG PO TABS
2.0000 | ORAL_TABLET | ORAL | Status: DC
  Administered 2013-01-27 – 2013-01-28 (×2): 2 via ORAL

## 2013-01-26 MED ORDER — LIDOCAINE HCL (PF) 1 % IJ SOLN
INTRAMUSCULAR | Status: DC | PRN
  Administered 2013-01-26 (×2): 5 mL

## 2013-01-26 MED ORDER — ONDANSETRON HCL 4 MG PO TABS
4.0000 mg | ORAL_TABLET | ORAL | Status: DC | PRN
  Administered 2013-01-27 (×2): 4 mg via ORAL
  Filled 2013-01-26 (×2): qty 1

## 2013-01-26 MED ORDER — OXYTOCIN 40 UNITS IN LACTATED RINGERS INFUSION - SIMPLE MED
1.0000 m[IU]/min | INTRAVENOUS | Status: DC
  Administered 2013-01-26: 2 m[IU]/min via INTRAVENOUS
  Filled 2013-01-26: qty 1000

## 2013-01-26 MED ORDER — BENZOCAINE-MENTHOL 20-0.5 % EX AERO
1.0000 "application " | INHALATION_SPRAY | CUTANEOUS | Status: DC | PRN
  Administered 2013-01-27 (×2): 1 via TOPICAL
  Filled 2013-01-26 (×2): qty 56

## 2013-01-26 MED ORDER — DIBUCAINE 1 % RE OINT
1.0000 "application " | TOPICAL_OINTMENT | RECTAL | Status: DC | PRN
  Administered 2013-01-27: 1 via RECTAL
  Filled 2013-01-26: qty 28

## 2013-01-26 MED ORDER — LACTATED RINGERS IV SOLN: 500.0000 mL | Freq: Once | INTRAVENOUS | Status: DC

## 2013-01-26 NOTE — Anesthesia Preprocedure Evaluation (Signed)
Anesthesia Evaluation  Patient identified by MRN, date of birth, ID band Patient awake    Reviewed: Allergy & Precautions, H&P , Patient's Chart, lab work & pertinent test results  Airway Mallampati: II TM Distance: >3 FB Neck ROM: full    Dental no notable dental hx.    Pulmonary neg pulmonary ROS, asthma ,  breath sounds clear to auscultation  Pulmonary exam normal       Cardiovascular hypertension, negative cardio ROS  Rhythm:regular Rate:Normal     Neuro/Psych  Headaches, negative neurological ROS  negative psych ROS   GI/Hepatic negative GI ROS, Neg liver ROS, GERD-  ,  Endo/Other  negative endocrine ROSdiabetes  Renal/GU negative Renal ROS     Musculoskeletal   Abdominal   Peds  Hematology negative hematology ROS (+)   Anesthesia Other Findings ATTENTION DEFICIT HYPERACTIVITY DISORDER, 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   Asthma 10/06/2010      DEPRESSION 07/14/2007   Essential hypertension, benign 2/2 ADHD meds 03/24/2010      RAYNAUD'S SYNDROME, 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   Anxiety 10/06/2010      GERD (gastroesophageal reflux disease)     Diabetes mellitus without complication    Reproductive/Obstetrics (+) Pregnancy                           Anesthesia Physical Anesthesia Plan  ASA: III  Anesthesia Plan: Epidural   Post-op Pain Management:    Induction:   Airway Management Planned:   Additional Equipment:   Intra-op Plan:   Post-operative Plan:   Informed Consent: I have reviewed the patients History and Physical, chart, labs and discussed the procedure including the risks, benefits and alternatives for the proposed anesthesia with the patient or authorized representative who has indicated his/her understanding and acceptance.     Plan Discussed with:   Anesthesia Plan Comments:         Anesthesia Quick Evaluation

## 2013-01-26 NOTE — Anesthesia Procedure Notes (Signed)
Epidural Patient location during procedure: OB Start time: 01/26/2013 11:57 AM  Staffing Anesthesiologist: Angus Seller., Harrell Gave. Performed by: anesthesiologist   Preanesthetic Checklist Completed: patient identified, site marked, surgical consent, pre-op evaluation, timeout performed, IV checked, risks and benefits discussed and monitors and equipment checked  Epidural Patient position: sitting Prep: site prepped and draped and DuraPrep Patient monitoring: continuous pulse ox and blood pressure Approach: midline Injection technique: LOR air  Needle:  Needle type: Tuohy  Needle gauge: 17 G Needle length: 9 cm and 9 Needle insertion depth: 5 cm cm Catheter type: closed end flexible Catheter size: 19 Gauge Catheter at skin depth: 10 cm Test dose: negative  Assessment Events: blood not aspirated, injection not painful, no injection resistance, negative IV test and no paresthesia  Additional Notes Patient identified.  Risk benefits discussed including failed block, incomplete pain control, headache, nerve damage, paralysis, blood pressure changes, nausea, vomiting, reactions to medication both toxic or allergic, and postpartum back pain.  Patient expressed understanding and wished to proceed.  All questions were answered.  Sterile technique used throughout procedure and epidural site dressed with sterile barrier dressing. No paresthesia or other complications noted.The patient did not experience any signs of intravascular injection such as tinnitus or metallic taste in mouth nor signs of intrathecal spread such as rapid motor block. Please see nursing notes for vital signs.

## 2013-01-26 NOTE — Progress Notes (Signed)
S:  Comfortable with epidural - no pain  O:  VS: Blood pressure 128/88, pulse 60, temperature 98.1 F (36.7 C), temperature source Oral, resp. rate 18, height 5\' 6"  (1.676 m), weight 74.39 kg (164 lb), last menstrual period 03/12/2012, SpO2 100.00%.        FHR : baseline 130 / variability moderate / accelerations + / no decelerations        Toco: contractions every 5-8 minutes / mild         Cervix : 5cm / 90% / vtx / -2 / BBOW        Membranes: AROM - clear  A: active labor - protracted ctx pattern     FHR category 1  P: pitocin augmentation     Dr Billy Coast update - will follow   Marlinda Mike CNM, MSN, [facility] 01/26/2013, 1:05 PM

## 2013-01-26 NOTE — H&P (Signed)
Caitlin Burns is a 32 y.o. female presenting for labor. Maternal Medical History:  Reason for admission: Contractions.   Contractions: Onset was 13-24 hours ago.   Frequency: regular.   Perceived severity is moderate.    Fetal activity: Perceived fetal activity is normal.   Last perceived fetal movement was within the past hour.    Prenatal complications: PIH and polyhydramnios.   Prenatal Complications - Diabetes: gestational. Diabetes is managed by diet.      OB History   Grav Para Term Preterm Abortions TAB SAB Ect Mult Living   1 0 0 0 0 0 0 0 0 0      Past Medical History  Diagnosis Date  . ATTENTION DEFICIT HYPERACTIVITY DISORDER, 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  . Asthma 10/06/2010  . DEPRESSION 07/14/2007  . Essential hypertension, benign 03/24/2010  . 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 Anxiety 10/06/2010  . GERD (gastroesophageal reflux disease)   . Diabetes mellitus without complication    Past Surgical History  Procedure Laterality Date  . Lasix eye surgury  2004  . Wisdom tooth extraction     Family History: family history includes Arthritis in her mother; Brain cancer in her paternal grandfather; Cancer in her father; Coronary artery disease in her father; Diabetes in her father; Heart disease in her father; Hyperlipidemia in her father; Hypertension in her father; Lung cancer in her paternal grandfather; Osteopenia in her mother; Raynaud syndrome in her mother. Social History:  reports that she has never smoked. She has never used smokeless tobacco. She reports that  drinks alcohol. She reports that she does not use illicit drugs.   Prenatal Transfer Tool  Maternal Diabetes: Yes:  Diabetes Type:  Diet controlled Genetic Screening: Normal Maternal Ultrasounds/Referrals: Abnormal:  Findings:   Other:LGA with polyhydramnios Fetal Ultrasounds or other Referrals:  None Maternal Substance Abuse:  No Significant Maternal Medications:  None Significant Maternal Lab Results:   None Other Comments:  None  Review of Systems  All other systems reviewed and are negative.    Dilation: 5 Exam by:: Wiliam Ke, CNM Blood pressure 128/88, pulse 60, temperature 98.1 F (36.7 C), temperature source Oral, resp. rate 18, height 5\' 6"  (1.676 m), weight 74.39 kg (164 lb), last menstrual period 03/12/2012, SpO2 100.00%. Maternal Exam:  Uterine Assessment: Contraction strength is moderate.  Contraction frequency is regular.   Abdomen: Patient reports no abdominal tenderness. Fetal presentation: vertex  Introitus: Normal vulva. Normal vagina.  Ferning test: not done.  Nitrazine test: not done.  Pelvis: adequate for delivery.   Cervix: Cervix evaluated by digital exam.     Physical Exam  Nursing note and vitals reviewed. Constitutional: She appears well-developed.  HENT:  Head: Normocephalic.  Eyes: Pupils are equal, round, and reactive to light.  Neck: Normal range of motion.  Cardiovascular: Normal rate.   Respiratory: Effort normal.  GI: Soft.  Genitourinary: Vagina normal. Guaiac positive stool.  Musculoskeletal: Normal range of motion.  Neurological: She is alert.  Skin: Skin is warm and dry.  Psychiatric: She has a normal mood and affect.    Prenatal labs: ABO, Rh: O/Positive/-- (03/03 0000) Antibody: Negative (03/03 0000) Rubella: Immune (03/03 0000) RPR: Nonreactive (03/03 0000)  HBsAg: Negative (03/03 0000)  HIV: Non-reactive (03/03 0000)  GBS: Negative (09/19 0000)   Assessment/Plan: Active Labor GDM Gestational HTN   TAAVON,RICHARD J 01/26/2013, 1:07 PM

## 2013-01-26 NOTE — Progress Notes (Signed)
Caitlin Burns is a 32 y.o. G1P0000 at [redacted]w[redacted]d by LMP admitted for active labor  Subjective: comfortable  Objective: BP 140/94  Pulse 69  Temp(Src) 98.3 F (36.8 C) (Oral)  Resp 20  Ht 5\' 6"  (1.676 m)  Wt 74.39 kg (164 lb)  BMI 26.48 kg/m2  SpO2 100%  LMP 03/12/2012   Total I/O In: -  Out: 50 [Urine:50]  FHT:  FHR: 145 bpm, variability: moderate,  accelerations:  Present,  decelerations:  Absent UC:   regular, every 2 minutes-MVU 200-220 SVE:   Dilation: 7 Effacement (%): 100 Station: -1;0 Exam by:: Valentina Lucks, RN  Labs: Lab Results  Component Value Date   WBC 12.8* 01/26/2013   HGB 11.8* 01/26/2013   HCT 34.5* 01/26/2013   MCV 78.6 01/26/2013   PLT 210 01/26/2013    Assessment / Plan: Augmentation of labor, progressing well Gestational HTN - nl labs GDM - BS stable.  Labor: Progressing normally Preeclampsia:  no signs or symptoms of toxicity and labs stable Fetal Wellbeing:  Category I Pain Control:  Epidural I/D:  n/a Anticipated MOD:  NSVD  TAAVON,RICHARD J 01/26/2013, 5:06 PM

## 2013-01-26 NOTE — MAU Provider Note (Signed)
  History   Painful contractions. No LOF. Charlesetta Garibaldi  CSN: [id number]  Arrival date and time: 01/26/13 0037   None     No chief complaint on file.  HPI- above noted  OB History   Grav Para Term Preterm Abortions TAB SAB Ect Mult Living   1 0              Past Medical History  Diagnosis Date  . ATTENTION DEFICIT HYPERACTIVITY DISORDER, 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  . Asthma 10/06/2010  . DEPRESSION 07/14/2007  . Essential hypertension, benign 03/24/2010  . 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 Anxiety 10/06/2010  . GERD (gastroesophageal reflux disease)   . Diabetes mellitus without complication     Past Surgical History  Procedure Laterality Date  . Lasix eye surgury  2004  . Wisdom tooth extraction      Family History  Problem Relation Age of Onset  . Raynaud syndrome Mother   . Osteopenia Mother   . Arthritis Mother     OA, osteopenia  . Hypertension Father   . Hyperlipidemia Father   . Coronary artery disease Father   . Diabetes Father   . Heart disease Father   . Cancer Father     melanoma  . Lung cancer Paternal Grandfather   . Brain cancer Paternal Grandfather     History  Substance Use Topics  . Smoking status: Never Smoker   . Smokeless tobacco: Never Used  . Alcohol Use: Yes     Comment: rare    Allergies:  Allergies  Allergen Reactions  . Aspirin Hives and Rash  . Sulfonamide Derivatives Hives and Rash    No prescriptions prior to admission    ROS Physical Exam Lgs: CTA CV: RRR ABD: gravid and NT   Blood pressure 135/82, pulse 76, temperature 97.9 F (36.6 C), temperature source Oral, resp. rate 16, last menstrual period 03/12/2012.  Physical Exam  MAU Course  Procedures Reactive NST with irregular contractions MDM na  Assessment and Plan  Prodromal labor Gestational HTN- stable DC home PEC warnings  TAAVON,RICHARD J 01/26/2013, 7:01 AM

## 2013-01-27 ENCOUNTER — Encounter (HOSPITAL_COMMUNITY): Payer: Self-pay | Admitting: Obstetrics and Gynecology

## 2013-01-27 LAB — CBC
HCT: 28 % — ABNORMAL LOW (ref 36.0–46.0)
Hemoglobin: 9.7 g/dL — ABNORMAL LOW (ref 12.0–15.0)
MCH: 27 pg (ref 26.0–34.0)
MCHC: 34.6 g/dL (ref 30.0–36.0)
MCV: 78 fL (ref 78.0–100.0)
Platelets: 162 10*3/uL (ref 150–400)
RBC: 3.59 MIL/uL — ABNORMAL LOW (ref 3.87–5.11)
RDW: 13.7 % (ref 11.5–15.5)
WBC: 16.6 10*3/uL — ABNORMAL HIGH (ref 4.0–10.5)

## 2013-01-27 LAB — GLUCOSE, CAPILLARY
Glucose-Capillary: 192 mg/dL — ABNORMAL HIGH (ref 70–99)
Glucose-Capillary: 85 mg/dL (ref 70–99)

## 2013-01-27 NOTE — Progress Notes (Signed)
Patient ID: Caitlin Burns, female   DOB: 1980-05-17, 32 y.o.   MRN: [id number] PPD # 1 G1 P1 w/ Hx GDM A1 and gest HTN  Subjective: Pt reports feeling well, sore/ Pain controlled with ibuprofen Tolerating po/ Voiding without problems/ No n/v Bleeding is moderate Newborn info:  Information for the patient's newborn:  Caitlin, Burns [[id number]]  female  / circ pending, per Dr Billy Coast Feeding: breast   Objective:  VS: Blood pressure 111/78, pulse 55, temperature 98 F (36.7 C), temperature source Oral, resp. rate 18  FBS:  85   Recent Labs  01/26/13 1107 01/27/13 0535  WBC 12.8* 16.6*  HGB 11.8* 9.7*  HCT 34.5* 28.0*  PLT 210 162    Blood type: /O POS Rubella: Immune   Physical Exam:  General:  alert, cooperative and no distress CV: Regular rate and rhythm Resp: clear Abdomen: soft, nontender, normal bowel sounds Uterine Fundus: firm, below umbilicus, nontender Perineum: healing with good reapproximation Lochia: minimal Ext: edema +1 and Homans sign is negative, no sign of DVT   A/P: PPD # 1/ G1P1001/ S/P: SVD w/ 2nd deg repair Gest HTN and DM stable, improving Mild ABL anemia; initiate fe supplement pp Day 2 Doing well Continue routine post partum orders Anticipate D/C home in AM    Demetrius Revel, MSN, [facility] 01/27/2013, 8:38 AM

## 2013-01-27 NOTE — Lactation Note (Signed)
This note was copied from the chart of Caitlin Erryn Napier. Lactation Consultation Note  Patient Name: Caitlin Burns [id number] Date: 01/27/2013 Reason for consult: Initial assessment of this first-time mom and her newborn at 8 hours of age.     Maternal Data Formula Feeding for Exclusion: No Infant to breast within first hour of birth: Yes Has patient been taught Hand Expression?: Yes Does the patient have breastfeeding experience prior to this delivery?: No  Feeding Feeding Type: Breast Milk Length of feed: 15 min  LATCH Score/Interventions Latch: Repeated attempts needed to sustain latch, nipple held in mouth throughout feeding, stimulation needed to elicit sucking reflex.  Audible Swallowing: A few with stimulation (easily expressed drops of colostrum) Intervention(s): Skin to skin;Hand expression  Type of Nipple: Everted at rest and after stimulation  Comfort (Breast/Nipple): Soft / non-tender (sometimes feels slight pain but none this time)     Hold (Positioning): Assistance needed to correctly position infant at breast and maintain latch. (reviewed signs of proper latch; positioning for comfort) Intervention(s): Breastfeeding basics reviewed;Support Pillows;Position options;Skin to skin  LATCH Score: 7  (this feeding, with LC assistance)  Lactation Tools Discussed/Used   STS, hand expression, cue feedings, cluster-feeding Positioning  Consult Status Consult Status: Follow-up Date: 01/28/13 Follow-up type: In-patient    Warrick [facility] 01/27/2013, 11:38 PM

## 2013-01-27 NOTE — Anesthesia Postprocedure Evaluation (Signed)
Anesthesia Post Note  Patient: Caitlin Burns  Procedure(s) Performed: * No procedures listed *  Anesthesia type: Epidural  Patient location: Mother/Baby  Post pain: Pain level controlled  Post assessment: Post-op Vital signs reviewed  Last Vitals:  Filed Vitals:   01/27/13 0430  BP: 111/78  Pulse: 55  Temp: 36.7 C  Resp: 18    Post vital signs: Reviewed  Level of consciousness:alert  Complications: No apparent anesthesia complications

## 2013-01-27 NOTE — Progress Notes (Signed)

## 2013-01-28 MED ORDER — POLYSACCHARIDE IRON COMPLEX 150 MG PO CAPS: 150.0000 mg | ORAL_CAPSULE | Freq: Every day | ORAL | Status: DC

## 2013-01-28 MED ORDER — HYDROCORTISONE 2.5 % RE CREA: TOPICAL_CREAM | Freq: Two times a day (BID) | RECTAL | Status: DC

## 2013-01-28 MED ORDER — IBUPROFEN 600 MG PO TABS: 600.0000 mg | ORAL_TABLET | Freq: Four times a day (QID) | ORAL | Status: DC

## 2013-01-28 NOTE — Discharge Summary (Signed)
Obstetric Discharge Summary Reason for Admission: onset of labor Prenatal Procedures: ultrasound Intrapartum Procedures: spontaneous vaginal delivery Postpartum Procedures: none Complications-Operative and Postpartum: 2nd degree perineal laceration Hemoglobin  Date Value Range Status  01/27/2013 9.7* 12.0 - 15.0 g/dL Final     DELTA CHECK NOTED     REPEATED TO VERIFY     HCT  Date Value Range Status  01/27/2013 28.0* 36.0 - 46.0 % Final    Physical Exam:  General: alert and cooperative Lochia: appropriate Uterine Fundus: firm Incision: healing well, no significant drainage, no dehiscence, no significant erythema DVT Evaluation: No evidence of DVT seen on physical exam. Negative Homan's sign.  Discharge Diagnoses: Term Pregnancy-delivered and A1GDM, delivered; Gestational hypertension, delivered  Discharge Information: Date: 01/28/2013 Activity: pelvic rest Diet: routine Medications: PNV, Ibuprofen, Iron and Proctocream Condition: stable Instructions: refer to practice specific booklet Discharge to: home Follow-up Information   Follow up with Lenoard Aden, MD. Schedule an appointment as soon as possible for a visit in 6 weeks. (and glucose tolerance test)    Specialty:  Obstetrics and Gynecology   Contact information:   [address] [place] [address] [phone]       Newborn Data: Live born female on 01[birth year] Birth Weight: 7 lb 7.4 oz (3385 g) APGAR: 8, 9  Home with mother.  BHAMBRI, MELANIE, N 01/28/2013, 8:58 AM

## 2013-01-28 NOTE — Progress Notes (Addendum)
PPD #2- SVD  Subjective:   Reports feeling good, hemorrhoid is painful Tolerating po/ No nausea or vomiting No HA, visual changes, or epigastric pain Bleeding is light Pain controlled with Motrin Up ad lib / ambulatory / voiding without problems Newborn: breastfeeding  / Circumcision: planning   Objective:   VS: VS:  Filed Vitals:   01/27/13 0430 01/27/13 1230 01/27/13 1741 01/28/13 0545  BP: 111/78 126/84 118/76 115/67  Pulse: 55 98 76 61  Temp: 98 F (36.7 C) 98 F (36.7 C) 98.4 F (36.9 C) 98.3 F (36.8 C)  TempSrc: Oral  Oral Oral  Resp: 18 16 18 18   Height:      Weight:      SpO2:        LABS:  Recent Labs  01/26/13 1107 01/27/13 0535  WBC 12.8* 16.6*  HGB 11.8* 9.7*  PLT 210 162   Blood type: --/--/O POS (09/22 1105) Rubella: Immune (03/03 0000)                I&O: Intake/Output     09/23 0701 - 09/24 0700 09/24 0701 - 09/25 0700   Urine (mL/kg/hr)     Blood     Total Output       Net              Physical Exam: Alert and oriented X3 Abdomen: soft, non-tender, non-distended  Fundus: firm, non-tender, U-2 Perineum: Well approximated, no significant erythema, or drainage; mild edema, healing well. Edematous hemorrhoid present. Lochia: small Extremities: no edema, no calf pain or tenderness    Assessment: PPD # 2 G1P1001/ S/P:spontaneous vaginal, 2nd degree laceration ABL anemia Doing well - stable for discharge home   Plan: Discharge home RX's:  Ibuprofen 600mg  po Q 6 hrs prn pain #30 Refill x 0 Niferex 150mg  po QD #30 Refill x 1 Proctocream  Pr bid Routine pp visit in 6wks and GTT [facility] booklet given    Donette Larry, N MSN, CNM 01/28/2013, 8:50 AM

## 2013-02-02 ENCOUNTER — Ambulatory Visit: Payer: Self-pay

## 2013-02-02 NOTE — Lactation Note (Signed)
This note was copied from the chart of Melina Schools. Infant Lactation Consultation Outpatient Visit Note  Patient Name: Caitlin Burns Date of Birth: 01/26/2013 Birth Weight:  7 lb 7.4 oz (3385 g) Gestational Age at Delivery: Gestational Age: [redacted]w[redacted]d Type of Delivery:   Breastfeeding History Frequency of Breastfeeding: Q 3- very sleepy during the daytime. Length of Feeding: 10-20 min Voids: 6-8/day- Had void while here for appointment Stools: 2/day- had small stool while here  Supplementing / Method: Pumping:  Type of Pump:Medela   Frequency: 2-3X/day  Volume:  20-30 cc's  Comments:    Consultation Evaluation: Mom here after Ped appointment. [name] nursed for 12 minutes at the [facility] office about 1 hour ago.  Initial Feeding Assessment: Pre-feed Weight: 7- 1.2  With diaper  3210g Post-feed Weight: 7- 1.7  3222g Amount Transferred: 12 cc's Comments: Assisted mom in football hold- reviewed wide open mouth and keeping the baby close to the breast throughout the feeding. Mom is using cradle hold at every feeding. Encouraged to change positions occasionally to help nipples to heal.   Additional Feeding Assessment: Pre-feed Weight: 7- 1.7  3222g: Post-feed Weight:7- 2.4  3242g Amount Transferred: 20 Comments: Mom reports that the right nipple is more sore than the left. Assisted mom in football hold. Mom reports pain for the first minute but then eases off. Encouraged mom to wait until Francesco Sor opens wide then latch him on. Suggested hand expressing a little for him to taste when he latches. [name] needed much stimulation to continue nursing. Comfort gels given with instructions for use.Mom placed them on nipples and reports they feels great.  Total Breast milk Transferred this Visit:  32 cc's Total Supplement Given: 0  Additional Interventions:  Reviewed basic teaching- wide open mouth and keeping the baby close to the breast throughout the feeding.  Break the suction before  taking him off the breast.  Apply EBM to nipples after nursing and allow them to air dry. Use comfort gels. Undress [name] for feedings to keep him awake. Try to feed [name] 8-10 times/day for 15 minutes on each breast, Supplement [name] with any pumped milk you obtain.    Follow-Up  With Ped Can call here and make another appointment as needed.  To call prn with questions/concerns.    Pamelia Hoit 02/02/2013, 11:38 AM

## 2013-05-12 ENCOUNTER — Telehealth: Payer: Self-pay

## 2013-05-12 NOTE — Telephone Encounter (Signed)
The patient called and is hoping she can get an rx for acid reflux medication.  She states since she is still breast feeding, she is hoping to get an rx for protonix (spelling?)

## 2013-05-12 NOTE — Telephone Encounter (Signed)
Left message to return call 

## 2013-05-12 NOTE — Telephone Encounter (Signed)
protonix and zantac have serious precautions for use in lactating women. She needs to check with her pediatrician about safe products for acid reflux.

## 2013-05-12 NOTE — Telephone Encounter (Signed)
Patient called back and was advised of message

## 2013-11-02 ENCOUNTER — Other Ambulatory Visit: Payer: Self-pay

## 2013-11-02 MED ORDER — ALBUTEROL SULFATE HFA 108 (90 BASE) MCG/ACT IN AERS: 2.0000 | INHALATION_SPRAY | Freq: Four times a day (QID) | RESPIRATORY_TRACT | Status: DC | PRN

## 2014-01-04 ENCOUNTER — Ambulatory Visit: Payer: Self-pay | Admitting: Physician Assistant

## 2014-01-04 LAB — D-DIMER(ARMC): D-Dimer: 212 ng/ml

## 2014-01-07 ENCOUNTER — Telehealth: Payer: Self-pay | Admitting: Internal Medicine

## 2014-01-07 NOTE — Telephone Encounter (Signed)
Rec'd from [facility] forward 4 pages to Dr.Norins

## 2014-01-12 ENCOUNTER — Encounter: Payer: Self-pay | Admitting: Internal Medicine

## 2014-01-26 ENCOUNTER — Encounter: Payer: Self-pay | Admitting: Internal Medicine

## 2014-01-26 ENCOUNTER — Ambulatory Visit (INDEPENDENT_AMBULATORY_CARE_PROVIDER_SITE_OTHER): Payer: 59 | Admitting: Internal Medicine

## 2014-01-26 VITALS — BP 128/82 | HR 84 | Temp 98.6°F | Resp 12 | Wt 140.1 lb

## 2014-01-26 DIAGNOSIS — R51 Headache: Secondary | ICD-10-CM

## 2014-01-26 DIAGNOSIS — T887XXA Unspecified adverse effect of drug or medicament, initial encounter: Secondary | ICD-10-CM

## 2014-01-26 DIAGNOSIS — R0789 Other chest pain: Secondary | ICD-10-CM

## 2014-01-26 DIAGNOSIS — R002 Palpitations: Secondary | ICD-10-CM

## 2014-01-26 MED ORDER — TRAMADOL HCL 50 MG PO TABS: 50.0000 mg | ORAL_TABLET | Freq: Three times a day (TID) | ORAL | Status: DC | PRN

## 2014-01-26 MED ORDER — ALBUTEROL SULFATE HFA 108 (90 BASE) MCG/ACT IN AERS: 2.0000 | INHALATION_SPRAY | Freq: Four times a day (QID) | RESPIRATORY_TRACT | Status: DC | PRN

## 2014-01-26 NOTE — Progress Notes (Signed)
Pre visit review using our clinic review tool, if applicable. No additional management support is needed unless otherwise documented below in the visit note. 

## 2014-01-26 NOTE — Progress Notes (Signed)
   Subjective:    Patient ID: Caitlin Burns, female    DOB: [birth year], 33 y.o.   MRN: [id number]  HPI    She has had 2 episodes of profound flushing and chest tightness after ingesting ibuprofen. Thirty minutes after taking it she describes becoming very warm and red in the face with chest tightness. She describes some paresthesias of the left upper extremity as well as the hands and feet. She described color change in hands  ranging from purple to yellow. She believes episodes are associated with tachycardia & elevated blood pressure.  The first episode was 4 weeks ago and the second 01/24/14.  Actually 2 weeks ago she took ibuprofen with no issue  She's been taking ibuprofen for headaches which have become more frequent.    Several weeks ago she was seen at the urgent care without definitive treatment. EKG was not done  The symptoms do improve within an hour but she does have some residual chest tightness  after the events.  There is a strong family history of coronary artery disease. Her paternal grandmother has had strokes as well as heart attack. Father had heart attack in his 80s.    Review of Systems  She has somewhat constant chest discomfort along the left sternal border. She questioned whether this might be related to breast-feeding  She describes some exertional dyspnea in the context of her asthma.    She denies edema, claudication, or paroxysmal nocturnal dyspnea  GI review of systems is negative.Unexplained weight loss, abdominal pain, significant dyspepsia, dysphagia, melena, rectal bleeding, or persistently small caliber stools are denied.     Objective:   Physical Exam She appears healthy and well-nourished & in no acute distress  No carotid bruits are present.No neck pain distention present at 10 - 15 degrees. Thyroid normal to palpation  Heart rhythm and rate are normal with no gallop or murmur  Chest is clear with no increased work of breathing  There  is no evidence of aortic aneurysm or renal artery bruits  Abdomen soft with no organomegaly or masses. No HJR  No clubbing, cyanosis or edema present.  Pedal pulses are intact   No ischemic skin changes are present . Fingernails healthy   Alert and oriented. Strength, tone, DTRs reflexes normal          Assessment & Plan:  #1 adverse drug reaction vs unusual presentation of carcinoid or  pheochromocytoma #2 atypical chest pain #3 FH CAD  See orders. Carcinoid & pheochromocytoma evaluation if symptoms recur off ibuprofen

## 2014-01-26 NOTE — Patient Instructions (Signed)
Please discontinue Ibuprofen as adverse reaction suspected.

## 2014-03-08 ENCOUNTER — Encounter: Payer: Self-pay | Admitting: Internal Medicine

## 2014-03-09 ENCOUNTER — Encounter: Payer: 59 | Admitting: Internal Medicine

## 2014-03-09 DIAGNOSIS — Z0289 Encounter for other administrative examinations: Secondary | ICD-10-CM

## 2014-06-09 ENCOUNTER — Ambulatory Visit: Payer: 59 | Admitting: Internal Medicine

## 2014-06-10 ENCOUNTER — Encounter: Payer: Self-pay | Admitting: Family

## 2014-06-10 ENCOUNTER — Other Ambulatory Visit (INDEPENDENT_AMBULATORY_CARE_PROVIDER_SITE_OTHER): Payer: 59

## 2014-06-10 ENCOUNTER — Ambulatory Visit (INDEPENDENT_AMBULATORY_CARE_PROVIDER_SITE_OTHER): Payer: 59 | Admitting: Family

## 2014-06-10 VITALS — BP 120/80 | HR 96 | Temp 98.8°F | Resp 18 | Ht 66.0 in | Wt 140.0 lb

## 2014-06-10 DIAGNOSIS — R42 Dizziness and giddiness: Secondary | ICD-10-CM

## 2014-06-10 DIAGNOSIS — R0789 Other chest pain: Secondary | ICD-10-CM

## 2014-06-10 LAB — CBC
HCT: 40 % (ref 36.0–46.0)
Hemoglobin: 14.1 g/dL (ref 12.0–15.0)
MCHC: 35.1 g/dL (ref 30.0–36.0)
MCV: 83.3 fl (ref 78.0–100.0)
Platelets: 275 10*3/uL (ref 150.0–400.0)
RBC: 4.8 Mil/uL (ref 3.87–5.11)
RDW: 12.3 % (ref 11.5–15.5)
WBC: 9.9 10*3/uL (ref 4.0–10.5)

## 2014-06-10 LAB — GLUCOSE, POCT (MANUAL RESULT ENTRY): POC Glucose: 77 mg/dl (ref 70–99)

## 2014-06-10 NOTE — Progress Notes (Signed)
   Subjective:    Patient ID: Caitlin Burns, female    DOB: 1981-02-28, 34 y.o.   MRN: [id number]  Chief Complaint  Patient presents with  . Hypertension    says her BP has been high all day and she feels light headed, felt like she was gonna pass out earlier and felt a rush to head, chest tightness    HPI:  Caitlin Burns is a 34 y.o. female who presents today for an acute visit.    Associated symptoms of feeling lightheaded, chest tightness, shortness of breath and feeling like she was going to pass out has been going on since this morning. Indicates that her blood pressure at work was elevated compared to normal and that she is slightly tachycardiac. States she has eaten normally and drank plenty of fluids which have not helped with the feelings. She also has taken her Vyvanse this morning.   Review of her chart reveals a previous episode of unspecified chest pain that may have been related to ibuprofen.  Allergies  Allergen Reactions  . Aspirin Hives and Rash  . Sulfonamide Derivatives Hives and Rash    Current Outpatient Prescriptions on File Prior to Visit  Medication Sig Dispense Refill  . albuterol (PROVENTIL HFA;VENTOLIN HFA) 108 (90 BASE) MCG/ACT inhaler Inhale 2 puffs into the lungs every 6 (six) hours as needed for wheezing or shortness of breath. 18 g 3  . lisdexamfetamine (VYVANSE) 60 MG capsule Take 60 mg by mouth every morning.    . Prenatal Vit-Fe Fumarate-FA (PRENATAL MULTIVITAMIN) TABS Take 1 tablet by mouth daily at 12 noon.    . traMADol (ULTRAM) 50 MG tablet Take 1 tablet (50 mg total) by mouth every 8 (eight) hours as needed. 30 tablet 0   No current facility-administered medications on file prior to visit.    Review of Systems  Constitutional: Negative for diaphoresis.  Respiratory: Positive for chest tightness and shortness of breath.   Cardiovascular: Negative for chest pain and palpitations.  Neurological: Positive for light-headedness.        Objective:    BP 120/80 mmHg  Pulse 96  Temp(Src) 98.8 F (37.1 C) (Oral)  Resp 18  Ht 5\' 6"  (1.676 m)  Wt 140 lb (63.504 kg)  BMI 22.61 kg/m2  SpO2 96% Nursing note and vital signs reviewed.  Physical Exam  Constitutional: She is oriented to person, place, and time. She appears well-developed and well-nourished. No distress.  Cardiovascular: Normal rate, regular rhythm, normal heart sounds and intact distal pulses.   Pulmonary/Chest: Effort normal and breath sounds normal.  Neurological: She is alert and oriented to person, place, and time.  Skin: Skin is warm and dry.  Psychiatric: Her speech is normal and behavior is normal. Judgment and thought content normal. Her mood appears anxious.       Assessment & Plan:

## 2014-06-10 NOTE — Assessment & Plan Note (Signed)
In office EKG reveals normal sinus rhythm and point-of-care glucose of 77.  Symptoms and exam most likely related to anxiety or panic attack, however will obtain CBC, d-dimer, and basic metabolic panel to rule out potential blood clot or other metabolic factors. Cannot rule out Vyvanse as a contributing factor to this increase in blood pressure and chest tightness. Recommend holding Vyvanse tomorrow. Patient instructed on red flags of when to seek emergency care. Follow-up pending lab work.

## 2014-06-10 NOTE — Patient Instructions (Addendum)
Thank you for choosing [facility].  Summary/Instructions:  If your symptoms worsen or fail to improve, please contact our office for further instruction, or in case of emergency go directly to the emergency room at the closest medical facility.     

## 2014-06-10 NOTE — Progress Notes (Signed)
Pre visit review using our clinic review tool, if applicable. No additional management support is needed unless otherwise documented below in the visit note. 

## 2014-06-11 ENCOUNTER — Telehealth: Payer: Self-pay | Admitting: Family

## 2014-06-11 LAB — BASIC METABOLIC PANEL
BUN: 12 mg/dL (ref 6–23)
CO2: 28 mEq/L (ref 19–32)
Calcium: 9.6 mg/dL (ref 8.4–10.5)
Chloride: 101 mEq/L (ref 96–112)
Creatinine, Ser: 0.81 mg/dL (ref 0.40–1.20)
GFR: 86.37 mL/min (ref >60.00)
Glucose, Bld: 92 mg/dL (ref 70–99)
Potassium: 4.3 mEq/L (ref 3.5–5.1)
Sodium: 135 mEq/L (ref 135–145)

## 2014-06-11 NOTE — Telephone Encounter (Signed)
Called and left pt a message to call back.

## 2014-06-11 NOTE — Telephone Encounter (Signed)
Pt's symptoms have improved.

## 2014-06-11 NOTE — Telephone Encounter (Signed)
Please call patient to determine if she has still having symptoms or is feeling better today.

## 2014-06-12 ENCOUNTER — Encounter: Payer: Self-pay | Admitting: Family

## 2014-06-12 LAB — D-DIMER, QUANTITATIVE (NOT AT ARMC): D-Dimer, Quant: 0.47 ug/mL-FEU (ref 0.00–0.48)

## 2014-10-21 ENCOUNTER — Telehealth: Payer: Self-pay | Admitting: Family

## 2014-10-21 DIAGNOSIS — R002 Palpitations: Secondary | ICD-10-CM

## 2014-10-21 NOTE — Telephone Encounter (Signed)
Referral placed.

## 2014-10-21 NOTE — Telephone Encounter (Signed)
Patient called in to request that we refer patient to her old cardiologist @ [address]. She would like to see dr Johnsie Cancel. She needs one because of the length of time it has been since she has seen them.

## 2014-11-01 ENCOUNTER — Ambulatory Visit: Payer: Self-pay | Admitting: Family Medicine

## 2014-12-20 NOTE — Progress Notes (Signed)
Patient ID: Caitlin Burns, female   DOB: [birth year], 34 y.o.   MRN: [id number]     Cardiology Office Note   Date:  12/21/2014   ID:  Caitlin Burns, DOB 1980-06-25, MRN [id number]  PCP:  Mauricio Po, FNP  Cardiologist:   Jenkins Rouge, MD   No chief complaint on file.     History of Present Illness: Caitlin Burns is a 34 y.o. female who presents for evaluation of chest pain and tachycardia  Complained about this to primary in February.  ? Panic attack and related to Vyvanase.  Earlier in year she had a few stereotypical episodes of headache , elevated BP and atypical pain after taking motrin.  She has also noted some tingling in hands Describes some flushing and vagal type episodes.  Fairly active works with [employer].  Has a 34 yo girl and 34 yo boy and seems a bit overwhelmed by the younger one at times. Has been on Vyvanse for a long time Was off it during pregnancy.  Takes it to focus not binge eating.  Was on straterra before but indicated this increased her BP a lot.  Gets this from Dr Wylene Simmer.  She is concerned about her family history of CAD    Past Medical History  Diagnosis Date  . ATTENTION DEFICIT HYPERACTIVITY DISORDER, 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  . Asthma 10/06/2010  . DEPRESSION 07/14/2007  . Essential hypertension, benign 03/24/2010  . 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 Anxiety 10/06/2010  . GERD (gastroesophageal reflux disease)   . Diabetes mellitus without complication   . Postpartum care following vaginal delivery 01/27/2013  . SVD (spontaneous vaginal delivery) 01/27/2013    Past Surgical History  Procedure Laterality Date  . Lasix eye surgury  2004  . Wisdom tooth extraction       Current Outpatient Prescriptions  Medication Sig Dispense Refill  . albuterol (PROVENTIL HFA;VENTOLIN HFA) 108 (90 BASE) MCG/ACT inhaler Inhale 2 puffs into the lungs every 6 (six) hours as needed for wheezing or shortness of breath. 18 g 3  . lisdexamfetamine  (VYVANSE) 60 MG capsule Take 60 mg by mouth every morning.    . pantoprazole (PROTONIX) 40 MG tablet Take 40 mg by mouth daily.     No current facility-administered medications for this visit.    Allergies:   Advil; Aspirin; and Sulfonamide derivatives    Social History:  The patient  reports that she has never smoked. She has never used smokeless tobacco. She reports that she drinks alcohol. She reports that she does not use illicit drugs.   Family History:  The patient's family history includes Arthritis in her mother; Brain cancer in her paternal grandfather; Cancer in her father; Coronary artery disease in her father; Diabetes in her father; Heart disease in her father; Hyperlipidemia in her father; Hypertension in her father; Lung cancer in her paternal grandfather; Osteopenia in her mother; Raynaud syndrome in her mother.    ROS:  Please see the history of present illness.   Otherwise, review of systems are positive for none.   All other systems are reviewed and negative.    PHYSICAL EXAM: VS:  BP 130/90 mmHg  Pulse 87  Ht $R'5\' 6"'xw$  (1.676 m)  Wt 63.231 kg (139 lb 6.4 oz)  BMI 22.51 kg/m2 , BMI Body mass index is 22.51 kg/(m^2). Affect appropriate Healthy:  appears stated age 62: normal Neck supple with no adenopathy JVP normal no bruits no thyromegaly Lungs clear with no  wheezing and good diaphragmatic motion Heart:  S1/S2 no murmur, no rub, gallop or click PMI normal Abdomen: benighn, BS positve, no tenderness, no AAA no bruit.  No HSM or HJR Distal pulses intact with no bruits No edema Neuro non-focal Skin warm and dry No muscular weakness    EKG:   06/10/14  SR rate 91  ICRBBB  12/21/14  SR rate 87 ICRBBB no change    Recent Labs: 06/10/2014: BUN 12; Creatinine, Ser 0.81; Hemoglobin 14.1; Platelets 275.0; Potassium 4.3; Sodium 135    Lipid Panel    Component Value Date/Time   CHOL 194 10/30/2011 1247   TRIG 76.0 10/30/2011 1247   HDL 55.60 10/30/2011 1247    CHOLHDL 3 10/30/2011 1247   VLDL 15.2 10/30/2011 1247   LDLCALC 123* 10/30/2011 1247      Wt Readings from Last 3 Encounters:  12/21/14 63.231 kg (139 lb 6.4 oz)  06/10/14 63.504 kg (140 lb)  01/26/14 63.56 kg (140 lb 2 oz)      Other studies Reviewed: Additional studies/ records that were reviewed today include: Epic notes from primary .    ASSESSMENT AND PLAN:  1.  HTN:  Borderline.  Lifestyle modification with exercise diet and low sodium would improve off vyvanse 2. Presyncope:  She has vagal like episodes.  Body is use to relatively high adrenergic state on vyvanse.   Given family history  Some tightness in chest and ICRBBB on ECG will order stress echo 3. 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  Will check TSH/T4,  ESR RF and ANA to make sure nothing else going on    Current medicines are reviewed at length with the patient today.  The patient does not have concerns regarding medicines.  The following changes have been made:  no change  Labs/ tests ordered today include:  Labs and stress echo   No orders of the defined types were placed in this encounter.     Disposition:   FU with me PRN     Signed, Jenkins Rouge, MD  12/21/2014 3:15 PM    [facility] [address], [place], [place]  [address] Phone: [phone]; Fax: [phone]

## 2014-12-21 ENCOUNTER — Ambulatory Visit (INDEPENDENT_AMBULATORY_CARE_PROVIDER_SITE_OTHER): Payer: 59 | Admitting: Cardiovascular Disease

## 2014-12-21 ENCOUNTER — Encounter: Payer: Self-pay | Admitting: Cardiovascular Disease

## 2014-12-21 VITALS — BP 130/90 | HR 87 | Ht 66.0 in | Wt 139.4 lb

## 2014-12-21 DIAGNOSIS — R55 Syncope and collapse: Secondary | ICD-10-CM | POA: Diagnosis not present

## 2014-12-21 DIAGNOSIS — R079 Chest pain, unspecified: Secondary | ICD-10-CM | POA: Diagnosis not present

## 2014-12-21 DIAGNOSIS — R002 Palpitations: Secondary | ICD-10-CM

## 2014-12-21 NOTE — Patient Instructions (Signed)
Medication Instructions:  NO CHANGES Labwork: SED RATE, ANA, RF, TSH, T4    Testing/Procedures: Your physician has requested that you have a stress echocardiogram. For further information please visit [email or web address]. Please follow instruction sheet as given.   Follow-Up: Your physician recommends that you schedule a follow-up appointment in:  AS NEEDED  Any Other Special Instructions Will Be Listed Below (If Applicable).

## 2014-12-22 LAB — SEDIMENTATION RATE: Sed Rate: 13 mm/hr (ref 0–22)

## 2014-12-22 LAB — ANA: Anti Nuclear Antibody(ANA): NEGATIVE

## 2014-12-22 LAB — TSH: TSH: 1.81 u[IU]/mL (ref 0.35–4.50)

## 2014-12-22 LAB — T4, FREE: Free T4: 0.87 ng/dL (ref 0.60–1.60)

## 2014-12-22 LAB — RHEUMATOID FACTOR: Rhuematoid fact SerPl-aCnc: <10 IU/mL (ref <=14)

## 2014-12-27 ENCOUNTER — Telehealth (HOSPITAL_COMMUNITY): Payer: Self-pay | Admitting: *Deleted

## 2014-12-29 ENCOUNTER — Other Ambulatory Visit (HOSPITAL_COMMUNITY): Payer: 59

## 2014-12-29 ENCOUNTER — Ambulatory Visit (HOSPITAL_COMMUNITY): Payer: 59 | Attending: Cardiology

## 2014-12-29 DIAGNOSIS — R079 Chest pain, unspecified: Secondary | ICD-10-CM | POA: Insufficient documentation

## 2014-12-29 DIAGNOSIS — R55 Syncope and collapse: Secondary | ICD-10-CM | POA: Insufficient documentation

## 2014-12-29 DIAGNOSIS — E119 Type 2 diabetes mellitus without complications: Secondary | ICD-10-CM | POA: Diagnosis not present

## 2014-12-30 ENCOUNTER — Telehealth: Payer: Self-pay | Admitting: Cardiovascular Disease

## 2014-12-30 NOTE — Telephone Encounter (Signed)
PT  AWARE OF  STRESS ECHO RESULTS  .Caitlin Burns

## 2014-12-30 NOTE — Telephone Encounter (Signed)
New message  ° ° ° ° °Pt is returning your call  °

## 2015-04-09 ENCOUNTER — Telehealth: Payer: Self-pay

## 2015-04-09 NOTE — Telephone Encounter (Signed)
LVM for pt to call back as soon as possible.   RE: Flu Vaccine 2016 season

## 2015-05-23 MED FILL — PANTOPRAZOLE SOD DR 40 MG T: 40 | 60 days supply | Qty: 60 | Fill #3

## 2015-05-24 DIAGNOSIS — Z1151 Encounter for screening for human papillomavirus (HPV): Secondary | ICD-10-CM | POA: Diagnosis not present

## 2015-05-24 DIAGNOSIS — Z01419 Encounter for gynecological examination (general) (routine) without abnormal findings: Secondary | ICD-10-CM | POA: Diagnosis not present

## 2015-06-07 MED FILL — NORETHIN-ESTRAD-FERR 1-0.02: 1-20 | 84 days supply | Qty: 84 | Fill #0

## 2015-06-13 MED FILL — VYVANSE 60 MG CAPSULE: 60 | 90 days supply | Qty: 90 | Fill #0

## 2015-07-19 MED FILL — PANTOPRAZOLE SOD DR 40 MG T: 40 | 90 days supply | Qty: 90 | Fill #0

## 2015-08-24 ENCOUNTER — Other Ambulatory Visit: Payer: Self-pay | Admitting: Internal Medicine

## 2015-08-24 MED FILL — VENTOLIN HFA 90 MCG INHALER: 108 (90 BAS | 16 days supply | Qty: 18 | Fill #0

## 2015-08-24 MED FILL — NORETHIN-ESTRAD-FERR 1-0.02: 1-20 | 84 days supply | Qty: 84 | Fill #1

## 2015-08-24 NOTE — Telephone Encounter (Signed)
Are you ok with refilling this?   Looks like you only saw patient with acute visit in feb/2016---please advise, thanks

## 2015-09-13 MED FILL — VYVANSE 60 MG CAPSULE: 60 | 90 days supply | Qty: 90 | Fill #0

## 2015-09-29 DIAGNOSIS — F902 Attention-deficit hyperactivity disorder, combined type: Secondary | ICD-10-CM | POA: Diagnosis not present

## 2015-09-30 MED FILL — MIRTAZAPINE 15 MG TABLET: 15 | 90 days supply | Qty: 45 | Fill #0

## 2015-10-31 MED FILL — PANTOPRAZOLE SOD DR 40 MG T: 40 | 90 days supply | Qty: 90 | Fill #1

## 2015-11-21 MED FILL — NORETHIN-ESTRAD-FERR 1-0.02: 1-20 | 84 days supply | Qty: 84 | Fill #2

## 2015-11-28 ENCOUNTER — Telehealth: Payer: Self-pay | Admitting: Emergency Medicine

## 2015-11-28 NOTE — Telephone Encounter (Signed)
Pt aware.

## 2015-11-28 NOTE — Telephone Encounter (Signed)
Please advise 

## 2015-11-28 NOTE — Telephone Encounter (Signed)
Pt is flying out of town. She gets motion sickness and wanted to know if there was a prescription you could write for her that is better then Dramamine. Please follow up thanks.

## 2015-11-28 NOTE — Telephone Encounter (Signed)
If she is breastfeeding this may dry out her milk supply, but otherwise meclizine 25-50 mg taken 1 hour before travel.

## 2015-11-29 MED FILL — ALPRAZolam 0.5 MG TABS: 0.5 | 30 days supply | Qty: 60 | Fill #0

## 2015-12-02 ENCOUNTER — Telehealth: Payer: Self-pay | Admitting: Family

## 2015-12-02 NOTE — Telephone Encounter (Signed)
[place] [facility] - Client [place] Patient Name: Caitlin Burns DOB: 07/14/1980 Initial Comment Caller states having allergy symptoms, runny nose, lymp nodes swollen Nurse Assessment Nurse: Markus Daft, RN, [place] Date/Time (Eastern Time): 12/02/2015 11:16:48 AM Confirm and document reason for call. If symptomatic, describe symptoms. You must click the next button to save text entered. ---Caller states having allergy symptoms including itchy - runny nose, sneezing more than normal, hoarseness, lymph nodes swollen and tender in her neck. No itchy watery eyes. Takes an OTC antihistamine daily - started on Tuesday. S/s started Tuesday evening. Going on vacation Monday. Has the patient traveled out of the country within the last 30 days? ---No Does the patient have any new or worsening symptoms? ---Yes Will a triage be completed? ---Yes Related visit to physician within the last 2 weeks? ---No Does the PT have any chronic conditions? (i.e. diabetes, asthma, etc.) ---Yes List chronic conditions. ---seasonal allergies, asthma Is the patient pregnant or possibly pregnant? (Ask all females between the ages of 10-55) ---No Is this a behavioral health or substance abuse call? ---No Guidelines Guideline Title Affirmed Question Affirmed Notes Nasal Allergies (Hay Fever) [1] Nasal allergies AND [id number] only certain times of year (hay fever) (all triage questions negative) Final Disposition [place], RN, [place] Comments Caller reassured and doesn't feel like she needs an appt any longer after speaking with nurse. RN did make her aware of Saturday hrs should she decide that she wants to be seen. Disagree/Comply: Comply

## 2015-12-16 MED FILL — VYVANSE 60 MG CAPSULE: 60 | 90 days supply | Qty: 90 | Fill #0

## 2016-01-03 MED FILL — OSELTAMIVIR PHOS 75 MG CAP: 75 | 7 days supply | Qty: 7 | Fill #0

## 2016-02-03 MED FILL — PANTOPRAZOLE SOD DR 40 MG T: 40 | 90 days supply | Qty: 90 | Fill #2

## 2016-02-21 DIAGNOSIS — F902 Attention-deficit hyperactivity disorder, combined type: Secondary | ICD-10-CM | POA: Diagnosis not present

## 2016-03-12 MED FILL — NORETHIN-ESTRAD-FERR 1-0.02: 1-20 | 84 days supply | Qty: 84 | Fill #3

## 2016-03-22 MED FILL — VYVANSE 60 MG CAPSULE: 60 | 90 days supply | Qty: 90 | Fill #0

## 2016-03-22 MED FILL — ALPRAZolam 0.5 MG TABS: 0.5 | 90 days supply | Qty: 180 | Fill #0

## 2016-05-08 MED FILL — PANTOPRAZOLE SOD DR 40 MG T: 40 | 60 days supply | Qty: 60 | Fill #3

## 2016-05-10 ENCOUNTER — Encounter: Payer: Self-pay | Admitting: Physician Assistant

## 2016-05-10 ENCOUNTER — Telehealth: Payer: 59 | Admitting: Physician Assistant

## 2016-05-10 DIAGNOSIS — J019 Acute sinusitis, unspecified: Secondary | ICD-10-CM

## 2016-05-10 DIAGNOSIS — B9689 Other specified bacterial agents as the cause of diseases classified elsewhere: Secondary | ICD-10-CM | POA: Diagnosis not present

## 2016-05-10 MED ORDER — AMOXICILLIN-POT CLAVULANATE 875-125 MG PO TABS: 1.0000 | ORAL_TABLET | Freq: Two times a day (BID) | ORAL | 0 refills | Status: DC

## 2016-05-10 NOTE — Progress Notes (Signed)

## 2016-05-11 MED FILL — AMOX TR-K CLV 875-125 MG TA: 875-125 | 7 days supply | Qty: 14 | Fill #0

## 2016-05-29 MED FILL — NORETHIN-ESTRAD-FERR 1-0.02: 1-20 | 28 days supply | Qty: 28 | Fill #0

## 2016-06-18 MED FILL — VYVANSE 60 MG CAPSULE: 60 | 90 days supply | Qty: 90 | Fill #0

## 2016-06-27 MED FILL — NORETHIN-ESTRAD-FERR 1-0.02: 1-20 | 28 days supply | Qty: 28 | Fill #0

## 2016-07-02 DIAGNOSIS — Z6823 Body mass index (BMI) 23.0-23.9, adult: Secondary | ICD-10-CM | POA: Diagnosis not present

## 2016-07-02 DIAGNOSIS — Z01419 Encounter for gynecological examination (general) (routine) without abnormal findings: Secondary | ICD-10-CM | POA: Diagnosis not present

## 2016-07-16 MED FILL — PANTOPRAZOLE SOD DR 40 MG T: 40 | 90 days supply | Qty: 90 | Fill #0

## 2016-08-01 MED FILL — NORETHIN-ESTRAD-FERR 1-0.02: 1-20 | 28 days supply | Qty: 28 | Fill #0

## 2016-08-02 DIAGNOSIS — F902 Attention-deficit hyperactivity disorder, combined type: Secondary | ICD-10-CM | POA: Diagnosis not present

## 2016-08-14 MED FILL — DROSPIRENONE-ETHINYL ESTRAD: 3-0.02 | 84 days supply | Qty: 84 | Fill #0

## 2016-09-17 MED FILL — VYVANSE 60 MG CAPSULE: 60 | 30 days supply | Qty: 30 | Fill #0

## 2016-09-17 MED FILL — ALPRAZolam 0.5 MG TABS: 0.5 | 90 days supply | Qty: 180 | Fill #0

## 2016-10-22 MED FILL — PANTOPRAZOLE SOD DR 40 MG T: 40 | 90 days supply | Qty: 90 | Fill #1

## 2016-11-05 MED FILL — VYVANSE 60 MG CAPSULE: 60 | 30 days supply | Qty: 30 | Fill #0

## 2016-11-08 ENCOUNTER — Other Ambulatory Visit: Payer: Self-pay | Admitting: Family

## 2016-11-19 MED FILL — LORYNA 3-0.02 MG TAB: 3-0.02 | 84 days supply | Qty: 84 | Fill #1

## 2016-12-11 MED FILL — VYVANSE 60 MG CAPSULE: 60 | 30 days supply | Qty: 30 | Fill #0

## 2016-12-18 MED FILL — ALPRAZolam 0.5 MG TABS: 0.5 | 90 days supply | Qty: 180 | Fill #1

## 2017-01-07 MED FILL — VYVANSE 60 MG CAPSULE: 60 | 90 days supply | Qty: 90 | Fill #0

## 2017-01-21 MED FILL — PANTOPRAZOLE SOD DR 40 MG T: 40 | 90 days supply | Qty: 90 | Fill #2

## 2017-02-01 DIAGNOSIS — F902 Attention-deficit hyperactivity disorder, combined type: Secondary | ICD-10-CM | POA: Diagnosis not present

## 2017-02-11 MED FILL — DROSPIR-ETH ESTRA 3/.02 MG: 3-0.02 | 84 days supply | Qty: 84 | Fill #2

## 2017-03-04 DIAGNOSIS — D692 Other nonthrombocytopenic purpura: Secondary | ICD-10-CM | POA: Diagnosis not present

## 2017-03-04 DIAGNOSIS — D2271 Melanocytic nevi of right lower limb, including hip: Secondary | ICD-10-CM | POA: Diagnosis not present

## 2017-03-04 DIAGNOSIS — L738 Other specified follicular disorders: Secondary | ICD-10-CM | POA: Diagnosis not present

## 2017-03-04 DIAGNOSIS — L218 Other seborrheic dermatitis: Secondary | ICD-10-CM | POA: Diagnosis not present

## 2017-03-04 DIAGNOSIS — D225 Melanocytic nevi of trunk: Secondary | ICD-10-CM | POA: Diagnosis not present

## 2017-03-04 DIAGNOSIS — K130 Diseases of lips: Secondary | ICD-10-CM | POA: Diagnosis not present

## 2017-03-04 DIAGNOSIS — D2239 Melanocytic nevi of other parts of face: Secondary | ICD-10-CM | POA: Diagnosis not present

## 2017-03-04 DIAGNOSIS — L7211 Pilar cyst: Secondary | ICD-10-CM | POA: Diagnosis not present

## 2017-03-04 MED FILL — metroNIDAZOLE 0.75 % LOTN: 0.75 | 30 days supply | Qty: 59 | Fill #0

## 2017-03-18 MED FILL — ALPRAZolam 0.5 MG TABS: 0.5 | 90 days supply | Qty: 180 | Fill #0

## 2017-04-08 MED FILL — VYVANSE 60 MG CAPSULE: 60 | 30 days supply | Qty: 30 | Fill #0

## 2017-04-17 MED FILL — PANTOPRAZOLE SOD DR 40 MG T: 40 | 60 days supply | Qty: 60 | Fill #3

## 2017-05-01 ENCOUNTER — Emergency Department
Admission: EM | Admit: 2017-05-01 | Discharge: 2017-05-01 | Disposition: A | Discharge status: Home / Self Care | Payer: 59 | Attending: Emergency Medicine | Admitting: Emergency Medicine

## 2017-05-01 ENCOUNTER — Ambulatory Visit: Payer: Self-pay | Admitting: Emergency Medicine

## 2017-05-01 ENCOUNTER — Encounter: Payer: Self-pay | Admitting: Emergency Medicine

## 2017-05-01 VITALS — BP 136/90 | HR 77 | Temp 97.9°F

## 2017-05-01 DIAGNOSIS — E119 Type 2 diabetes mellitus without complications: Secondary | ICD-10-CM | POA: Insufficient documentation

## 2017-05-01 DIAGNOSIS — N898 Other specified noninflammatory disorders of vagina: Secondary | ICD-10-CM

## 2017-05-01 DIAGNOSIS — R197 Diarrhea, unspecified: Secondary | ICD-10-CM

## 2017-05-01 DIAGNOSIS — R1030 Lower abdominal pain, unspecified: Secondary | ICD-10-CM

## 2017-05-01 DIAGNOSIS — J45909 Unspecified asthma, uncomplicated: Secondary | ICD-10-CM | POA: Insufficient documentation

## 2017-05-01 DIAGNOSIS — I10 Essential (primary) hypertension: Secondary | ICD-10-CM | POA: Diagnosis not present

## 2017-05-01 DIAGNOSIS — G43809 Other migraine, not intractable, without status migrainosus: Secondary | ICD-10-CM | POA: Diagnosis not present

## 2017-05-01 DIAGNOSIS — R109 Unspecified abdominal pain: Secondary | ICD-10-CM | POA: Diagnosis present

## 2017-05-01 DIAGNOSIS — T391X1A Poisoning by 4-Aminophenol derivatives, accidental (unintentional), initial encounter: Secondary | ICD-10-CM

## 2017-05-01 DIAGNOSIS — R51 Headache: Secondary | ICD-10-CM

## 2017-05-01 DIAGNOSIS — R519 Headache, unspecified: Secondary | ICD-10-CM

## 2017-05-01 DIAGNOSIS — K529 Noninfective gastroenteritis and colitis, unspecified: Secondary | ICD-10-CM | POA: Insufficient documentation

## 2017-05-01 DIAGNOSIS — G43A0 Cyclical vomiting, not intractable: Secondary | ICD-10-CM

## 2017-05-01 LAB — CBC
HCT: 39.5 % (ref 35.0–47.0)
Hemoglobin: 14.2 g/dL (ref 12.0–16.0)
MCH: 30 pg (ref 26.0–34.0)
MCHC: 35.8 g/dL (ref 32.0–36.0)
MCV: 83.9 fL (ref 80.0–100.0)
Platelets: 294 10*3/uL (ref 150–440)
RBC: 4.71 MIL/uL (ref 3.80–5.20)
RDW: 11.8 % (ref 11.5–14.5)
WBC: 4.4 10*3/uL (ref 3.6–11.0)

## 2017-05-01 LAB — COMPREHENSIVE METABOLIC PANEL
ALT: 24 U/L (ref 14–54)
AST: 29 U/L (ref 15–41)
Albumin: 3.9 g/dL (ref 3.5–5.0)
Alkaline Phosphatase: 56 U/L (ref 38–126)
Anion gap: 9 (ref 5–15)
BUN: 15 mg/dL (ref 6–20)
CO2: 22 mmol/L (ref 22–32)
Calcium: 8.7 mg/dL — ABNORMAL LOW (ref 8.9–10.3)
Chloride: 102 mmol/L (ref 101–111)
Creatinine, Ser: 0.75 mg/dL (ref 0.44–1.00)
GFR calc Af Amer: >60 mL/min (ref >60)
GFR calc non Af Amer: >60 mL/min (ref >60)
Glucose, Bld: 127 mg/dL — ABNORMAL HIGH (ref 65–99)
Potassium: 3.2 mmol/L — ABNORMAL LOW (ref 3.5–5.1)
Sodium: 133 mmol/L — ABNORMAL LOW (ref 135–145)
Total Bilirubin: 0.9 mg/dL (ref 0.3–1.2)
Total Protein: 7.9 g/dL (ref 6.5–8.1)

## 2017-05-01 LAB — URINALYSIS, COMPLETE (UACMP) WITH MICROSCOPIC
Bilirubin Urine: NEGATIVE
Glucose, UA: NEGATIVE mg/dL
Hgb urine dipstick: NEGATIVE
Ketones, ur: 5 mg/dL — AB
Leukocytes, UA: NEGATIVE
Nitrite: NEGATIVE
Protein, ur: 30 mg/dL — AB
Specific Gravity, Urine: 1.044 — ABNORMAL HIGH (ref 1.005–1.030)
pH: 5 (ref 5.0–8.0)

## 2017-05-01 LAB — LIPASE, BLOOD: Lipase: 41 U/L (ref 11–51)

## 2017-05-01 MED ORDER — ACETAMINOPHEN 500 MG PO TABS
1000.0000 mg | ORAL_TABLET | Freq: Once | ORAL | Status: AC
  Administered 2017-05-01: 1000 mg via ORAL
  Filled 2017-05-01: qty 2

## 2017-05-01 MED ORDER — METOCLOPRAMIDE HCL 5 MG/ML IJ SOLN
20.0000 mg | Freq: Once | INTRAVENOUS | Status: AC
  Administered 2017-05-01: 20 mg via INTRAVENOUS
  Filled 2017-05-01: qty 4

## 2017-05-01 MED ORDER — DIPHENHYDRAMINE HCL 50 MG/ML IJ SOLN
25.0000 mg | Freq: Once | INTRAMUSCULAR | Status: AC
  Administered 2017-05-01: 25 mg via INTRAVENOUS
  Filled 2017-05-01: qty 1

## 2017-05-01 MED ORDER — BUTALBITAL-APAP-CAFFEINE 50-325-40 MG PO TABS: 1.0000 | ORAL_TABLET | Freq: Four times a day (QID) | ORAL | 0 refills | Status: AC | PRN

## 2017-05-01 MED ORDER — SODIUM CHLORIDE 0.9 % IV SOLN
1000.0000 mL | Freq: Once | INTRAVENOUS | Status: AC
  Administered 2017-05-01: 1000 mL via INTRAVENOUS

## 2017-05-01 NOTE — Progress Notes (Signed)
Subjective.        Patient presents with onset Saturday of nausea vomiting back pain and diarrhea. She says she has had up to 6 stools per day. Initially there was mucus in the stool but since yesterday there has been no blood or mucus in her stools. She has Been nauseated with vomiting but has been able to tolerate some by mouth intake today of a bland diet. Today she started having a severe frontal occipital headache. She has been taking Tylenol and states that since early this morning she has taken  As many  As 10  Extra strength Tylenol.. Objective.        Patient is alert cooperative in no distress Pupils equal reactive to light with extraocular movement normal. There is no icterus noted. Throat is normal without redness or exudate. Neck is supple. Chest is clear to auscultation and percussion. Heart regular rate and rhythm. Abdomen is flat. There is tenderness in the deep right lower and left lower abdomen without rebound. Extremities without edema. Neurological. Cranial nerves intact. No focal weakness. No focal neurological signs. Assessment.        I'm very concerned about this patient. She is afebrile here today but has a significant headache along with a history of nausea vomiting and diarrhea since Saturday. She has a history of asthma and is allergic to aspirin and ibuprofen. She has been taking large quantities of acetaminophen she states up to 10 extra strength Tylenol and will need to have a Tylenol level drawn as well as liver function tests. I had a long discussion with the patient and her father. I told them she needed further evaluation at the emergency room. I told them that she would need further diagnostic testing that we are not able to perform in our office. I did not feel comfortable writing for any medications without having a more thorough evaluation. Plan.       Patient referred to the emergency room. Charge nurse Opal Sidles was called and advised of the large doses of Tylenol  that the patient had been taking as well as what was found initially here at the acute care clinic at [facility]. The patient along with her father understand it is very important to go straight to the emergency room and register and be seen and further evaluated. They understand and agree to do so. No medications were prescribed in our clinic.

## 2017-05-01 NOTE — ED Provider Notes (Signed)
[facility] Emergency Department Provider Note   ____________________________________________    I have reviewed the triage vital signs and the nursing notes.   HISTORY  Chief Complaint Abdominal Pain     HPI Caitlin Burns is a 36 y.o. female who presents with complaints of nausea vomiting diarrhea and headache.  Patient reports 3-4 days ago she developed nausea vomiting and diarrhea, this is recurred over the last couple of days but seem to be improving this morning.  However she then developed a headache which she reports is her main complaint now.  She does report a history of migraines and this feels similar.  No neuro deficits.  No significant abdominal pain currently.  No fevers or chills or neck pain.  She reports allergy to NSAIDs, no relief with Tylenol  Past Medical History:  Diagnosis Date  . Anxiety 10/06/2010  . Asthma 10/06/2010  . ATTENTION DEFICIT HYPERACTIVITY DISORDER, 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  . DEPRESSION 07/14/2007  . Diabetes mellitus without complication ([place])   . Essential hypertension, benign 03/24/2010  . GERD (gastroesophageal reflux disease)   . Postpartum care following vaginal delivery 01/27/2013  . 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 SVD (spontaneous vaginal delivery) 01/27/2013    Patient Active Problem List   Diagnosis Date Noted  . Episodic lightheadedness 06/10/2014  . Postpartum care following vaginal delivery (01/26/13) 01/27/2013  . SVD (spontaneous vaginal delivery) 01/27/2013  . GERD (gastroesophageal reflux disease) 01/17/2012  . Hip pain, bilateral 10/30/2011  . 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 Injury of shoulder, right 07/20/2011  . Preventative health care 10/06/2010  . Bilateral hearing loss 10/06/2010  . Asthma 10/06/2010  . Anxiety 10/06/2010  . ECHOCARDIOGRAM, ABNORMAL 04/17/2010  . Essential hypertension, benign 03/24/2010  . Palpitations 03/24/2010  . Bunion 03/23/2010  . ATTENTION  DEFICIT HYPERACTIVITY DISORDER, 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  . 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 VISUAL CHANGES 11/05/2007  . Dysuria 11/05/2007  . DEPRESSION 07/14/2007  . BACK PAIN 07/14/2007  . Headache(784.0) 07/14/2007    Past Surgical History:  Procedure Laterality Date  . lasix eye surgury  2004  . WISDOM TOOTH EXTRACTION      Prior to Admission medications   Medication Sig Start Date End Date Taking? Authorizing Provider  ALPRAZolam Duanne Moron) 0.5 MG tablet  03/18/17   [provider]  amoxicillin-clavulanate (AUGMENTIN) 875-125 MG tablet Take 1 tablet by mouth 2 (two) times daily. Patient not taking: Reported on 05/01/2017 05/10/16   Brunetta Jeans, PA-C  butalbital-acetaminophen-caffeine [place], [facility]) [id number] MG tablet Take 1-2 tablets by mouth every 6 (six) hours as needed for headache. 05/01/17 05/01/18  Lavonia Drafts, MD  drospirenone-ethinyl estradiol Sherrill Raring) 3-0.02 MG tablet  02/11/17   [provider]  lisdexamfetamine (VYVANSE) 60 MG capsule Take 60 mg by mouth every morning.    [provider]  pantoprazole (PROTONIX) 40 MG tablet Take 40 mg by mouth daily.    [provider]  VENTOLIN HFA 108 (90 Base) MCG/ACT inhaler INHALE 2 PUFFS INTO THE LUNGS EVERY 6 HOURS AS NEEDED FOR WHEEZING OR SHORTNESS OF BREATH. 08/24/15   [place], FNP     Allergies Advil [ibuprofen]; Aspirin; and Sulfonamide derivatives  Family History  Problem Relation Age of Onset  . Raynaud syndrome Mother   . Osteopenia Mother   . Arthritis Mother        OA, osteopenia  . Hypertension Father   . Hyperlipidemia Father   . Coronary artery  disease Father   . Diabetes Father   . Heart disease Father   . Cancer Father        melanoma  . Lung cancer Paternal Grandfather   . Brain cancer Paternal Grandfather     Social History Social History   Tobacco Use  . Smoking status: Never Smoker  . Smokeless tobacco: Never Used  Substance  Use Topics  . Alcohol use: Yes    Comment: rare  . Drug use: No    Review of Systems  Constitutional: No fever/chills Eyes: No visual changes.  ENT: No sore throat. Cardiovascular: Denies chest pain. Respiratory: Denies shortness of breath. Gastrointestinal: As above Genitourinary: Negative for dysuria. Musculoskeletal: Negative for back pain. Skin: Negative for rash. Neurological: Negative for focal weakness   ____________________________________________   PHYSICAL EXAM:  VITAL SIGNS: ED Triage Vitals  Enc Vitals Group     BP 05/01/17 1654 (!) 154/65     Pulse Rate 05/01/17 1654 78     Resp 05/01/17 1654 16     Temp 05/01/17 1654 98.2 F (36.8 C)     Temp Source 05/01/17 1654 Oral     SpO2 05/01/17 1654 98 %     Weight 05/01/17 1655 63.5 kg (140 lb)     Height 05/01/17 1655 1.676 m (5\' 6" )     Head Circumference --      Peak Flow --      Pain Score 05/01/17 1717 3     Pain Loc --      Pain Edu? --      Excl. in [place]? --     Constitutional: Alert and oriented. . Pleasant and interactive Eyes: Conjunctivae are normal.  Head: Atraumatic.  PERRLA Nose: No congestion/rhinnorhea. Mouth/Throat: Mucous membranes are moist.   Neck:  Painless ROM Cardiovascular: Normal rate, regular rhythm.  Good peripheral circulation. Respiratory: Normal respiratory effort.  No retractions.  Gastrointestinal: Soft and nontender. No distention.  No CVA tenderness.  Musculoskeletal:  Warm and well perfused Neurologic:  Normal speech and language. No gross focal neurologic deficits are appreciated.  Skin:  Skin is warm, dry and intact. No rash noted. Psychiatric: Mood and affect are normal. Speech and behavior are normal.  ____________________________________________   LABS (all labs ordered are listed, but only abnormal results are displayed)  Labs Reviewed  COMPREHENSIVE METABOLIC PANEL - Abnormal; Notable for the following components:      Result Value   Sodium 133 (*)     Potassium 3.2 (*)    Glucose, Bld 127 (*)    Calcium 8.7 (*)    All other components within normal limits  URINALYSIS, COMPLETE (UACMP) WITH MICROSCOPIC - Abnormal; Notable for the following components:   Color, Urine AMBER (*)    APPearance CLEAR (*)    Specific Gravity, Urine 1.044 (*)    Ketones, ur 5 (*)    Protein, ur 30 (*)    Bacteria, UA RARE (*)    Squamous Epithelial / LPF 6-30 (*)    All other components within normal limits  LIPASE, BLOOD  CBC  POC URINE PREG, ED   ____________________________________________  EKG  None ____________________________________________  RADIOLOGY  None ____________________________________________   PROCEDURES  Procedure(s) performed: No  Procedures   Critical Care performed: No ____________________________________________   INITIAL IMPRESSION / ASSESSMENT AND PLAN / ED COURSE  Pertinent labs & imaging results that were available during my care of the patient were reviewed by me and considered in my medical decision making (see  chart for details).  Patient well-appearing in no acute distress.  It appears as though she is recovering from a GI virus/gastroenteritis and the headache has developed separately today.  We will treat with Reglan and Benadryl and IV fluids as dehydration may be a factor.  ----------------------------------------- 7:36 PM on 05/01/2017 -----------------------------------------  Patient reports headache has improved to a 1 or 2 out of 10, p.o. Tylenol given, will discharge to allow her to rest, she agrees with this plan.  Return precautions discussed    ____________________________________________   FINAL CLINICAL IMPRESSION(S) / ED DIAGNOSES  Final diagnoses:  Gastroenteritis  Other migraine without status migrainosus, not intractable        Note:  This document was prepared using Dragon voice recognition software and may include unintentional dictation errors.    Lavonia Drafts,  MD 05/01/17 [phone]

## 2017-05-01 NOTE — ED Notes (Signed)
Pt reports that she started Saturday with N/V and lower back pain - Sunday/Monday she started with loose stools - Tues N/V subsided and cont with loose stool - today she is having headache (states she can only take tylenol and has had no relief) - pt went to employee health at [facility] and they stated they had nothing to treat her with - employee health sent her her d/t abd pain and loose stools (8-10 loose stools in 24 hours)

## 2017-05-01 NOTE — ED Notes (Signed)
POC preg negative.

## 2017-05-01 NOTE — ED Triage Notes (Signed)
Patient presents to ED via POV from home with c/o abdominal pain, headache and N/V/D.

## 2017-05-06 MED FILL — DROSPIR-ETH ESTRA 3/.02 MG: 3-0.02 | 84 days supply | Qty: 84 | Fill #3

## 2017-05-16 MED FILL — BUTALB-ACETAMIN-CAFF 50-325: 50-325-40 | 3 days supply | Qty: 20 | Fill #0

## 2017-05-16 MED FILL — VYVANSE 60 MG CAPSULE: 60 | 30 days supply | Qty: 30 | Fill #0

## 2017-06-02 ENCOUNTER — Telehealth: Payer: 59 | Admitting: Family

## 2017-06-02 DIAGNOSIS — J028 Acute pharyngitis due to other specified organisms: Secondary | ICD-10-CM | POA: Diagnosis not present

## 2017-06-02 DIAGNOSIS — B9689 Other specified bacterial agents as the cause of diseases classified elsewhere: Secondary | ICD-10-CM | POA: Diagnosis not present

## 2017-06-02 MED ORDER — PREDNISONE 5 MG PO TABS: 5.0000 mg | ORAL_TABLET | ORAL | 0 refills | Status: DC

## 2017-06-02 MED ORDER — AZITHROMYCIN 250 MG PO TABS: ORAL_TABLET | ORAL | 0 refills | Status: DC

## 2017-06-02 MED ORDER — BENZONATATE 100 MG PO CAPS: 100.0000 mg | ORAL_CAPSULE | Freq: Three times a day (TID) | ORAL | 0 refills | Status: DC | PRN

## 2017-06-02 NOTE — Progress Notes (Signed)
Thank you for the details you included in the comment boxes. Those details are very helpful in determining the best course of treatment for you and help [place] to provide the best care. Per our telephone conversation and symptom duration of 5 days total (between the sinus symptoms and chest congestion and cough symptoms), I will treat your condition with the plan below.  We are sorry that you are not feeling well.  Here is how we plan to help!  Based on your presentation I believe you most likely have A cough due to bacteria.  When patients have a fever and a productive cough with a change in color or increased sputum production, we are concerned about bacterial bronchitis.  If left untreated it can progress to pneumonia.  If your symptoms do not improve with your treatment plan it is important that you contact your provider.   I have prescribed Azithromyin 250 mg: two tablets now and then one tablet daily for 4 additonal days    In addition you may use A non-prescription cough medication called Mucinex DM: take 2 tablets every 12 hours. and A prescription cough medication called Tessalon Perles 100mg . You may take 1-2 capsules every 8 hours as needed for your cough.  Sterapred 5 mg dosepak  From your responses in the eVisit questionnaire you describe inflammation in the upper respiratory tract which is causing a significant cough.  This is commonly called Bronchitis and has four common causes:    Allergies  Viral Infections  Acid Reflux  Bacterial Infection Allergies, viruses and acid reflux are treated by controlling symptoms or eliminating the cause. An example might be a cough caused by taking certain blood pressure medications. You stop the cough by changing the medication. Another example might be a cough caused by acid reflux. Controlling the reflux helps control the cough.  USE OF BRONCHODILATOR ("RESCUE") INHALERS: There is a risk from using your bronchodilator too frequently.  The risk is  that over-reliance on a medication which only relaxes the muscles surrounding the breathing tubes can reduce the effectiveness of medications prescribed to reduce swelling and congestion of the tubes themselves.  Although you feel brief relief from the bronchodilator inhaler, your asthma may actually be worsening with the tubes becoming more swollen and filled with mucus.  This can delay other crucial treatments, such as oral steroid medications. If you need to use a bronchodilator inhaler daily, several times per day, you should discuss this with your provider.  There are probably better treatments that could be used to keep your asthma under control.     HOME CARE . Only take medications as instructed by your medical team. . Complete the entire course of an antibiotic. . Drink plenty of fluids and get plenty of rest. . Avoid close contacts especially the very young and the elderly . Cover your mouth if you cough or cough into your sleeve. . Always remember to wash your hands . A steam or ultrasonic humidifier can help congestion.   GET HELP RIGHT AWAY IF: . You develop worsening fever. . You become short of breath . You cough up blood. . Your symptoms persist after you have completed your treatment plan MAKE SURE YOU   Understand these instructions.  Will watch your condition.  Will get help right away if you are not doing well or get worse.  Your e-visit answers were reviewed by a board certified advanced clinical practitioner to complete your personal care plan.  Depending on the condition,  your plan could have included both over the counter or prescription medications. If there is a problem please reply  once you have received a response from your provider. Your safety is important to [place].  If you have drug allergies check your prescription carefully.    You can use MyChart to ask questions about today's visit, request a non-urgent call back, or ask for a work or school excuse for 24  hours related to this e-Visit. If it has been greater than 24 hours you will need to follow up with your provider, or enter a new e-Visit to address those concerns. You will get an e-mail in the next two days asking about your experience.  I hope that your e-visit has been valuable and will speed your recovery. Thank you for using e-visits.

## 2017-06-03 MED FILL — predniSONE 5 MG (21) TBPK: 5 | 6 days supply | Qty: 21 | Fill #0

## 2017-06-03 MED FILL — AZITHROMYCIN 250 MG TAB: 250 | 5 days supply | Qty: 6 | Fill #0

## 2017-06-03 MED FILL — BENZONATATE 100 MG CAP: 100 | 5 days supply | Qty: 30 | Fill #0

## 2017-06-18 MED FILL — ALPRAZolam 0.5 MG TABS: 0.5 | 90 days supply | Qty: 180 | Fill #1

## 2017-06-20 MED FILL — VYVANSE 60 MG CAPSULE: 60 | 30 days supply | Qty: 30 | Fill #0

## 2017-06-21 MED FILL — PANTOPRAZOLE SOD DR 40 MG T: 40 | 30 days supply | Qty: 30 | Fill #0

## 2017-07-03 DIAGNOSIS — Z6823 Body mass index (BMI) 23.0-23.9, adult: Secondary | ICD-10-CM | POA: Diagnosis not present

## 2017-07-03 DIAGNOSIS — Z01419 Encounter for gynecological examination (general) (routine) without abnormal findings: Secondary | ICD-10-CM | POA: Diagnosis not present

## 2017-07-18 DIAGNOSIS — F902 Attention-deficit hyperactivity disorder, combined type: Secondary | ICD-10-CM | POA: Diagnosis not present

## 2017-07-23 MED FILL — LORYNA 3-0.02 MG TABS: 3-0.02 | 84 days supply | Qty: 84 | Fill #0

## 2017-07-23 MED FILL — PANTOPRAZOLE SOD DR 40 MG T: 40 | 90 days supply | Qty: 90 | Fill #0

## 2017-07-23 MED FILL — VYVANSE 60 MG CAPSULE: 60 | 30 days supply | Qty: 30 | Fill #0

## 2017-08-15 ENCOUNTER — Telehealth: Payer: 59 | Admitting: Family

## 2017-08-15 DIAGNOSIS — J329 Chronic sinusitis, unspecified: Secondary | ICD-10-CM | POA: Diagnosis not present

## 2017-08-15 DIAGNOSIS — B9689 Other specified bacterial agents as the cause of diseases classified elsewhere: Secondary | ICD-10-CM | POA: Diagnosis not present

## 2017-08-15 MED ORDER — AMOXICILLIN-POT CLAVULANATE 875-125 MG PO TABS: 1.0000 | ORAL_TABLET | Freq: Two times a day (BID) | ORAL | 0 refills | Status: AC

## 2017-08-15 MED FILL — AMOX TR-K CLV 875-125 MG TA: 875-125 | 7 days supply | Qty: 14 | Fill #0

## 2017-08-15 NOTE — Progress Notes (Signed)

## 2017-08-26 MED FILL — VYVANSE 60 MG CAPSULE: 60 | 30 days supply | Qty: 30 | Fill #0

## 2017-09-03 ENCOUNTER — Telehealth: Payer: 59 | Admitting: Family

## 2017-09-03 ENCOUNTER — Telehealth: Payer: 59 | Admitting: Nurse Practitioner

## 2017-09-03 DIAGNOSIS — J019 Acute sinusitis, unspecified: Secondary | ICD-10-CM

## 2017-09-03 DIAGNOSIS — B9689 Other specified bacterial agents as the cause of diseases classified elsewhere: Secondary | ICD-10-CM

## 2017-09-03 DIAGNOSIS — R0789 Other chest pain: Secondary | ICD-10-CM

## 2017-09-03 DIAGNOSIS — J0101 Acute recurrent maxillary sinusitis: Secondary | ICD-10-CM

## 2017-09-03 MED ORDER — AMOXICILLIN-POT CLAVULANATE 875-125 MG PO TABS: 1.0000 | ORAL_TABLET | Freq: Two times a day (BID) | ORAL | 0 refills | Status: DC

## 2017-09-03 NOTE — Progress Notes (Signed)
Based on what you shared with me it looks like you have a serious condition that should be evaluated in a face to face office visit.  NOTE: If you entered your credit card information for this eVisit, you will not be charged. You may see a "hold" on your card for the $30 but that hold will drop off and you will not have a charge processed.  If you are having a true medical emergency please call 911.  If you need an urgent face to face visit, [facility] has four urgent care centers for your convenience.  If you need care fast and have a high deductible or no insurance consider:   [email or web address] to reserve your spot online an avoid wait times  [facility] [address], [address] [place], [place] [address] 8 am to 8 pm Monday-Friday 10 am to 4 pm Saturday-Sunday *Across the street from [employer]  [facility]  [address] [place], [address] 8 am to 5 pm Monday-Friday * In the [facility] the [facility]   The following sites will take your  insurance:  . [facility]  [phone] Get Driving Directions Find a Provider at this Location  [address] [place], [place] [address] . 10 am to 8 pm Monday-Friday . 12 pm to 8 pm Saturday-Sunday   . [facility]  [phone] Get Driving Directions Find a Provider at this Location  [address], [address] [place], [place] [address] . 8 am to 8 pm Monday-Friday . 9 am to 6 pm Saturday . 11 am to 6 pm Sunday   . [facility] [place]  [phone] Get Driving Directions  [address] [place], [place] [address] . 8 am to 8 pm Monday-Friday . 8 am to 4 pm Saturday-Sunday   Your e-visit answers were reviewed by a board certified advanced clinical practitioner to complete your personal care plan.  Thank you for using e-Visits.  

## 2017-09-03 NOTE — Progress Notes (Signed)

## 2017-09-04 MED FILL — AMOX TR-K CLV 875-125 MG TA: 875-125 | 7 days supply | Qty: 14 | Fill #0

## 2017-09-09 ENCOUNTER — Telehealth: Payer: 59 | Admitting: Family

## 2017-09-09 DIAGNOSIS — B373 Candidiasis of vulva and vagina: Secondary | ICD-10-CM

## 2017-09-09 DIAGNOSIS — B3731 Acute candidiasis of vulva and vagina: Secondary | ICD-10-CM

## 2017-09-09 MED ORDER — FLUCONAZOLE 150 MG PO TABS: 150.0000 mg | ORAL_TABLET | Freq: Once | ORAL | 0 refills | Status: AC

## 2017-09-09 MED FILL — FLUCONAZOLE 150 MG TABS: 150 | 1 days supply | Qty: 1 | Fill #0

## 2017-09-09 NOTE — Progress Notes (Signed)
Thank you for the details you included in the comment boxes. Those details are very helpful in determining the best course of treatment for you and help [place] to provide the best care. Clear discharge is uncommon with yeast infections and is more common with bacterial vaginosis. However, I will give the Diflucan as you are taking the Augmentin and may be at the very beginning stages of the infection as a side effect from Augmentin. See plan below.  We are sorry that you are not feeling well. Here is how we plan to help! Based on what you shared with me it looks like you: May have a yeast vaginosis  Vaginosis is an inflammation of the vagina that can result in discharge, itching and pain. The cause is usually a change in the normal balance of vaginal bacteria or an infection. Vaginosis can also result from reduced estrogen levels after menopause.  The most common causes of vaginosis are:   Bacterial vaginosis which results from an overgrowth of one on several organisms that are normally present in your vagina.   Yeast infections which are caused by a naturally occurring fungus called candida.   Vaginal atrophy (atrophic vaginosis) which results from the thinning of the vagina from reduced estrogen levels after menopause.   Trichomoniasis which is caused by a parasite and is commonly transmitted by sexual intercourse.  Factors that increase your risk of developing vaginosis include: Marland Kitchen Medications, such as antibiotics and steroids . Uncontrolled diabetes . Use of hygiene products such as bubble bath, vaginal spray or vaginal deodorant . Douching . Wearing damp or tight-fitting clothing . Using an intrauterine device (IUD) for birth control . Hormonal changes, such as those associated with pregnancy, birth control pills or menopause . Sexual activity . Having a sexually transmitted infection  Your treatment plan is A single Diflucan (fluconazole) 150mg  tablet once.  I have electronically sent  this prescription into the pharmacy that you have chosen.  Be sure to take all of the medication as directed. Stop taking any medication if you develop a rash, tongue swelling or shortness of breath. Mothers who are breast feeding should consider pumping and discarding their breast milk while on these antibiotics. However, there is no consensus that infant exposure at these doses would be harmful.  Remember that medication creams can weaken latex condoms. Marland Kitchen   HOME CARE:  Good hygiene may prevent some types of vaginosis from recurring and may relieve some symptoms:  . Avoid baths, hot tubs and whirlpool spas. Rinse soap from your outer genital area after a shower, and dry the area well to prevent irritation. Don't use scented or harsh soaps, such as those with deodorant or antibacterial action. Marland Kitchen Avoid irritants. These include scented tampons and pads. . Wipe from front to back after using the toilet. Doing so avoids spreading fecal bacteria to your vagina.  Other things that may help prevent vaginosis include:  Marland Kitchen Don't douche. Your vagina doesn't require cleansing other than normal bathing. Repetitive douching disrupts the normal organisms that reside in the vagina and can actually increase your risk of vaginal infection. Douching won't clear up a vaginal infection. . Use a latex condom. Both female and female latex condoms may help you avoid infections spread by sexual contact. . Wear cotton underwear. Also wear pantyhose with a cotton crotch. If you feel comfortable without it, skip wearing underwear to bed. Yeast thrives in [employer] Your symptoms should improve in the next day or two.  GET HELP RIGHT  AWAY IF:  . You have pain in your lower abdomen ( pelvic area or over your ovaries) . You develop nausea or vomiting . You develop a fever . Your discharge changes or worsens . You have persistent pain with intercourse . You develop shortness of breath, a rapid pulse, or you  faint.  These symptoms could be signs of problems or infections that need to be evaluated by a medical provider now.  MAKE SURE YOU    Understand these instructions.  Will watch your condition.  Will get help right away if you are not doing well or get worse.  Your e-visit answers were reviewed by a board certified advanced clinical practitioner to complete your personal care plan. Depending upon the condition, your plan could have included both over the counter or prescription medications. Please review your pharmacy choice to make sure that you have choses a pharmacy that is open for you to pick up any needed prescription, Your safety is important to [place]. If you have drug allergies check your prescription carefully.   You can use MyChart to ask questions about today's visit, request a non-urgent call back, or ask for a work or school excuse for 24 hours related to this e-Visit. If it has been greater than 24 hours you will need to follow up with your provider, or enter a new e-Visit to address those concerns. You will get a MyChart message within the next two days asking about your experience. I hope that your e-visit has been valuable and will speed your recovery.

## 2017-09-13 MED FILL — ALPRAZolam 0.5 MG TABS: 0.5 | 90 days supply | Qty: 180 | Fill #0

## 2017-09-23 MED FILL — VYVANSE 60 MG CAPSULE: 60 | 30 days supply | Qty: 30 | Fill #0

## 2017-10-17 MED FILL — LORYNA 3-0.02 MG TABS: 3-0.02 | 84 days supply | Qty: 84 | Fill #1

## 2017-10-28 MED FILL — PANTOPRAZOLE SOD DR 40 MG T: 40 | 90 days supply | Qty: 90 | Fill #1

## 2017-10-29 MED FILL — VYVANSE 60 MG CAPSULE: 60 | 30 days supply | Qty: 30 | Fill #0

## 2017-11-27 ENCOUNTER — Ambulatory Visit
Admission: EM | Admit: 2017-11-27 | Discharge: 2017-11-27 | Disposition: A | Discharge status: Home / Self Care | Payer: 59 | Attending: Internal Medicine | Admitting: Internal Medicine

## 2017-11-27 ENCOUNTER — Other Ambulatory Visit: Payer: Self-pay

## 2017-11-27 ENCOUNTER — Encounter: Payer: Self-pay | Admitting: Emergency Medicine

## 2017-11-27 ENCOUNTER — Telehealth: Payer: 59 | Admitting: Family

## 2017-11-27 DIAGNOSIS — S80869A Insect bite (nonvenomous), unspecified lower leg, initial encounter: Secondary | ICD-10-CM

## 2017-11-27 DIAGNOSIS — W57XXXA Bitten or stung by nonvenomous insect and other nonvenomous arthropods, initial encounter: Secondary | ICD-10-CM | POA: Diagnosis not present

## 2017-11-27 MED ORDER — DOXYCYCLINE HYCLATE 100 MG PO TABS: 200.0000 mg | ORAL_TABLET | Freq: Once | ORAL | 0 refills | Status: AC

## 2017-11-27 MED FILL — DOXYCYCLINE HYCLATE 100 MG: 100 | 1 days supply | Qty: 2 | Fill #0

## 2017-11-27 MED FILL — VYVANSE 60 MG CAPSULE: 60 | 30 days supply | Qty: 30 | Fill #0

## 2017-11-27 NOTE — ED Triage Notes (Signed)
Patient removed a tick on her left lower leg yesterday.  Patient did a cone e-visit and started her Doxycycline today.

## 2017-11-27 NOTE — Progress Notes (Signed)
Thank you for describing your tick bite, Here is how we plan to help! Based on the information that you shared with me it looks like you have An uncomplicated tick bite that just occurred and can be closely follow using the instructions in your care plan.  In most cases a tick bite is painless and does not itch.  Most tick bites in which the tick is quickly removed do not require prescriptions. Ticks can transmit several diseases if they are infected and remain attacked to your skin. Therefore the length that the tick was attached and any symptoms you have experienced after the bite are import to accurately develop your custom treatment plan. In most cases a single dose of doxycycline may prevent the development of a more serious condition.  Based on your information I have Provided a home care guide for tick bites  and  instructions on when to call for help. and I have sent a single dose of doxycycline to the pharmacy you selected. Please make sure that you selected a pharmacy that is open now.   You will have to follow up with your PCP to have lab work drawn.  Which ticks  are associated with illness?  The Wood Tick (dog tick) is the size of a watermelon seed and can sometimes transmit [facility] spotted fever and [place] tick fever.   The Deer Tick (black-legged tick) is between the size of a poppy seed (pin head) and an apple seed, and can sometimes transmit Lyme disease.  A brown to black tick with a white splotch on its back is likely a female Amblyomma americanum ([place] tick). This tick has been associated with Southern Tick Associated illness ( STARI)  Lyme disease has become the most common tick-borne illness in the [place]. The risk of Lyme disease following a recognized deer tick bite is estimated to be 1%.  The majority of cases of Lyme disease start with a bull's eye rash at the site of the tick bite. The rash can occur days to weeks (typically 7-10 days) after a tick  bite. Treatment with antibiotics is indicated if this rash appears. Flu-like symptoms may accompany the rash, including: fever, chills, headaches, muscle aches, and fatigue. Removing ticks promptly may prevent tick borne disease.  What can be used to prevent Tick Bites?   Insect repellant with at leas 20% DEET.  Wearing long pants with sock and shoes.  Avoiding tall grass and heavily wooded areas.  Checking your skin after being outdoors.  Shower with a washcloth after outdoor exposures.  HOME CARE ADVICE FOR TICK BITE  1. Wood Tick Removal:  o Use a pair of tweezers and grasp the wood tick close to the skin (on its head). Pull the wood tick straight upward without twisting or crushing it. Maintain a steady pressure until it releases its grip.   o If tweezers aren't available, use fingers, a loop of thread around the jaws, or a needle between the jaws for traction.  o Note: covering the tick with petroleum jelly, nail polish or rubbing alcohol doesn't work. Neither does touching the tick with a hot or cold object. 2. Tiny Deer Tick Removal:   o Needs to be scraped off with a knife blade or credit card edge. o Place tick in a sealed container (e.g. glass jar, zip lock plastic bag), in case your doctor wants to see it. 3. Tick's Head Removal:  o If the wood tick's head breaks off in the  skin, it must be removed. Clean the skin. Then use a sterile needle to uncover the head and lift it out or scrape it off.  o If a very small piece of the head remains, the skin will eventually slough it off. 4. Antibiotic Ointment:  o Wash the wound and your hands with soap and water after removal to prevent catching any tick disease.  Apply an over the counter antibiotic ointment (e.g. bacitracin) to the bite once. 5. Expected Course: Tick bites normally don't itch or hurt. That's why they often go unnoticed. 6. Call Your Doctor If:  o You can't remove the tick or the tick's head o Fever, a severe head  ache, or rash occur in the next 2 weeks o Bite begins to look infected o Lyme's disease is common in your area o You have not had a tetanus in the last 10 years o Your current symptoms become worse    MAKE SURE YOU   Understand these instructions.  Will watch your condition.  Will get help right away if you are not doing well or get worse.   Thank you for choosing an e-visit.  Your e-visit answers were reviewed by a board certified advanced clinical practitioner to complete your personal care plan. Depending upon the condition, your plan could have included both over the counter or prescription medications. Please review your pharmacy choice. If there is a problem you may use MyChart messaging to have the prescription routed to another pharmacy. Your safety is important to [place]. If you have drug allergies check your prescription carefully.   You can use MyChart to ask questions about today's visit, request a non-urgent call back, or ask for a work or school excuse for 24 hours related to this e-Visit. If it has been greater than 24 hours you will need to follow up with your provider, or enter a new e-Visit to address those concerns.  You will get an email in the next two days asking about your experience. I hope  that your e-visit has been valuable and will speed your recovery

## 2017-12-18 MED FILL — ALPRAZolam 0.5 MG TABS: 0.5 | 90 days supply | Qty: 180 | Fill #1

## 2017-12-25 MED FILL — VYVANSE 60 MG CAPSULE: 60 | 30 days supply | Qty: 30 | Fill #0

## 2018-01-02 DIAGNOSIS — F902 Attention-deficit hyperactivity disorder, combined type: Secondary | ICD-10-CM | POA: Diagnosis not present

## 2018-01-10 MED FILL — metroNIDAZOLE 0.75 % LOTN: 0.75 | 30 days supply | Qty: 59 | Fill #1

## 2018-01-10 MED FILL — LORYNA 3-0.02 MG TABS: 3-0.02 | 84 days supply | Qty: 84 | Fill #2

## 2018-01-27 MED FILL — PANTOPRAZOLE SOD DR 40 MG T: 40 | 30 days supply | Qty: 30 | Fill #2

## 2018-02-24 MED FILL — PANTOPRAZOLE SOD DR 40 MG T: 40 | 90 days supply | Qty: 90 | Fill #0

## 2018-02-28 MED FILL — VYVANSE 60 MG CAPSULE: 60 | 30 days supply | Qty: 30 | Fill #0

## 2018-04-28 ENCOUNTER — Telehealth: Payer: Self-pay | Admitting: Psychiatry

## 2018-04-28 NOTE — Telephone Encounter (Signed)
Patient and staff leave message from 04/25/2018 that patient needs to pick up or receive in mail her Vyvanse 60 mg every morning #30 with no refill as written prescription no contraindication including on  registry for controlled substances for interim to next appointment.

## 2018-05-12 DIAGNOSIS — L7211 Pilar cyst: Secondary | ICD-10-CM | POA: Diagnosis not present

## 2018-05-12 DIAGNOSIS — L718 Other rosacea: Secondary | ICD-10-CM | POA: Diagnosis not present

## 2018-05-12 DIAGNOSIS — D225 Melanocytic nevi of trunk: Secondary | ICD-10-CM | POA: Diagnosis not present

## 2018-05-18 DIAGNOSIS — F411 Generalized anxiety disorder: Secondary | ICD-10-CM | POA: Insufficient documentation

## 2018-05-18 DIAGNOSIS — F410 Panic disorder [episodic paroxysmal anxiety] without agoraphobia: Secondary | ICD-10-CM | POA: Insufficient documentation

## 2018-05-18 DIAGNOSIS — F902 Attention-deficit hyperactivity disorder, combined type: Secondary | ICD-10-CM | POA: Insufficient documentation

## 2018-06-10 ENCOUNTER — Telehealth: Payer: Self-pay | Admitting: Psychiatry

## 2018-06-10 DIAGNOSIS — F902 Attention-deficit hyperactivity disorder, combined type: Secondary | ICD-10-CM

## 2018-06-10 MED ORDER — LISDEXAMFETAMINE DIMESYLATE 60 MG PO CAPS
60.0000 mg | ORAL_CAPSULE | Freq: Every day | ORAL | 0 refills | Status: DC
Start: 2018-06-10 — End: 2018-06-19

## 2018-06-10 NOTE — Telephone Encounter (Signed)
PT IS REQUESTING VYVANSE REFILL.  SHE IS SCHEDULED 2/13.  PLEASE SEND TO [facility] [place]. I'LL GET YOU HER CHART - NOT SEEN IN Epic YET

## 2018-06-10 NOTE — Telephone Encounter (Signed)
Patient contacted office due for Vyvanse 60 mg every morning #30 no refill as of several weeks before sent to [facility] pharmacy medically necessary with no contraindication for interim to next appointment 06/19/2018

## 2018-06-19 ENCOUNTER — Encounter: Payer: Self-pay | Admitting: Psychiatry

## 2018-06-19 ENCOUNTER — Ambulatory Visit (INDEPENDENT_AMBULATORY_CARE_PROVIDER_SITE_OTHER): Payer: 59 | Admitting: Psychiatry

## 2018-06-19 VITALS — BP 126/92 | HR 60 | Ht 66.0 in | Wt 155.0 lb

## 2018-06-19 DIAGNOSIS — F410 Panic disorder [episodic paroxysmal anxiety] without agoraphobia: Secondary | ICD-10-CM

## 2018-06-19 DIAGNOSIS — F902 Attention-deficit hyperactivity disorder, combined type: Secondary | ICD-10-CM

## 2018-06-19 DIAGNOSIS — F411 Generalized anxiety disorder: Secondary | ICD-10-CM | POA: Diagnosis not present

## 2018-06-19 MED ORDER — LISDEXAMFETAMINE DIMESYLATE 20 MG PO CAPS: 20.0000 mg | ORAL_CAPSULE | Freq: Every day | ORAL | 0 refills | Status: DC

## 2018-06-19 MED ORDER — ALPRAZOLAM 0.5 MG PO TABS: 0.5000 mg | ORAL_TABLET | Freq: Two times a day (BID) | ORAL | 1 refills | Status: DC | PRN

## 2018-06-19 NOTE — Progress Notes (Signed)
[facility]  Patient ID: Caitlin Burns,  MRN: [id number]  PCP: Patient, No Pcp Per  Date of Evaluation: 06/19/2018 Time spent:20 minutes  Chief Complaint:  Chief Complaint    Anxiety; ADHD      HISTORY/CURRENT STATUS: Caitlin Burns is seen individually face-to-face with consent not collateral for psychiatric interview and exam in 29-month evaluation and management of ADHD and GAD/panic anxiety with interim stressors of changing job location and assignment to be closer to home.  Last Vyvanse fill was in the interim 06/10/2018 now sent to [facility] outpatient pharmacy.  With the restructuring of work, she request to stop Vyvanse gradually by behavioral adjustments to see if she has less anxiety off the stimulant as long as ADHD does not exacerbate.  She discusses all options and issues noting her increased exercise but anticipates continued need for Xanax for the next 6 months.  She has no substance use, depression, or misperceptions.  Anxiety  Presents for follow-up visit. Symptoms include excessive worry, nervous/anxious behavior, obsessions, restlessness and suicidal ideas. Patient reports no chest pain, decreased concentration, depressed mood, feeling of choking, hyperventilation, insomnia, irritability, muscle tension, palpitations, panic or shortness of breath. Symptoms occur most days. The severity of symptoms is moderate. The quality of sleep is fair. Nighttime awakenings: occasional.   Compliance with medications is 76-100%.    Individual Medical History/ Review of Systems: Changes? :Yes She is more willing to discuss generalized anxiety having little panic now but much more high anxiety in a generalized pattern.  Allergies: Advil [ibuprofen]; Aspirin; and Sulfonamide derivatives  Current Medications:  Current Outpatient Medications:  .  ALPRAZolam (XANAX) 0.5 MG tablet, Take 1 tablet (0.5 mg total) by mouth 2 (two) times daily as needed for anxiety., Disp: 180 tablet, Rfl:  1 .  drospirenone-ethinyl estradiol (YAZ,GIANVI,LORYNA) 3-0.02 MG tablet, , Disp: , Rfl: 11 .  lisdexamfetamine (VYVANSE) 20 MG capsule, Take 1 capsule (20 mg total) by mouth daily after breakfast for 30 days., Disp: 30 capsule, Rfl: 0 .  pantoprazole (PROTONIX) 40 MG tablet, Take 40 mg by mouth daily., Disp: , Rfl:  .  VENTOLIN HFA 108 (90 Base) MCG/ACT inhaler, INHALE 2 PUFFS INTO THE LUNGS EVERY 6 HOURS AS NEEDED FOR WHEEZING OR SHORTNESS OF BREATH., Disp: 18 g, Rfl: 3 .  [START ON 07/19/2018] lisdexamfetamine (VYVANSE) 20 MG capsule, Take 1 capsule (20 mg total) by mouth daily after breakfast for 30 days., Disp: 30 capsule, Rfl: 0 Medication Side Effects: none  Family Medical/ Social History: Changes? Yes patient is on birth control pill with weight gain as she seeks for husband to obtain vasectomy so she can discontinue the BCP.  She appreciates new job with more defined responsibilities in the [employer] able to schedule her day and use the improved exercise equipment opportunities there at [facility].  MENTAL HEALTH EXAM: Muscle strengths and tone 5/5, postural reflexes and gait 0/0, and AIMS = 0. Blood pressure (!) 126/92, pulse 60, height 5\' 6"  (1.676 m), weight 155 lb (70.3 kg).Body mass index is 25.02 kg/m.  General Appearance: Casual, Fairly Groomed and Guarded  Eye Contact:  Fair  Speech:  Clear and Coherent and Talkative  Volume:  Normal  Mood:  Anxious, Euthymic and Worthless  Affect:  Labile, Full Range and Anxious  Thought Process:  Goal Directed and Linear  Orientation:  Full (Time, Place, and Person)  Thought Content: Obsessions   Suicidal Thoughts:  No  Homicidal Thoughts:  No  Memory:  Immediate;   Good Remote;  Good  Judgement:  Fair  Insight:  Fair  Psychomotor Activity:  Normal and Increased  Concentration:  Concentration: Fair and Attention Span: Fair  Recall:  Good  Fund of Knowledge: Good  Language: Good  Assets:   Resilience Talents/Skills Vocational/Educational  ADL's:  Intact  Cognition: WNL  Prognosis:  Good    DIAGNOSES:    ICD-10-CM   1. Attention deficit hyperactivity disorder, combined type F90.2 lisdexamfetamine (VYVANSE) 20 MG capsule  2. Generalized anxiety disorder F41.1 ALPRAZolam (XANAX) 0.5 MG tablet  3. Panic disorder F41.0 ALPRAZolam (XANAX) 0.5 MG tablet  4. Attention deficit hyperactivity disorder (ADHD), combined type F90.2 lisdexamfetamine (VYVANSE) 20 MG capsule    Receiving Psychotherapy: No  EACP at [facility] as needed   RECOMMENDATIONS: eScription for Xanax 0.5 mg twice daily as needed for anxiety is sent to [facility] outpatient for #180 with 1 refill for panic and GAD.  Vyvanse 20 mg as 2 or 1 every morning for 1 to 2 months to taper from 60 mg is E scribed to [facility] outpatient #30 each for February and March to then discontinue Vyvanse and return here in 6 months.   Delight Hoh, MD

## 2018-07-14 ENCOUNTER — Ambulatory Visit
Admission: EM | Admit: 2018-07-14 | Discharge: 2018-07-14 | Disposition: A | Discharge status: Home / Self Care | Payer: 59 | Attending: Family Medicine | Admitting: Family Medicine

## 2018-07-14 ENCOUNTER — Other Ambulatory Visit: Payer: Self-pay

## 2018-07-14 DIAGNOSIS — J111 Influenza due to unidentified influenza virus with other respiratory manifestations: Secondary | ICD-10-CM

## 2018-07-14 DIAGNOSIS — R509 Fever, unspecified: Secondary | ICD-10-CM | POA: Diagnosis not present

## 2018-07-14 DIAGNOSIS — R0981 Nasal congestion: Secondary | ICD-10-CM | POA: Diagnosis not present

## 2018-07-14 DIAGNOSIS — R5383 Other fatigue: Secondary | ICD-10-CM

## 2018-07-14 DIAGNOSIS — R69 Illness, unspecified: Principal | ICD-10-CM

## 2018-07-14 MED ORDER — ALBUTEROL SULFATE HFA 108 (90 BASE) MCG/ACT IN AERS: 1.0000 | INHALATION_SPRAY | Freq: Four times a day (QID) | RESPIRATORY_TRACT | 0 refills | Status: DC | PRN

## 2018-07-14 MED ORDER — OSELTAMIVIR PHOSPHATE 75 MG PO CAPS: 75.0000 mg | ORAL_CAPSULE | Freq: Two times a day (BID) | ORAL | 0 refills | Status: DC

## 2018-07-14 NOTE — ED Provider Notes (Signed)
[facility]    CSN: [id number] Arrival date & time: 07/14/18  0802     History   Chief Complaint Chief Complaint  Patient presents with  . Generalized Body Aches    HPI Caitlin Burns is a 38 y.o. female.   The history is provided by the patient.  URI  Presenting symptoms: congestion, fatigue, fever and rhinorrhea   Severity:  Moderate Onset quality:  Sudden Duration:  2 days Timing:  Constant Progression:  Unchanged Chronicity:  New Relieved by:  None tried Ineffective treatments:  None tried Associated symptoms: headaches and myalgias   Risk factors: sick contacts     Past Medical History:  Diagnosis Date  . Anxiety 10/06/2010  . Asthma 10/06/2010  . ATTENTION DEFICIT HYPERACTIVITY DISORDER, 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  . DEPRESSION 07/14/2007  . Diabetes mellitus without complication ([place])   . Essential hypertension, benign 03/24/2010  . GERD (gastroesophageal reflux disease)   . Postpartum care following vaginal delivery 01/27/2013  . 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 SVD (spontaneous vaginal delivery) 01/27/2013    Patient Active Problem List   Diagnosis Date Noted  . Panic disorder without agoraphobia 05/18/2018  . Generalized anxiety disorder 05/18/2018  . Attention deficit hyperactivity disorder, combined type 05/18/2018  . Episodic lightheadedness 06/10/2014  . Postpartum care following vaginal delivery (01/26/13) 01/27/2013  . SVD (spontaneous vaginal delivery) 01/27/2013  . GERD (gastroesophageal reflux disease) 01/17/2012  . Hip pain, bilateral 10/30/2011  . 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 Injury of shoulder, right 07/20/2011  . Preventative health care 10/06/2010  . Bilateral hearing loss 10/06/2010  . Asthma 10/06/2010  . ECHOCARDIOGRAM, ABNORMAL 04/17/2010  . Essential hypertension, benign 03/24/2010  . Palpitations 03/24/2010  . Bunion 03/23/2010  . 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 VISUAL CHANGES 11/05/2007  .  Dysuria 11/05/2007  . BACK PAIN 07/14/2007  . Headache(784.0) 07/14/2007    Past Surgical History:  Procedure Laterality Date  . lasix eye surgury  2004  . WISDOM TOOTH EXTRACTION      OB History    Gravida  1   Para  1   Term  1   Preterm  0   AB  0   Living  1     SAB  0   TAB  0   Ectopic  0   Multiple  0   Live Births  1            Home Medications    Prior to Admission medications   Medication Sig Start Date End Date Taking? Authorizing Provider  ALPRAZolam Duanne Moron) 0.5 MG tablet Take 1 tablet (0.5 mg total) by mouth 2 (two) times daily as needed for anxiety. 06/19/18  Yes Delight Hoh, MD  drospirenone-ethinyl estradiol Sherrill Raring) 3-0.02 MG tablet  02/11/17  Yes [provider]  lisdexamfetamine (VYVANSE) 20 MG capsule Take 1 capsule (20 mg total) by mouth daily after breakfast for 30 days. 06/19/18 07/19/18 Yes Delight Hoh, MD  lisdexamfetamine (VYVANSE) 20 MG capsule Take 1 capsule (20 mg total) by mouth daily after breakfast for 30 days. 07/19/18 08/18/18 Yes Delight Hoh, MD  pantoprazole (PROTONIX) 40 MG tablet Take 40 mg by mouth daily.   Yes [provider]  albuterol (PROVENTIL HFA;VENTOLIN HFA) 108 (90 Base) MCG/ACT inhaler Inhale 1-2 puffs into the lungs every 6 (six) hours as needed for wheezing or shortness of breath. 07/14/18   Norval Gable, MD  oseltamivir (TAMIFLU) 75 MG capsule  Take 1 capsule (75 mg total) by mouth 2 (two) times daily. 07/14/18   Norval Gable, MD    Family History Family History  Problem Relation Age of Onset  . Raynaud syndrome Mother   . Osteopenia Mother   . Arthritis Mother        OA, osteopenia  . Hypertension Father   . Hyperlipidemia Father   . Coronary artery disease Father   . Diabetes Father   . Heart disease Father   . Cancer Father        melanoma  . Lung cancer Paternal Grandfather   . Brain cancer Paternal Grandfather     Social History Social History    Tobacco Use  . Smoking status: Never Smoker  . Smokeless tobacco: Never Used  Substance Use Topics  . Alcohol use: Yes    Comment: rare  . Drug use: No     Allergies   Advil [ibuprofen]; Aspirin; and Sulfonamide derivatives   Review of Systems Review of Systems  Constitutional: Positive for fatigue and fever.  HENT: Positive for congestion and rhinorrhea.   Musculoskeletal: Positive for myalgias.  Neurological: Positive for headaches.     Physical Exam Triage Vital Signs ED Triage Vitals  Enc Vitals Group     BP 07/14/18 0823 (!) 141/101     Pulse Rate 07/14/18 0823 (!) 109     Resp 07/14/18 0823 18     Temp 07/14/18 0823 99 F (37.2 C)     Temp Source 07/14/18 0823 Oral     SpO2 07/14/18 0823 97 %     Weight 07/14/18 0819 155 lb (70.3 kg)     Height 07/14/18 0819 5\' 6"  (1.676 m)     Head Circumference --      Peak Flow --      Pain Score 07/14/18 0819 5     Pain Loc --      Pain Edu? --      Excl. in [place]? --    No data found.  Updated Vital Signs BP (!) 141/101 (BP Location: Left Arm)   Pulse (!) 109   Temp 99 F (37.2 C) (Oral)   Resp 18   Ht 5\' 6"  (1.676 m)   Wt 70.3 kg   LMP 06/24/2018   SpO2 97%   BMI 25.02 kg/m   Visual Acuity Right Eye Distance:   Left Eye Distance:   Bilateral Distance:    Right Eye Near:   Left Eye Near:    Bilateral Near:     Physical Exam Vitals signs and nursing note reviewed.  Constitutional:      General: She is not in acute distress.    Appearance: She is well-developed. She is not toxic-appearing or diaphoretic.  HENT:     Head: Normocephalic and atraumatic.     Right Ear: Tympanic membrane, ear canal and external ear normal.     Left Ear: Tympanic membrane, ear canal and external ear normal.     Nose: Rhinorrhea present.     Mouth/Throat:     Pharynx: Uvula midline. No oropharyngeal exudate.  Eyes:     General: No scleral icterus.       Right eye: No discharge.        Left eye: No discharge.  Neck:      Musculoskeletal: Normal range of motion and neck supple.     Thyroid: No thyromegaly.  Cardiovascular:     Rate and Rhythm: Normal rate and regular rhythm.  Heart sounds: Normal heart sounds.  Pulmonary:     Effort: Pulmonary effort is normal. No respiratory distress.     Breath sounds: Normal breath sounds. No stridor. No wheezing, rhonchi or rales.  Lymphadenopathy:     Cervical: No cervical adenopathy.  Neurological:     Mental Status: She is alert.      UC Treatments / Results  Labs (all labs ordered are listed, but only abnormal results are displayed) Labs Reviewed - No data to display  EKG None  Radiology No results found.  Procedures Procedures (including critical care time)  Medications Ordered in UC Medications - No data to display  Initial Impression / Assessment and Plan / UC Course  I have reviewed the triage vital signs and the nursing notes.  Pertinent labs & imaging results that were available during my care of the patient were reviewed by me and considered in my medical decision making (see chart for details).      Final Clinical Impressions(s) / UC Diagnoses   Final diagnoses:  Influenza-like illness    ED Prescriptions    Medication Sig Dispense Auth. Provider   oseltamivir (TAMIFLU) 75 MG capsule Take 1 capsule (75 mg total) by mouth 2 (two) times daily. 10 capsule Norval Gable, MD   albuterol (PROVENTIL HFA;VENTOLIN HFA) 108 (90 Base) MCG/ACT inhaler Inhale 1-2 puffs into the lungs every 6 (six) hours as needed for wheezing or shortness of breath. 1 Inhaler Conty, Linward Foster, MD     1. diagnosis reviewed with patient 2. rx as per orders above; reviewed possible side effects, interactions, risks and benefits  3. Recommend supportive treatment with rest, fluids, otc meds  4. Follow-up prn if symptoms worsen or don't improve   Controlled Substance Prescriptions [place] Controlled Substance Registry consulted? Not Applicable   Norval Gable, MD 07/14/18 1208

## 2018-07-14 NOTE — ED Triage Notes (Signed)
Patient complains of fever, body aches, cough. States that she has a history of asthma. Patient states that this feels different than a flare of asthma.

## 2018-07-28 MED FILL — PANTOPRAZOLE SOD DR 40 MG T: 40 | 30 days supply | Qty: 30 | Fill #0

## 2018-08-06 DIAGNOSIS — Z6825 Body mass index (BMI) 25.0-25.9, adult: Secondary | ICD-10-CM | POA: Diagnosis not present

## 2018-08-06 DIAGNOSIS — Z1151 Encounter for screening for human papillomavirus (HPV): Secondary | ICD-10-CM | POA: Diagnosis not present

## 2018-08-06 DIAGNOSIS — Z01419 Encounter for gynecological examination (general) (routine) without abnormal findings: Secondary | ICD-10-CM | POA: Diagnosis not present

## 2018-08-06 MED FILL — DROSPIR-ETH ESTRA 3/.02 MG: 3-0.02 | 84 days supply | Qty: 84 | Fill #0

## 2018-08-19 MED FILL — PANTOPRAZOLE SOD DR 40 MG T: 40 | 90 days supply | Qty: 90 | Fill #0 | Status: TO

## 2018-10-15 ENCOUNTER — Telehealth: Payer: Self-pay | Admitting: Psychiatry

## 2018-10-15 DIAGNOSIS — F902 Attention-deficit hyperactivity disorder, combined type: Secondary | ICD-10-CM

## 2018-10-15 MED ORDER — BUPROPION HCL ER (XL) 150 MG PO TB24: 150.0000 mg | ORAL_TABLET | Freq: Every day | ORAL | 2 refills | Status: DC

## 2018-10-15 NOTE — Telephone Encounter (Signed)
Patient called and said that came off the adhd medicine 2 months ago. She said that you discussed at last visit about her going on an anti depressent. She would like to try wellbutrin. Her next appt is in august. If she needs to be seen before the start of wellbutrin. Please call her at [phone]

## 2018-10-15 NOTE — Telephone Encounter (Signed)
Though not documented in chart including last appointment in February, as patient wanted off of Vyvanse but still had inattention and impulsive irritability at times, we discussed Wellbutrin sent as 150 mg XL every morning #30 with 2 refills in place of previous Vyvanse completely tapered to discontinuation at 20 mg daily to [place] medically necessary with no contraindication.

## 2018-12-05 MED FILL — PANTOPRAZOLE SOD DR 40 MG T: 40 | 90 days supply | Qty: 90 | Fill #0

## 2018-12-16 DIAGNOSIS — M25551 Pain in right hip: Secondary | ICD-10-CM | POA: Diagnosis not present

## 2018-12-16 DIAGNOSIS — M222X2 Patellofemoral disorders, left knee: Secondary | ICD-10-CM | POA: Diagnosis not present

## 2018-12-18 ENCOUNTER — Ambulatory Visit (INDEPENDENT_AMBULATORY_CARE_PROVIDER_SITE_OTHER): Payer: 59 | Admitting: Psychiatry

## 2018-12-18 ENCOUNTER — Other Ambulatory Visit: Payer: Self-pay

## 2018-12-18 ENCOUNTER — Encounter: Payer: Self-pay | Admitting: Psychiatry

## 2018-12-18 VITALS — Ht 65.5 in | Wt 156.0 lb

## 2018-12-18 DIAGNOSIS — F410 Panic disorder [episodic paroxysmal anxiety] without agoraphobia: Secondary | ICD-10-CM | POA: Diagnosis not present

## 2018-12-18 DIAGNOSIS — F902 Attention-deficit hyperactivity disorder, combined type: Secondary | ICD-10-CM | POA: Diagnosis not present

## 2018-12-18 DIAGNOSIS — F411 Generalized anxiety disorder: Secondary | ICD-10-CM | POA: Diagnosis not present

## 2018-12-18 MED ORDER — ALPRAZOLAM 0.5 MG PO TABS: 0.5000 mg | ORAL_TABLET | Freq: Two times a day (BID) | ORAL | 1 refills | Status: DC | PRN

## 2018-12-18 MED ORDER — BUPROPION HCL ER (XL) 150 MG PO TB24: 150.0000 mg | ORAL_TABLET | Freq: Every day | ORAL | 1 refills | Status: DC

## 2018-12-18 NOTE — Progress Notes (Signed)
[facility]  Patient ID: CALLEY DRENNING,  MRN: [id number]  PCP: Patient, No Pcp Per  Date of Evaluation: 12/18/2018 Time spent:15 minutes from 3[place] to [place]  Chief Complaint:  Chief Complaint    Anxiety; Panic Attack; ADHD      HISTORY/CURRENT STATUS: Ednamae is seen onsite in office face-to-face with consent individually with epic collateral for psychiatric interview and exam in 73-month evaluation and management of comorbid anxiety and ADHD.  She manifests an idealized adaptation to job change to the [employer] at [employer], son starting virtual kindergarten while daughter is starting high school, and she is off Vyvanse on Wellbutrin but continuing Xanax.  She and family did spend a week at [facility] [place] in [facility] with patient able to relax now.  Off Vyvanse, she seems less anxious finding attention span sufficient on Wellbutrin and mood and anxiety better.  She has no substance use, accident or injury.  Driving is excellent and job performance is excellent.  Weight has normalized, and she appears more capable and comfortable.  [place] registry documents last Xanax fill 09/30/2018 for #180 or 90-day supply of Xanax.  She is more social as she is less anxious and likely more capable on the job.  Wellbutrin helps focus, mood, and anxiety by review.  She has no psychosis, mania, suicidality, or delirium.  Anxiety        Presents for follow-up visit. Symptoms include worry, nervous/anxious behavior, obsessions, minimal panic, and restlessness. Patient reports no chest pain, decreased concentration, depressed mood, no feeling of choking, no hyperventilation, no suicidal ideas, no insomnia, no irritability, no muscle tension, no palpitations, or shortness of breath. Symptoms occur most days. The severity of symptoms is moderate. The quality of sleep is fair. Nighttime awakenings: occasional. Compliance with medications is 76-100%.   Individual Medical History/ Review of Systems: Changes?  :Yes BP with influenza 07/14/2018 was 141/101 despite Vyvanse dosing reduction from 60 to 20 mg daily on February 9  when BP on 60 mg daily was 126/92  Allergies: Advil [ibuprofen], Aspirin, and Sulfonamide derivatives  Current Medications:  Current Outpatient Medications:  .  albuterol (PROVENTIL HFA;VENTOLIN HFA) 108 (90 Base) MCG/ACT inhaler, Inhale 1-2 puffs into the lungs every 6 (six) hours as needed for wheezing or shortness of breath., Disp: 1 Inhaler, Rfl: 0 .  ALPRAZolam (XANAX) 0.5 MG tablet, Take 1 tablet (0.5 mg total) by mouth 2 (two) times daily as needed for anxiety., Disp: 180 tablet, Rfl: 1 .  buPROPion (WELLBUTRIN XL) 150 MG 24 hr tablet, Take 1 tablet (150 mg total) by mouth daily after breakfast., Disp: 90 tablet, Rfl: 1 .  drospirenone-ethinyl estradiol (YAZ,GIANVI,LORYNA) 3-0.02 MG tablet, , Disp: , Rfl: 11 .  oseltamivir (TAMIFLU) 75 MG capsule, Take 1 capsule (75 mg total) by mouth 2 (two) times daily., Disp: 10 capsule, Rfl: 0 .  pantoprazole (PROTONIX) 40 MG tablet, Take 40 mg by mouth daily., Disp: , Rfl:   Medication Side Effects: none except likely anxiety and possibly slight increased blood pressure on Vyvanse dose 60 mg daily last appointment 06/15/2018  Family Medical/ Social History: Changes? Yes she describes all of household doing better including children and husband  MENTAL HEALTH EXAM:  Height 5' 5.5" (1.664 m), weight 156 lb (70.8 kg).Body mass index is 25.56 kg/m.  BP last appointment 126/92 and on 07/14/2018 was 141/101  General Appearance: Casual, Meticulous and Well Groomed  Eye Contact:  Good  Speech:  Clear and Coherent, Normal Rate and Talkative  Volume:  Normal  Mood:  Anxious and Euthymic  Affect:  Non-Congruent, Inappropriate, Labile, Restricted and Anxious  Thought Process:  Goal Directed, Irrelevant and Linear  Orientation:  Full (Time, Place, and Person)  Thought Content: Obsessions and Rumination   Suicidal Thoughts:  No  Homicidal  Thoughts:  No  Memory:  Immediate;   Good Remote;   Good  Judgement:  Good  Insight:  Fair  Psychomotor Activity:  Normal, Mannerisms and Restlessness  Concentration:  Concentration: Good and Attention Span: Fair  Recall:  Good  Fund of Knowledge: Good  Language: Fair  Assets:  Desire for Improvement Resilience Talents/Skills Vocational/Educational  ADL's:  Intact  Cognition: WNL  Prognosis:  Good    DIAGNOSES:    ICD-10-CM   1. Panic disorder  F41.0 ALPRAZolam (XANAX) 0.5 MG tablet  2. Generalized anxiety disorder  F41.1 ALPRAZolam (XANAX) 0.5 MG tablet  3. Attention deficit hyperactivity disorder, combined type  F90.2 buPROPion (WELLBUTRIN XL) 150 MG 24 hr tablet    Receiving Psychotherapy: No    RECOMMENDATIONS: Over 50% of the time is spent in counseling and coordination of care addressing previous hierarchy of anxious symptoms for mechanisms of recovery many of which she has now mastered matching symptom treatment with cognitive behavioral nutrition, sleep hygiene, social skills, and problem-solving.  She seeks her maintenance medication refills including Wellbutrin 150 mg XL every morning #90 with 1 refill sent to [facility] health care employee pharmacy for GAD and panic.  Wellbutrin is continued 150 mg XL every morning sent as a 90-day supply and 1 refill to [facility] outpatient pharmacy for GAD and ADHD.  As she continues Xanax, she returns here in 6 months for follow-up.   Delight Hoh, MD

## 2019-06-18 ENCOUNTER — Ambulatory Visit (INDEPENDENT_AMBULATORY_CARE_PROVIDER_SITE_OTHER): Payer: 59 | Admitting: Psychiatry

## 2019-06-18 ENCOUNTER — Telehealth: Payer: Self-pay | Admitting: Psychiatry

## 2019-06-18 ENCOUNTER — Encounter: Payer: Self-pay | Admitting: Psychiatry

## 2019-06-18 ENCOUNTER — Other Ambulatory Visit: Payer: Self-pay

## 2019-06-18 VITALS — Ht 65.5 in | Wt 170.0 lb

## 2019-06-18 DIAGNOSIS — F902 Attention-deficit hyperactivity disorder, combined type: Secondary | ICD-10-CM | POA: Diagnosis not present

## 2019-06-18 DIAGNOSIS — F411 Generalized anxiety disorder: Secondary | ICD-10-CM | POA: Diagnosis not present

## 2019-06-18 DIAGNOSIS — F410 Panic disorder [episodic paroxysmal anxiety] without agoraphobia: Secondary | ICD-10-CM

## 2019-06-18 MED ORDER — ALPRAZOLAM 0.5 MG PO TABS: 0.5000 mg | ORAL_TABLET | Freq: Two times a day (BID) | ORAL | 1 refills | Status: DC | PRN

## 2019-06-18 MED ORDER — LISDEXAMFETAMINE DIMESYLATE 60 MG PO CAPS: 60.0000 mg | ORAL_CAPSULE | Freq: Every day | ORAL | 0 refills | Status: DC

## 2019-06-18 MED ORDER — BUPROPION HCL ER (XL) 150 MG PO TB24: 150.0000 mg | ORAL_TABLET | Freq: Every day | ORAL | 1 refills | Status: DC

## 2019-06-18 NOTE — Telephone Encounter (Signed)
error 

## 2019-06-18 NOTE — Progress Notes (Signed)
[facility]  Patient ID: SESHA SZEWCZYK,  MRN: [id number]  PCP: Patient, No Pcp Per  Date of Evaluation: 06/18/2019 Time spent:20 minutes [id number] to 76  Chief Complaint:  Chief Complaint    ADHD; Anxiety      HISTORY/CURRENT STATUS: Caitlin Burns is seen onsite in office 20 minutes face-to-face individually with consent with epic collateral for psychiatric interview and exam in 20-month evaluation and management of panic and generalized anxiety, ADHD, and work and family stressors. The patient reports modest anxiety but easy irritability having no panic but feeling vulnerable to anger outbursts.  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  The patient does continue her Wellbutrin and as needed Xanax with [place] registry documenting last Xanax fill 04/22/2019 mainly at bedtime and as needed otherwise often no more than once daily last supply of 180 tablets or 51-month supply.  The patient stopped her birth control pill finding no relief of fatigue or low energy so she restarted it.  She has become more mindful that focus and impulse control are more difficult such that with all the considerations, she wonders about restarting Vyvanse.  She takes Protonix for GERD and albuterol inhaler as needed for asthma not currently a problem.  Weight is up 14 pounds.  She has no mania, suicidality, psychosis or delirium.  Anxiety        Presents forfollow-upvisit. Symptoms include worry,nervous/anxious behavior, fatigue somatically, low mental energy, impulse control deficits, poor concentration, and restlessness. Patient reports nochest pain,no depressed mood, no obsessions, no panic, nofeeling of choking,no hyperventilation, no insomnia,no suicidal ideas,no irritability,no muscle tension,no  palpitations, or shortness of breath. Symptoms occurmost days. The severity of symptoms ismoderate. The quality of sleep isfair. Nighttime awakenings:occasional. Compliance with medications is76-100%.  Individual Medical History/ Review of Systems: Changes? :Yes   Allergies: Advil [ibuprofen], Aspirin, and Sulfonamide derivatives  Current Medications:  Current Outpatient Medications:  .  albuterol (PROVENTIL HFA;VENTOLIN HFA) 108 (90 Base) MCG/ACT inhaler, Inhale 1-2 puffs into the lungs every 6 (six) hours as needed for wheezing or shortness of breath., Disp: 1 Inhaler, Rfl: 0 .  ALPRAZolam (XANAX) 0.5 MG tablet, Take 1 tablet (0.5 mg total) by mouth 2 (two) times daily as needed for anxiety., Disp: 180 tablet, Rfl: 1 .  buPROPion (WELLBUTRIN XL) 150 MG 24 hr tablet, Take 1 tablet (150 mg total) by mouth daily after breakfast., Disp: 90 tablet, Rfl: 1 .  drospirenone-ethinyl estradiol (YAZ,GIANVI,LORYNA) 3-0.02 MG tablet, , Disp: , Rfl: 11 .  lisdexamfetamine (VYVANSE) 60 MG capsule, Take 1 capsule (60 mg total) by mouth daily after breakfast., Disp: 30 capsule, Rfl: 0 .  [START ON 07/18/2019] lisdexamfetamine (VYVANSE) 60 MG capsule, Take 1 capsule (60 mg total) by mouth daily after breakfast., Disp: 30 capsule, Rfl: 0 .  [START ON 08/17/2019] lisdexamfetamine (VYVANSE) 60 MG capsule, Take 1 capsule (60 mg total) by mouth daily after breakfast., Disp: 30 capsule, Rfl: 0 .  oseltamivir (TAMIFLU) 75 MG capsule, Take 1 capsule (75 mg total) by mouth 2 (two) times daily., Disp: 10 capsule, Rfl: 0 .  pantoprazole (PROTONIX) 40 MG tablet, Take 40 mg by mouth daily., Disp: , Rfl:   Medication Side Effects: none  Family Medical/ Social History: Changes? No as patient relies on Dr. Ronita Hipps [facility] to provide primary care restarting her birth control  pill having weight gain of 14 pounds in 6 months with much less anxiety equivalents but more ADHD.  She has Protonix daily for GERD and rare albuterol  inhaler for asthma.  MENTAL HEALTH EXAM:  Height 5' 5.5" (1.664 m), weight 170 lb (77.1 kg).Body mass index is 27.86 kg/m. Muscle strengths and tone 5/5, postural reflexes and gait 0/0, and AIMS = 0 otherwise deferred for coronavirus shutdown  General Appearance: Casual and Fairly Groomed  Eye Contact:  Good  Speech:  Clear and Coherent, Normal Rate and Talkative  Volume:  Normal  Mood:  Anxious and Euthymic  Affect:  Congruent, Inappropriate, Labile and Anxious  Thought Process:  Coherent, Goal Directed, Irrelevant, Linear and Descriptions of Associations: Tangential  Orientation:  Full (Time, Place, and Person)  Thought Content: Rumination and Tangential   Suicidal Thoughts:  No  Homicidal Thoughts:  No  Memory:  Immediate;   Good Remote;   Good  Judgement:  Fair  Insight:  Fair  Psychomotor Activity:  Normal, Increased, Decreased, Mannerisms and Restlessness  Concentration:  Concentration: Fair and Attention: Poor  Recall:  [place] of Knowledge: Good  Language: Good  Assets:  Desire for Improvement Intimacy Leisure Time Physical Health Talents/Skills  ADL's:  Intact  Cognition: WNL  Prognosis:  Good    DIAGNOSES:    ICD-10-CM   1. Attention deficit hyperactivity disorder, combined type  F90.2 buPROPion (WELLBUTRIN XL) 150 MG 24 hr tablet    lisdexamfetamine (VYVANSE) 60 MG capsule    lisdexamfetamine (VYVANSE) 60 MG capsule    lisdexamfetamine (VYVANSE) 60 MG capsule  2. Generalized anxiety disorder  F41.1 buPROPion (WELLBUTRIN XL) 150 MG 24 hr tablet    ALPRAZolam (XANAX) 0.5 MG tablet  3. Panic disorder  F41.0 ALPRAZolam (XANAX) 0.5 MG tablet    Receiving Psychotherapy: No    RECOMMENDATIONS: Psychosupportive psychoeducation updates prevention and monitoring and safety hygiene as patient experiences more vulnerability to impulsive outbursts than apprehensive avoidance or panic shutdown.  With anxiety more stable though possibly partially due to Wellbutrin,  Xanax can be continued and Vyvanse restarted best if Wellbutrin is at least initially maintained.  Patient is agreeable as she wishes to feel and function more effectively in all of her roles, as she expects she will reduce her weight again by diet and exercise.  She restarts Vyvanse E scribed 60 mg every morning sent as a month supply each to [facility] outpatient pharmacy for February 11, March 13, and April 12 for ADHD.  She continues Wellbutrin 150 mg XL every morning sent as #90 with 1 refill to [facility] outpatient pharmacy for ADHD and generalized anxiety though understanding it can be discontinued in the interim if no need for treatment for anxiety or around-the-clock inattention or impulse control.  She is E scribed Xanax 0.5 mg twice daily as needed for insomnia, panic, and generalized anxiety sent as #180 with 1 refill to [facility] outpatient pharmacy.  She returns in 6 months for follow up or sooner if needed.   Delight Hoh, MD

## 2019-07-24 ENCOUNTER — Ambulatory Visit (INDEPENDENT_AMBULATORY_CARE_PROVIDER_SITE_OTHER)
Admission: RE | Admit: 2019-07-24 | Discharge: 2019-07-24 | Disposition: A | Discharge status: Home / Self Care | Payer: 59 | Source: Ambulatory Visit

## 2019-07-24 DIAGNOSIS — G43009 Migraine without aura, not intractable, without status migrainosus: Secondary | ICD-10-CM | POA: Diagnosis not present

## 2019-07-24 MED ORDER — BUTALBITAL-APAP-CAFFEINE 50-325-40 MG PO TABS: 1.0000 | ORAL_TABLET | Freq: Four times a day (QID) | ORAL | 0 refills | Status: DC | PRN

## 2019-07-24 MED ORDER — ONDANSETRON HCL 4 MG PO TABS: 4.0000 mg | ORAL_TABLET | Freq: Three times a day (TID) | ORAL | 0 refills | Status: DC | PRN

## 2019-07-24 NOTE — ED Provider Notes (Signed)
Virtual Visit via Video Note:  Caitlin Burns  initiated request for Telemedicine visit with [facility] team. I connected with Caitlin Burns  on 07/24/2019 at 10:39 AM  for a synchronized telemedicine visit using a video enabled HIPPA compliant telemedicine application. I verified that I am speaking with Caitlin Burns  using two identifiers. Zigmund Gottron, NP  was physically located in a [facility] Urgent care site and Caitlin Burns was located at a different location.   The limitations of evaluation and management by telemedicine as well as the availability of in-person appointments were discussed. Patient was informed that she  may incur a bill ( including co-pay) for this virtual visit encounter. Caitlin Burns  expressed understanding and gave verbal consent to proceed with virtual visit.     History of Present Illness:Caitlin Burns  is a 39 y.o. female presents with complaints of migraine which started yesterday morning. She has had migraines previously but typically doesn't get them regularly or frequently. She has been taking tylenol which helps some but hasn't completely resolved headache. In December of 2018 she was given fioricet which helped at that time. She still had some left over but expired, took one last night which didn't necessarily help. Headache feels similar in quality as previous migraines. Right head. Nausea, no vomiting. Light and sound sensitivity. Stress tends to trigger, she endorses increased stress recently. No head injury. No vision changes or dizziness. She is unable to take nsaids.    Past Medical History:  Diagnosis Date  . Anxiety 10/06/2010  . Asthma 10/06/2010  . ATTENTION DEFICIT HYPERACTIVITY DISORDER, 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  . DEPRESSION 07/14/2007  . Diabetes mellitus without complication ([place])   . Essential hypertension, benign 03/24/2010  . GERD (gastroesophageal reflux disease)   . Postpartum care following vaginal delivery 01/27/2013   . 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 SVD (spontaneous vaginal delivery) 01/27/2013    Allergies  Allergen Reactions  . Advil [Ibuprofen] Other (See Comments)    Stroke like ibuprofen  . Aspirin Hives and Rash  . Sulfonamide Derivatives Hives and Rash        Observations/Objective: Alert, oriented, non toxic in appearance. Clear coherent speech without difficulty. No increased work of breathing visualized.    Assessment and Plan: fioricet provided. Benadryl recommended with zofran as well to promote sleep to abort headache. In person precautions provided. Patient verbalized understanding and agreeable to plan.    Follow Up Instructions:    I discussed the assessment and treatment plan with the patient. The patient was provided an opportunity to ask questions and all were answered. The patient agreed with the plan and demonstrated an understanding of the instructions.   The patient was advised to call back or seek an in-person evaluation if the symptoms worsen or if the condition fails to improve as anticipated.  I provided 15 minutes of non-face-to-face time during this encounter.    Zigmund Gottron, NP  07/24/2019 10:39 AM         Zigmund Gottron, NP 07/26/19 [phone]

## 2019-07-24 NOTE — Discharge Instructions (Signed)
Max of 4000mg  of tylenol/ acetaminophen in a 24 hour period- just watch with taking plain tylenol and the prescribed medication.  May take Benadryl as well to promote sleep and help with headache.  Rest in quiet dark room. Limit screen time.  Drink plenty of water to ensure adequate hydration.   Zofran every 8 hours as needed for nausea or vomiting.   If no improvement or if worsening please be seen in person for further evaluation and treatment.

## 2019-09-24 DIAGNOSIS — Z6824 Body mass index (BMI) 24.0-24.9, adult: Secondary | ICD-10-CM | POA: Diagnosis not present

## 2019-09-24 DIAGNOSIS — Z01419 Encounter for gynecological examination (general) (routine) without abnormal findings: Secondary | ICD-10-CM | POA: Diagnosis not present

## 2019-09-24 DIAGNOSIS — G43909 Migraine, unspecified, not intractable, without status migrainosus: Secondary | ICD-10-CM | POA: Diagnosis not present

## 2019-09-24 DIAGNOSIS — Z13220 Encounter for screening for lipoid disorders: Secondary | ICD-10-CM | POA: Diagnosis not present

## 2019-09-24 DIAGNOSIS — K219 Gastro-esophageal reflux disease without esophagitis: Secondary | ICD-10-CM | POA: Diagnosis not present

## 2019-09-28 ENCOUNTER — Other Ambulatory Visit: Payer: Self-pay | Admitting: Psychiatry

## 2019-09-28 ENCOUNTER — Ambulatory Visit
Admission: EM | Admit: 2019-09-28 | Discharge: 2019-09-28 | Disposition: A | Discharge status: Home / Self Care | Payer: 59 | Attending: Emergency Medicine | Admitting: Emergency Medicine

## 2019-09-28 DIAGNOSIS — R0602 Shortness of breath: Secondary | ICD-10-CM | POA: Diagnosis not present

## 2019-09-28 DIAGNOSIS — R519 Headache, unspecified: Secondary | ICD-10-CM | POA: Diagnosis not present

## 2019-09-28 DIAGNOSIS — F902 Attention-deficit hyperactivity disorder, combined type: Secondary | ICD-10-CM

## 2019-09-28 DIAGNOSIS — I10 Essential (primary) hypertension: Secondary | ICD-10-CM | POA: Diagnosis not present

## 2019-09-28 NOTE — Telephone Encounter (Signed)
Next apt 12/23/2019

## 2019-09-28 NOTE — ED Provider Notes (Signed)
Caitlin Burns    CSN: [id number] Arrival date & time: 09/28/19  1528      History   Chief Complaint Chief Complaint  Patient presents with  . Migraine  . Hypertension    HPI Caitlin Burns is a 39 y.o. female.   Patient presents with intermittent headache x5 days.  She also reports intermittent shortness of breath and dizziness.  Her blood pressure has been elevated at home.  She has a history of hypertension and was on medication several years ago.  She denies focal weakness, facial droop, speech difficulty, numbness, chest pain, edema, fever, nausea, diaphoresis, or other symptoms.  She states she took a previously prescribed Fioricet last night with moderate relief of her headache.  Patient declines COVID test today; she has had both vaccines.   The history is provided by the patient.    Past Medical History:  Diagnosis Date  . Anxiety 10/06/2010  . Asthma 10/06/2010  . ATTENTION DEFICIT HYPERACTIVITY DISORDER, 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  . DEPRESSION 07/14/2007  . Diabetes mellitus without complication ([place])    gestational  . Essential hypertension, benign 03/24/2010  . GERD (gastroesophageal reflux disease)   . Postpartum care following vaginal delivery 01/27/2013  . 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 SVD (spontaneous vaginal delivery) 01/27/2013    Patient Active Problem List   Diagnosis Date Noted  . Panic disorder 05/18/2018  . Generalized anxiety disorder 05/18/2018  . Attention deficit hyperactivity disorder, combined type 05/18/2018  . Episodic lightheadedness 06/10/2014  . Postpartum care following vaginal delivery (01/26/13) 01/27/2013  . SVD (spontaneous vaginal delivery) 01/27/2013  . GERD (gastroesophageal reflux disease) 01/17/2012  . Hip pain, bilateral 10/30/2011  . 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 Injury of shoulder, right 07/20/2011  . Preventative health care 10/06/2010  . Bilateral hearing loss 10/06/2010  . Asthma 10/06/2010  .  ECHOCARDIOGRAM, ABNORMAL 04/17/2010  . Essential hypertension, benign 03/24/2010  . Palpitations 03/24/2010  . Bunion 03/23/2010  . 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 VISUAL CHANGES 11/05/2007  . Dysuria 11/05/2007  . BACK PAIN 07/14/2007  . Headache(784.0) 07/14/2007    Past Surgical History:  Procedure Laterality Date  . lasix eye surgury  2004  . WISDOM TOOTH EXTRACTION      OB History    Gravida  1   Para  1   Term  1   Preterm  0   AB  0   Living  1     SAB  0   TAB  0   Ectopic  0   Multiple  0   Live Births  1            Home Medications    Prior to Admission medications   Medication Sig Start Date End Date Taking? Authorizing Provider  albuterol (PROVENTIL HFA;VENTOLIN HFA) 108 (90 Base) MCG/ACT inhaler Inhale 1-2 puffs into the lungs every 6 (six) hours as needed for wheezing or shortness of breath. 07/14/18   Norval Gable, MD  ALPRAZolam Duanne Moron) 0.5 MG tablet Take 1 tablet (0.5 mg total) by mouth 2 (two) times daily as needed for anxiety. 06/18/19   Delight Hoh, MD  buPROPion (WELLBUTRIN XL) 150 MG 24 hr tablet Take 1 tablet (150 mg total) by mouth daily after breakfast. 06/18/19   Delight Hoh, MD  butalbital-acetaminophen-caffeine (FIORICET) [phone] MG tablet Take 1 tablet by mouth every 6 (six) hours as needed for headache or migraine. 07/24/19 07/23/20  Zigmund Gottron,  NP  drospirenone-ethinyl estradiol (YAZ,GIANVI,LORYNA) 3-0.02 MG tablet  02/11/17   [provider]  lisdexamfetamine (VYVANSE) 60 MG capsule Take 1 capsule (60 mg total) by mouth daily after breakfast. 06/18/19 07/18/19  Delight Hoh, MD  lisdexamfetamine (VYVANSE) 60 MG capsule Take 1 capsule (60 mg total) by mouth daily after breakfast. 07/18/19 08/17/19  Delight Hoh, MD  lisdexamfetamine (VYVANSE) 60 MG capsule Take 1 capsule (60 mg total) by mouth daily after breakfast. 08/17/19 09/16/19  Delight Hoh, MD  ondansetron (ZOFRAN) 4 MG tablet  Take 1 tablet (4 mg total) by mouth every 8 (eight) hours as needed for nausea or vomiting. 07/24/19   Zigmund Gottron, NP  oseltamivir (TAMIFLU) 75 MG capsule Take 1 capsule (75 mg total) by mouth 2 (two) times daily. 07/14/18   Norval Gable, MD  pantoprazole (PROTONIX) 40 MG tablet Take 40 mg by mouth daily.    [provider]    Family History Family History  Problem Relation Age of Onset  . Raynaud syndrome Mother   . Osteopenia Mother   . Arthritis Mother        OA, osteopenia  . Hypertension Father   . Hyperlipidemia Father   . Coronary artery disease Father   . Diabetes Father   . Heart disease Father   . Cancer Father        melanoma  . Lung cancer Paternal Grandfather   . Brain cancer Paternal Grandfather     Social History Social History   Tobacco Use  . Smoking status: Never Smoker  . Smokeless tobacco: Never Used  Substance Use Topics  . Alcohol use: Yes    Comment: rare  . Drug use: No     Allergies   Advil [ibuprofen], Aspirin, and Sulfonamide derivatives   Review of Systems Review of Systems  Constitutional: Negative for chills, diaphoresis and fever.  HENT: Negative for congestion, ear pain and sore throat.   Eyes: Negative for pain and visual disturbance.  Respiratory: Positive for shortness of breath. Negative for cough.   Cardiovascular: Negative for chest pain and palpitations.  Gastrointestinal: Negative for abdominal pain, diarrhea, nausea and vomiting.  Genitourinary: Negative for dysuria and hematuria.  Musculoskeletal: Negative for arthralgias and back pain.  Skin: Negative for color change and rash.  Neurological: Positive for dizziness and headaches. Negative for seizures, syncope, facial asymmetry, speech difficulty, weakness and numbness.  All other systems reviewed and are negative.    Physical Exam Triage Vital Signs ED Triage Vitals [09/28/19 1529]  Enc Vitals Group     BP      Pulse      Resp      Temp      Temp  src      SpO2      Weight      Height      Head Circumference      Peak Flow      Pain Score 4     Pain Loc      Pain Edu?      Excl. in [place]?    No data found.  Updated Vital Signs BP (!) 157/100 (BP Location: Left Arm)   Pulse 91   Temp 98.1 F (36.7 C) (Oral)   Resp 14   LMP 08/30/2019 (Within Days)   SpO2 98%   Visual Acuity Right Eye Distance:   Left Eye Distance:   Bilateral Distance:    Right Eye Near:   Left Eye Near:  Bilateral Near:     Physical Exam Vitals and nursing note reviewed.  Constitutional:      General: She is not in acute distress.    Appearance: She is well-developed. She is not ill-appearing.  HENT:     Head: Normocephalic and atraumatic.     Right Ear: Tympanic membrane normal.     Left Ear: Tympanic membrane normal.     Mouth/Throat:     Mouth: Mucous membranes are moist.     Pharynx: Oropharynx is clear.  Eyes:     Extraocular Movements: Extraocular movements intact.     Conjunctiva/sclera: Conjunctivae normal.     Pupils: Pupils are equal, round, and reactive to light.  Cardiovascular:     Rate and Rhythm: Normal rate and regular rhythm.     Heart sounds: Normal heart sounds. No murmur.  Pulmonary:     Effort: Pulmonary effort is normal. No respiratory distress.     Breath sounds: Normal breath sounds. No wheezing or rhonchi.  Abdominal:     General: Bowel sounds are normal.     Palpations: Abdomen is soft.     Tenderness: There is no abdominal tenderness. There is no guarding or rebound.  Musculoskeletal:     Cervical back: Neck supple.     Right lower leg: No edema.     Left lower leg: No edema.  Skin:    General: Skin is warm and dry.     Findings: No rash.  Neurological:     General: No focal deficit present.     Mental Status: She is alert and oriented to person, place, and time.     Cranial Nerves: No cranial nerve deficit.     Sensory: No sensory deficit.     Motor: No weakness.     Coordination: Coordination  normal.     Gait: Gait normal.  Psychiatric:        Mood and Affect: Mood normal.        Behavior: Behavior normal.      UC Treatments / Results  Labs (all labs ordered are listed, but only abnormal results are displayed) Labs Reviewed  COMPREHENSIVE METABOLIC PANEL  CBC    EKG   Radiology No results found.  Procedures Procedures (including critical care time)  Medications Ordered in UC Medications - No data to display  Initial Impression / Assessment and Plan / UC Course  I have reviewed the triage vital signs and the nursing notes.  Pertinent labs & imaging results that were available during my care of the patient were reviewed by me and considered in my medical decision making (see chart for details).   Acute non-intractable headache.  Shortness of breath.  Elevated blood pressure with known hypertension.  EKG shows sinus rhythm, rate 78, no ST elevation, compared to previous from 2016.  CBC and CMP pending.  Instructed patient to call her PCP for an appointment as soon as possible.  Instructed her to keep a log of her blood pressure readings.  Instructed her to go to the emergency department if she has acute worsening symptoms.  Patient agrees to plan of care.      Final Clinical Impressions(s) / UC Diagnoses   Final diagnoses:  Shortness of breath  Acute nonintractable headache, unspecified headache type  Elevated blood pressure reading in office with diagnosis of hypertension     Discharge Instructions     Your lab results will be back tomorrow morning.    Call your primary care provider  to schedule an appointment for a follow-up visit as soon as possible.  Keep a log of your blood pressure readings and take this with you to your primary care provider.    Go to the emergency department if you have acute worsening symptoms, including severe headache, very high blood pressure, acute shortness of breath, or other concerning symptoms.        ED  Prescriptions    None     I have reviewed the PDMP during this encounter.   Sharion Balloon, NP 09/28/19 [phone]

## 2019-09-28 NOTE — ED Triage Notes (Signed)
C/o intermittent migraine since Thursday. Reports BP has been elevated since at least last night.

## 2019-09-28 NOTE — Discharge Instructions (Addendum)
Your lab results will be back tomorrow morning.    Call your primary care provider to schedule an appointment for a follow-up visit as soon as possible.  Keep a log of your blood pressure readings and take this with you to your primary care provider.    Go to the emergency department if you have acute worsening symptoms, including severe headache, very high blood pressure, acute shortness of breath, or other concerning symptoms.

## 2019-09-29 LAB — COMPREHENSIVE METABOLIC PANEL
ALT: 7 IU/L (ref 0–32)
AST: 10 IU/L (ref 0–40)
Albumin/Globulin Ratio: 1.2 (ref 1.2–2.2)
Albumin: 4.3 g/dL (ref 3.8–4.8)
Alkaline Phosphatase: 57 IU/L (ref 48–121)
BUN/Creatinine Ratio: 17 (ref 9–23)
BUN: 13 mg/dL (ref 6–20)
Bilirubin Total: 0.2 mg/dL (ref 0.0–1.2)
CO2: 20 mmol/L (ref 20–29)
Calcium: 9.6 mg/dL (ref 8.7–10.2)
Chloride: 101 mmol/L (ref 96–106)
Creatinine, Ser: 0.77 mg/dL (ref 0.57–1.00)
GFR calc Af Amer: 113 mL/min/{1.73_m2} (ref >59)
GFR calc non Af Amer: 98 mL/min/{1.73_m2} (ref >59)
Globulin, Total: 3.5 g/dL (ref 1.5–4.5)
Glucose: 70 mg/dL (ref 65–99)
Potassium: 3.3 mmol/L — ABNORMAL LOW (ref 3.5–5.2)
Sodium: 140 mmol/L (ref 134–144)
Total Protein: 7.8 g/dL (ref 6.0–8.5)

## 2019-09-29 LAB — CBC
Hematocrit: 38.2 % (ref 34.0–46.6)
Hemoglobin: 13.4 g/dL (ref 11.1–15.9)
MCH: 30.8 pg (ref 26.6–33.0)
MCHC: 35.1 g/dL (ref 31.5–35.7)
MCV: 88 fL (ref 79–97)
Platelets: 320 10*3/uL (ref 150–450)
RBC: 4.35 x10E6/uL (ref 3.77–5.28)
RDW: 12.5 % (ref 11.7–15.4)
WBC: 8.1 10*3/uL (ref 3.4–10.8)

## 2019-09-30 ENCOUNTER — Telehealth: Payer: Self-pay | Admitting: Emergency Medicine

## 2019-09-30 NOTE — Telephone Encounter (Signed)
Left message for patient to call back to discuss lab results. 

## 2019-10-27 ENCOUNTER — Other Ambulatory Visit: Payer: Self-pay | Admitting: Obstetrics and Gynecology

## 2019-10-28 ENCOUNTER — Other Ambulatory Visit: Payer: Self-pay | Admitting: Psychiatry

## 2019-10-28 DIAGNOSIS — F902 Attention-deficit hyperactivity disorder, combined type: Secondary | ICD-10-CM

## 2019-10-28 NOTE — Telephone Encounter (Signed)
Next apt 08/18

## 2019-11-06 ENCOUNTER — Ambulatory Visit (INDEPENDENT_AMBULATORY_CARE_PROVIDER_SITE_OTHER)
Admission: RE | Admit: 2019-11-06 | Discharge: 2019-11-06 | Disposition: A | Discharge status: Home / Self Care | Payer: 59 | Source: Ambulatory Visit | Attending: Family Medicine | Admitting: Family Medicine

## 2019-11-06 ENCOUNTER — Ambulatory Visit: Payer: 59 | Admitting: Family Medicine

## 2019-11-06 ENCOUNTER — Other Ambulatory Visit: Payer: Self-pay

## 2019-11-06 ENCOUNTER — Other Ambulatory Visit: Payer: Self-pay | Admitting: Family Medicine

## 2019-11-06 ENCOUNTER — Encounter: Payer: Self-pay | Admitting: Family Medicine

## 2019-11-06 VITALS — BP 130/86 | HR 89 | Temp 97.7°F | Ht 65.5 in | Wt 145.6 lb

## 2019-11-06 DIAGNOSIS — G43009 Migraine without aura, not intractable, without status migrainosus: Secondary | ICD-10-CM | POA: Diagnosis not present

## 2019-11-06 DIAGNOSIS — I7300 Raynaud's syndrome without gangrene: Secondary | ICD-10-CM

## 2019-11-06 DIAGNOSIS — F902 Attention-deficit hyperactivity disorder, combined type: Secondary | ICD-10-CM | POA: Diagnosis not present

## 2019-11-06 DIAGNOSIS — G8929 Other chronic pain: Secondary | ICD-10-CM

## 2019-11-06 DIAGNOSIS — K219 Gastro-esophageal reflux disease without esophagitis: Secondary | ICD-10-CM

## 2019-11-06 DIAGNOSIS — T753XXA Motion sickness, initial encounter: Secondary | ICD-10-CM

## 2019-11-06 DIAGNOSIS — J4520 Mild intermittent asthma, uncomplicated: Secondary | ICD-10-CM | POA: Diagnosis not present

## 2019-11-06 DIAGNOSIS — Z8679 Personal history of other diseases of the circulatory system: Secondary | ICD-10-CM

## 2019-11-06 DIAGNOSIS — M533 Sacrococcygeal disorders, not elsewhere classified: Secondary | ICD-10-CM | POA: Diagnosis not present

## 2019-11-06 MED ORDER — SCOPOLAMINE 1 MG/3DAYS TD PT72: 1.0000 | MEDICATED_PATCH | TRANSDERMAL | 3 refills | Status: DC

## 2019-11-06 MED ORDER — SCOPOLAMINE 1 MG/3DAYS TD PT72: 1.0000 | MEDICATED_PATCH | TRANSDERMAL | 0 refills | Status: DC

## 2019-11-06 MED ORDER — CYCLOBENZAPRINE HCL 5 MG PO TABS: 5.0000 mg | ORAL_TABLET | Freq: Two times a day (BID) | ORAL | 1 refills | Status: AC | PRN

## 2019-11-06 MED ORDER — ALBUTEROL SULFATE HFA 108 (90 BASE) MCG/ACT IN AERS: 1.0000 | INHALATION_SPRAY | Freq: Four times a day (QID) | RESPIRATORY_TRACT | 3 refills | Status: DC | PRN

## 2019-11-06 NOTE — Progress Notes (Signed)
This visit was conducted in person.  BP 130/86 (BP Location: Left Arm, Patient Position: Sitting, Cuff Size: Normal)   Pulse 89   Temp 97.7 F (36.5 C) (Temporal)   Ht 5' 5.5" (1.664 m)   Wt 145 lb 9 oz (66 kg)   LMP 11/02/2019   SpO2 99%   BMI 23.85 kg/m    CC: new pt to establish care Subjective:    Patient ID: Caitlin Burns, female    DOB: [birth year], 39 y.o.   MRN: [id number]  HPI: Caitlin Burns is a 39 y.o. female presenting on 11/06/2019 for New Patient (Initial Visit)   Daughter of Caitlin Burns.  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   Worsening motion sickness over the last few weeks, even noted when getting a facial. More trouble when driving or looking at youTube. Upcoming trip to [place] - asks about scopolamine patch.  Meclizine overly sedating.   Asthma - notes worsening this past year. Needing albuterol once a week.  No h/o COVID infection.  Completed Pfizer vaccine 05/2019.  GERD - on daily protonix.   ADHD - on vyvanse, xanax, wellbutrin managed through psychiatry Caitlin Burns.   Some R posterior hip pain with intermittent popping. ?hip issue as infant. Some morning stiffness and achiness.   On Yaz followed by GYN Caitlin Burns. Noticing increasing headaches. Wonders if menstrual relation - as migraine starts when she's on placebo pill. GYN is planning to stop placebo pill. Seen at [facility] a month ago with bad headache. Describes unilateral (can alternate) throbbing pain from frontal area down back of head, at its worse has migraine associate with nausea, photo/phonophobia, activity limiting. Recently getting bad migraine once a month. No aura but she notes darkening to left forehead vein day prior to migraine. Triggers are prolonged sitting.  LMP 11/02/2019  Grandmother with h/o brain aneurysm.  Patient had reassuring MRI/MRA head 10/2011.  Excedrin, ibuprofen spikes blood pressure high so she avoids.  Trial off vyvanse didn't help HA or BP.    Previously on antihypertensive, has not needed any longer.  H/o raynaud's.   No recent CPE.  Last well woman 09/2019, normal pap.      Relevant past medical, surgical, family and social history reviewed and updated as indicated. Interim medical history since our last visit reviewed. Allergies and medications reviewed and updated. Outpatient Medications Prior to Visit  Medication Sig Dispense Refill  . ALPRAZolam (XANAX) 0.5 MG tablet Take 1 tablet (0.5 mg total) by mouth 2 (two) times daily as needed for anxiety. 180 tablet 1  . buPROPion (WELLBUTRIN XL) 150 MG 24 hr tablet Take 1 tablet (150 mg total) by mouth daily after breakfast. 90 tablet 1  . drospirenone-ethinyl estradiol (YAZ,GIANVI,LORYNA) 3-0.02 MG tablet   11  . ondansetron (ZOFRAN) 4 MG tablet Take 1 tablet (4 mg total) by mouth every 8 (eight) hours as needed for nausea or vomiting. 10 tablet 0  . pantoprazole (PROTONIX) 40 MG tablet Take 40 mg by mouth daily.    Marland Kitchen VYVANSE 60 MG capsule TAKE 1 CAPSULE (60 MG TOTAL) BY MOUTH DAILY AFTER BREAKFAST. FILL 08/17/19. 30 capsule 0  . albuterol (PROVENTIL HFA;VENTOLIN HFA) 108 (90 Base) MCG/ACT inhaler Inhale 1-2 puffs into the lungs every 6 (six) hours as needed for wheezing or shortness of breath. 1 Inhaler 0  . butalbital-acetaminophen-caffeine (FIORICET) 50-325-40 MG tablet Take 1 tablet by mouth every 6 (six) hours as needed for headache or migraine. 10 tablet 0  .  lisdexamfetamine (VYVANSE) 60 MG capsule Take 1 capsule (60 mg total) by mouth daily after breakfast. 30 capsule 0  . lisdexamfetamine (VYVANSE) 60 MG capsule Take 1 capsule (60 mg total) by mouth daily after breakfast. 30 capsule 0  . oseltamivir (TAMIFLU) 75 MG capsule Take 1 capsule (75 mg total) by mouth 2 (two) times daily. 10 capsule 0   No facility-administered medications prior to visit.     Per HPI unless specifically indicated in ROS section below Review of Systems Objective:  BP 130/86 (BP Location: Left  Arm, Patient Position: Sitting, Cuff Size: Normal)   Pulse 89   Temp 97.7 F (36.5 C) (Temporal)   Ht 5' 5.5" (1.664 m)   Wt 145 lb 9 oz (66 kg)   LMP 11/02/2019   SpO2 99%   BMI 23.85 kg/m   Wt Readings from Last 3 Encounters:  11/06/19 145 lb 9 oz (66 kg)  07/14/18 155 lb (70.3 kg)  11/27/17 140 lb (63.5 kg)      Physical Exam Vitals and nursing note reviewed.  Constitutional:      Appearance: Normal appearance. She is not ill-appearing.  Cardiovascular:     Rate and Rhythm: Normal rate and regular rhythm.     Pulses: Normal pulses.     Heart sounds: Normal heart sounds. No murmur heard.   Pulmonary:     Effort: Pulmonary effort is normal. No respiratory distress.     Breath sounds: Normal breath sounds. No wheezing, rhonchi or rales.  Musculoskeletal:     Right lower leg: No edema.     Left lower leg: No edema.     Comments:  No pain midline spine No paraspinous mm tenderness Neg SLR bilaterally. No pain with int/ext rotation at hip. + FABER. No pain at GTB or sciatic notch bilaterally.  Mild discomfort to palpation at R SIJ  Skin:    General: Skin is warm and dry.     Findings: No rash.  Neurological:     General: No focal deficit present.     Mental Status: She is alert and oriented to person, place, and time.     Comments: CN grossly intact  Psychiatric:        Mood and Affect: Mood normal.        Behavior: Behavior normal.       Results for orders placed or performed during the hospital encounter of 09/28/19  Comprehensive metabolic panel  Result Value Ref Range   Glucose 70 65 - 99 mg/dL   BUN 13 6 - 20 mg/dL   Creatinine, Ser 0.77 0.57 - 1.00 mg/dL   GFR calc non Af Amer 98 >59 mL/min/1.73   GFR calc Af Amer 113 >59 mL/min/1.73   BUN/Creatinine Ratio 17 9 - 23   Sodium 140 134 - 144 mmol/L   Potassium 3.3 (L) 3.5 - 5.2 mmol/L   Chloride 101 96 - 106 mmol/L   CO2 20 20 - 29 mmol/L   Calcium 9.6 8.7 - 10.2 mg/dL   Total Protein 7.8 6.0 - 8.5  g/dL   Albumin 4.3 3.8 - 4.8 g/dL   Globulin, Total 3.5 1.5 - 4.5 g/dL   Albumin/Globulin Ratio 1.2 1.2 - 2.2   Bilirubin Total 0.2 0.0 - 1.2 mg/dL   Alkaline Phosphatase 57 48 - 121 IU/L   AST 10 0 - 40 IU/L   ALT 7 0 - 32 IU/L  CBC  Result Value Ref Range   WBC 8.1 3.4 - 10.8  x10E3/uL   RBC 4.35 3.77 - 5.28 x10E6/uL   Hemoglobin 13.4 11.1 - 15.9 g/dL   Hematocrit 38.2 34.0 - 46.6 %   MCV 88 79 - 97 fL   MCH 30.8 26.6 - 33.0 pg   MCHC 35.1 31 - 35 g/dL   RDW 12.5 11.7 - 15.4 %   Platelets 320 150 - 450 x10E3/uL  DG SI JOINT LESS 3 VIEWS CLINICAL DATA:  Right sacroiliac joint pain.  EXAM: SACROILIAC JOINTS - 1-2 VIEW  COMPARISON:  None.  FINDINGS: The sacroiliac joint spaces are maintained and there is no evidence of arthropathy. No other bone abnormalities are seen.  IMPRESSION: Negative.  Electronically Signed   By: Marijo Conception M.D.   On: 11/06/2019 14:28   Assessment & Plan:  This visit occurred during the SARS-CoV-2 public health emergency.  Safety protocols were in place, including screening questions prior to the visit, additional usage of staff PPE, and extensive cleaning of exam room while observing appropriate contact time as indicated for disinfecting solutions.   Problem List Items Addressed This Visit    Raynaud's syndrome   Motion sickness    Scopolamine patch PRN upcoming travel to [facility].       Migraine without aura    Describes typical migraines without aura. Prolonged sitting is a trigger. Avoid triptans given h/o raynaud's and h/o HTN, as well as excedrin and ibuprofen cause spikes in BP. Recently tried fioricet. Will Rx flexeril 5-66m PRN headache. If ineffective, would consider abortive gepant (nurtec vs ubrelvy).       Relevant Medications   cyclobenzaprine (FLEXERIL) 5 MG tablet   History of essential hypertension    H/o this, currently pre-hypertensive. Continue to monitor. Avoid excedrin, ibuprofen.       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 Also notes morning stiffness. Will check SI joint xrays evaluate for spondyloarthropathy - consider checking HLA-B27.  Has had normal ANA, ESR, RF with prior evaluation (2013, again 2016).       Relevant Medications   cyclobenzaprine (FLEXERIL) 5 MG tablet   Attention deficit hyperactivity disorder, combined type    Followed by psych - continue current regimen of vyvanse, wellbutrin, xanax for h/o GAD.       Asthma    Continue PRN albuterol - refilled today. If worsening, low threshold to start ICS.       Relevant Medications   albuterol (VENTOLIN HFA) 108 (90 Base) MCG/ACT inhaler       Meds ordered this encounter  Medications  . DISCONTD: scopolamine (TRANSDERM-SCOP, 1.5 MG,) 1 MG/3DAYS    Sig: Place 1 patch (1.5 mg total) onto the skin every 3 (three) days.    Dispense:  4 patch    Refill:  0  . scopolamine (TRANSDERM-SCOP, 1.5 MG,) 1 MG/3DAYS    Sig: Place 1 patch (1.5 mg total) onto the skin every 3 (three) days.    Dispense:  4 patch    Refill:  3  . albuterol (VENTOLIN HFA) 108 (90 Base) MCG/ACT inhaler    Sig: Inhale 1-2 puffs into the lungs every 6 (six) hours as needed for wheezing or shortness of breath.    Dispense:  18  g    Refill:  3  . cyclobenzaprine (FLEXERIL) 5 MG tablet    Sig: Take 1-2 tablets (5-10 mg total) by mouth 2 (two) times daily as needed (migraine headache - sedation precautions).    Dispense:  30 tablet    Refill:  1   No orders of the defined types were placed in this encounter.   Patient Instructions  Good to see you today May try flexeril 5-84m as needed for migraines/headaches.  SIJ xray today. We will be in touch with results Albuterol refilled.    Return as needed or at your convenience for fasting labs and physical.    Follow up plan: Return if symptoms worsen or fail to improve.  JRia Bush MD

## 2019-11-06 NOTE — Patient Instructions (Addendum)
Good to see you today May try flexeril 5-10mg  as needed for migraines/headaches.  SIJ xray today. We will be in touch with results Albuterol refilled.  Return as needed or at your convenience for fasting labs and physical.

## 2019-11-08 ENCOUNTER — Encounter: Payer: Self-pay | Admitting: Family Medicine

## 2019-11-08 DIAGNOSIS — T753XXA Motion sickness, initial encounter: Secondary | ICD-10-CM | POA: Insufficient documentation

## 2019-11-08 NOTE — Assessment & Plan Note (Signed)
Scopolamine patch PRN upcoming travel to [facility].

## 2019-11-08 NOTE — Assessment & Plan Note (Addendum)
Describes typical migraines without aura. Prolonged sitting is a trigger. Avoid triptans given h/o raynaud's and h/o HTN, as well as excedrin and ibuprofen cause spikes in BP. Recently tried fioricet. Will Rx flexeril 5-10mg  PRN headache. If ineffective, would consider abortive gepant (nurtec vs ubrelvy).

## 2019-11-08 NOTE — Assessment & Plan Note (Signed)
She regularly takes protonix 40mg .

## 2019-11-08 NOTE — Assessment & Plan Note (Signed)
Followed by psych - continue current regimen of vyvanse, wellbutrin, xanax for h/o GAD.

## 2019-11-08 NOTE — Assessment & Plan Note (Addendum)
Describes posterior R hip pain with intermittent popping but pain actually locates to R SIJ with positive FABER test. Also notes morning stiffness. Will check SI joint xrays evaluate for spondyloarthropathy - consider checking HLA-B27.  Has had normal ANA, ESR, RF with prior evaluation (2013, again 2016).

## 2019-11-08 NOTE — Assessment & Plan Note (Signed)
Continue PRN albuterol - refilled today. If worsening, low threshold to start ICS.

## 2019-11-08 NOTE — Assessment & Plan Note (Signed)
H/o this, currently pre-hypertensive. Continue to monitor. Avoid excedrin, ibuprofen.

## 2019-11-30 ENCOUNTER — Other Ambulatory Visit: Payer: Self-pay | Admitting: Psychiatry

## 2019-11-30 DIAGNOSIS — F902 Attention-deficit hyperactivity disorder, combined type: Secondary | ICD-10-CM

## 2019-11-30 NOTE — Telephone Encounter (Signed)
[place] registry documents last dispensing 10/29/2019 now due Vyvanse 60 mg every morning #30 to [place] medically necessary no contraindication

## 2019-11-30 NOTE — Telephone Encounter (Signed)
Apt 08/18
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
## 2019-12-03 ENCOUNTER — Ambulatory Visit: Payer: 59 | Admitting: Neurology

## 2019-12-03 ENCOUNTER — Other Ambulatory Visit: Payer: Self-pay | Admitting: Neurology

## 2019-12-03 ENCOUNTER — Encounter: Payer: Self-pay | Admitting: Neurology

## 2019-12-03 VITALS — BP 136/90 | HR 108 | Ht 65.5 in | Wt 148.2 lb

## 2019-12-03 DIAGNOSIS — R519 Headache, unspecified: Secondary | ICD-10-CM

## 2019-12-03 DIAGNOSIS — R51 Headache with orthostatic component, not elsewhere classified: Secondary | ICD-10-CM | POA: Diagnosis not present

## 2019-12-03 DIAGNOSIS — G43701 Chronic migraine without aura, not intractable, with status migrainosus: Secondary | ICD-10-CM

## 2019-12-03 DIAGNOSIS — G4453 Primary thunderclap headache: Secondary | ICD-10-CM

## 2019-12-03 DIAGNOSIS — Z8249 Family history of ischemic heart disease and other diseases of the circulatory system: Secondary | ICD-10-CM | POA: Diagnosis not present

## 2019-12-03 DIAGNOSIS — G4484 Primary exertional headache: Secondary | ICD-10-CM

## 2019-12-03 MED ORDER — NURTEC 75 MG PO TBDP: 75.0000 mg | ORAL_TABLET | Freq: Every day | ORAL | 6 refills | Status: DC | PRN

## 2019-12-03 MED ORDER — AJOVY 225 MG/1.5ML ~~LOC~~ SOAJ: 225.0000 mg | SUBCUTANEOUS | 11 refills | Status: DC

## 2019-12-03 MED ORDER — ONDANSETRON 4 MG PO TBDP
4.0000 mg | ORAL_TABLET | Freq: Three times a day (TID) | ORAL | 3 refills | Status: DC | PRN
Start: 2019-12-03 — End: 2020-11-21

## 2019-12-03 NOTE — Patient Instructions (Signed)
Nurtec as needed for migraine Ajovy - started, 3 injections every 3 months MRI brain and blood vessels - will call Keep me updated on how you are doing zofran as needed for nausea or may take with Nurtec  Meds ordered this encounter  Medications  . Rimegepant Sulfate (NURTEC) 75 MG TBDP    Sig: Take 75 mg by mouth daily as needed. For migraines. Take as close to onset of migraine as possible. One daily maximum.    Dispense:  8 tablet    Refill:  6    Dispense 8 or max allowed by insurance. Patient also has a copay card  . ondansetron (ZOFRAN-ODT) 4 MG disintegrating tablet    Sig: Take 1 tablet (4 mg total) by mouth every 8 (eight) hours as needed for nausea.    Dispense:  30 tablet    Refill:  3  . Fremanezumab-vfrm (AJOVY) 225 MG/1.5ML SOAJ    Sig: Inject 225 mg into the skin every 30 (thirty) days.    Dispense:  3 pen    Refill:  11   Rimegepant oral dissolving tablet What is this medicine? RIMEGEPANT (ri ME je pant) is used to treat migraine headaches with or without aura. An aura is a strange feeling or visual disturbance that warns you of an attack. It is not used to prevent migraines. This medicine may be used for other purposes; ask your health care provider or pharmacist if you have questions. COMMON BRAND NAME(S): NURTEC ODT What should I tell my health care provider before I take this medicine? They need to know if you have any of these conditions:  kidney disease  liver disease  an unusual or allergic reaction to rimegepant, other medicines, foods, dyes, or preservatives  pregnant or trying to get pregnant  breast-feeding How should I use this medicine? Take the medicine by mouth. Follow the directions on the prescription label. Leave the tablet in the sealed blister pack until you are ready to take it. With dry hands, open the blister and gently remove the tablet. If the tablet breaks or crumbles, throw it away and take a new tablet out of the blister pack. Place  the tablet in the mouth and allow it to dissolve, and then swallow. Do not cut, crush, or chew this medicine. You do not need water to take this medicine. Talk to your pediatrician about the use of this medicine in children. Special care may be needed. Overdosage: If you think you have taken too much of this medicine contact a poison control center or emergency room at once. NOTE: This medicine is only for you. Do not share this medicine with others. What if I miss a dose? This does not apply. This medicine is not for regular use. What may interact with this medicine? This medicine may interact with the following medications:  certain medicines for fungal infections like fluconazole, itraconazole  rifampin This list may not describe all possible interactions. Give your health care provider a list of all the medicines, herbs, non-prescription drugs, or dietary supplements you use. Also tell them if you smoke, drink alcohol, or use illegal drugs. Some items may interact with your medicine. What should I watch for while using this medicine? Visit your health care professional for regular checks on your progress. Tell your health care professional if your symptoms do not start to get better or if they get worse. What side effects may I notice from receiving this medicine? Side effects that you should report to  your doctor or health care professional as soon as possible:  allergic reactions like skin rash, itching or hives; swelling of the face, lips, or tongue Side effects that usually do not require medical attention (report these to your doctor or health care professional if they continue or are bothersome):  nausea This list may not describe all possible side effects. Call your doctor for medical advice about side effects. You may report side effects to FDA at 1-800-FDA-1088. Where should I keep my medicine? Keep out of the reach of children. Store at room temperature between 15 and 30  degrees C (59 and 86 degrees F). Throw away any unused medicine after the expiration date. NOTE: This sheet is a summary. It may not cover all possible information. If you have questions about this medicine, talk to your doctor, pharmacist, or health care provider.  2020 Elsevier/Gold Standard (2018-07-07 00:21:31) Rolanda Lundborg injection What is this medicine? FREMANEZUMAB (fre ma NEZ ue mab) is used to prevent migraine headaches. This medicine may be used for other purposes; ask your health care provider or pharmacist if you have questions. COMMON BRAND NAME(S): AJOVY What should I tell my health care provider before I take this medicine? They need to know if you have any of these conditions:  an unusual or allergic reaction to fremanezumab, other medicines, foods, dyes, or preservatives  pregnant or trying to get pregnant  breast-feeding How should I use this medicine? This medicine is for injection under the skin. You will be taught how to prepare and give this medicine. Use exactly as directed. Take your medicine at regular intervals. Do not take your medicine more often than directed. It is important that you put your used needles and syringes in a special sharps container. Do not put them in a trash can. If you do not have a sharps container, call your pharmacist or healthcare provider to get one. Talk to your pediatrician regarding the use of this medicine in children. Special care may be needed. Overdosage: If you think you have taken too much of this medicine contact a poison control center or emergency room at once. NOTE: This medicine is only for you. Do not share this medicine with others. What if I miss a dose? If you miss a dose, take it as soon as you can. If it is almost time for your next dose, take only that dose. Do not take double or extra doses. What may interact with this medicine? Interactions are not expected. This list may not describe all possible interactions.  Give your health care provider a list of all the medicines, herbs, non-prescription drugs, or dietary supplements you use. Also tell them if you smoke, drink alcohol, or use illegal drugs. Some items may interact with your medicine. What should I watch for while using this medicine? Tell your doctor or healthcare professional if your symptoms do not start to get better or if they get worse. What side effects may I notice from receiving this medicine? Side effects that you should report to your doctor or health care professional as soon as possible:  allergic reactions like skin rash, itching or hives, swelling of the face, lips, or tongue Side effects that usually do not require medical attention (report these to your doctor or health care professional if they continue or are bothersome):  pain, redness, or irritation at site where injected This list may not describe all possible side effects. Call your doctor for medical advice about side effects. You may report side  effects to FDA at 1-800-FDA-1088. Where should I keep my medicine? Keep out of the reach of children. You will be instructed on how to store this medicine. Throw away any unused medicine after the expiration date on the label. NOTE: This sheet is a summary. It may not cover all possible information. If you have questions about this medicine, talk to your doctor, pharmacist, or health care provider.  2020 Elsevier/Gold Standard (2017-01-21 17:22:56)

## 2019-12-07 ENCOUNTER — Telehealth: Payer: Self-pay

## 2019-12-07 NOTE — Telephone Encounter (Addendum)
PA for nurtec. (Key: [id number])  was submitted to Cover my meds and received instant approval.  The request has been approved. The authorization is effective for a maximum of 6 fills from 12/07/2019 to 06/07/2020, as long as the member is enrolled in their current health plan. This has been approved for a quantity limit of 18.0 with a day supply limit of 30.0. A written notification letter will follow with additional details.

## 2019-12-08 NOTE — Telephone Encounter (Signed)
We received the approval letter from [place]. I faxed this to pt's pharmacy. Received a receipt of confirmation.

## 2019-12-09 ENCOUNTER — Telehealth: Payer: Self-pay | Admitting: Neurology

## 2019-12-09 NOTE — Telephone Encounter (Signed)
spoke to the patient she states she wcb to schedule wants to see his the shots work first  [place]: [employer] Ref # [id number]

## 2019-12-23 ENCOUNTER — Encounter: Payer: Self-pay | Admitting: Psychiatry

## 2019-12-23 ENCOUNTER — Ambulatory Visit (INDEPENDENT_AMBULATORY_CARE_PROVIDER_SITE_OTHER): Payer: 59 | Admitting: Psychiatry

## 2019-12-23 ENCOUNTER — Other Ambulatory Visit: Payer: Self-pay | Admitting: Psychiatry

## 2019-12-23 ENCOUNTER — Other Ambulatory Visit: Payer: Self-pay

## 2019-12-23 VITALS — Ht 65.5 in | Wt 147.0 lb

## 2019-12-23 DIAGNOSIS — F411 Generalized anxiety disorder: Secondary | ICD-10-CM | POA: Diagnosis not present

## 2019-12-23 DIAGNOSIS — F410 Panic disorder [episodic paroxysmal anxiety] without agoraphobia: Secondary | ICD-10-CM | POA: Diagnosis not present

## 2019-12-23 DIAGNOSIS — F902 Attention-deficit hyperactivity disorder, combined type: Secondary | ICD-10-CM

## 2019-12-23 MED ORDER — BUPROPION HCL ER (XL) 150 MG PO TB24: 150.0000 mg | ORAL_TABLET | Freq: Every day | ORAL | 1 refills | Status: DC

## 2019-12-23 MED ORDER — LISDEXAMFETAMINE DIMESYLATE 60 MG PO CAPS: 60.0000 mg | ORAL_CAPSULE | Freq: Every day | ORAL | 0 refills | Status: DC

## 2019-12-23 MED ORDER — ALPRAZOLAM 0.5 MG PO TABS: 0.5000 mg | ORAL_TABLET | Freq: Two times a day (BID) | ORAL | 0 refills | Status: DC | PRN

## 2019-12-23 NOTE — Progress Notes (Signed)
[facility]  Patient ID: Caitlin Burns,  MRN: [id number]  PCP: Ria Bush, MD  Date of Evaluation: 12/23/2019 Time spent:20 minutes from 1655 to 1715  Chief Complaint:  Chief Complaint    ADHD; Anxiety; Panic Attack      HISTORY/CURRENT STATUS: Caitlin Burns is seen onsite in office 20 minutes face-to-face individually with consent with epic collateral for psychiatric interview and exam in 53-month evaluation and management of panic and generalized anxiety and ADHD.  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  She did have a [place] vacation 3 weeks ago with the family though son had motion sickness.  She takes Vyvanse 60 mg daily and her Wellbutrin 150 mg XL every morning.  [place] registry documents last Vyvanse dispensing 12/01/2019 appropriately dispensed monthly.  Last Xanax 0.5 mg twice daily as needed dispensing was #180 tablets June 18, 2019 as 90-day supply thereby lasting more than 6 months.  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  However, she problem solves and relates and functions well resulting in feeling well having no complaints today.  She has no mania, suicidality, psychosis or delirium.  Anxiety Presents forfollow-upvisit. Symptoms include worry,nervous/anxious behavior, somatic fixations, low mental energy, impulse control deficits, insufficient concentration, irritability,andrestlessness. Patient reports nochest pain,no depressed mood,no obsessions, no panic, nofeeling of choking,nohyperventilation, noinsomnia,nosuicidal ideas,nomuscle tension,nopalpitations,or shortness of breath. Symptoms occurmost days. The severity of symptoms ismoderate. The quality of sleep isfair. Nighttime awakenings:occasional. Compliance with medications is76-100%.  Individual Medical History/ Review of Systems: Changes? :Yes with migraine treatment  intensified to AJOVY every 3 months and Nurtec as  needed.  She restarted birth control pill having on/off side effects.  She has overcome fatigue behaviorally reducing weight 23 pounds after having gained 14 pounds last appointment.  Allergies: Advil [ibuprofen], Aspirin, and Sulfonamide derivatives  Current Medications:  Current Outpatient Medications:  .  albuterol (VENTOLIN HFA) 108 (90 Base) MCG/ACT inhaler, Inhale 1-2 puffs into the lungs every 6 (six) hours as needed for wheezing or shortness of breath., Disp: 18 g, Rfl: 3 .  ALPRAZolam (XANAX) 0.5 MG tablet, Take 1 tablet (0.5 mg total) by mouth 2 (two) times daily as needed for anxiety., Disp: 180 tablet, Rfl: 0 .  buPROPion (WELLBUTRIN XL) 150 MG 24 hr tablet, Take 1 tablet (150 mg total) by mouth daily after breakfast., Disp: 90 tablet, Rfl: 1 .  cyclobenzaprine (FLEXERIL) 5 MG tablet, Take 1-2 tablets (5-10 mg total) by mouth 2 (two) times daily as needed (migraine headache - sedation precautions)., Disp: 30 tablet, Rfl: 1 .  drospirenone-ethinyl estradiol (YAZ,GIANVI,LORYNA) 3-0.02 MG tablet, , Disp: , Rfl: 11 .  Fremanezumab-vfrm (AJOVY) 225 MG/1.5ML SOAJ, Inject 225 mg into the skin every 30 (thirty) days., Disp: 3 pen, Rfl: 11 .  [START ON 12/31/2019] lisdexamfetamine (VYVANSE) 60 MG capsule, Take 1 capsule (60 mg total) by mouth daily after breakfast., Disp: 30 capsule, Rfl: 0 .  [START ON 01/30/2020] lisdexamfetamine (VYVANSE) 60 MG capsule, Take 1 capsule (60 mg total) by mouth daily after breakfast., Disp: 30 capsule, Rfl: 0 .  [START ON 02/29/2020] lisdexamfetamine (VYVANSE) 60 MG capsule, Take 1 capsule (60 mg total) by mouth daily after breakfast., Disp: 30 capsule, Rfl: 0 .  ondansetron (ZOFRAN) 4 MG tablet, Take 1 tablet (4 mg total) by mouth every 8 (eight) hours as needed for nausea or vomiting., Disp: 10 tablet, Rfl: 0 .  ondansetron (ZOFRAN-ODT) 4 MG disintegrating tablet, Take 1 tablet (4 mg total) by mouth every 8 (  eight) hours as needed for nausea., Disp: 30 tablet, Rfl:  3 .  pantoprazole (PROTONIX) 40 MG tablet, Take 40 mg by mouth daily., Disp: , Rfl:  .  Rimegepant Sulfate (NURTEC) 75 MG TBDP, Take 75 mg by mouth daily as needed. For migraines. Take as close to onset of migraine as possible. One daily maximum., Disp: 8 tablet, Rfl: 6 .  scopolamine (TRANSDERM-SCOP, 1.5 MG,) 1 MG/3DAYS, Place 1 patch (1.5 mg total) onto the skin every 3 (three) days., Disp: 4 patch, Rfl: 3  Medication Side Effects: none  Family Medical/ Social History: Changes? No having work family distress particularly relative to stay at home management of covid out of her control  MENTAL HEALTH EXAM:  Height 5' 5.5" (1.664 m), weight 147 lb (66.7 kg).Body mass index is 24.09 kg/m. Muscle strengths and tone 5/5, postural reflexes and gait 0/0, and AIMS = 0.  General Appearance: Casual, Meticulous and Well Groomed  Eye Contact:  Good  Speech:  Clear and Coherent, Normal Rate and Talkative  Volume:  Normal  Mood:  Anxious and Euthymic  Affect:  Congruent, Labile and Anxious  Thought Process:  Coherent, Goal Directed, Linear and Descriptions of Associations: Tangential  Orientation:  Full (Time, Place, and Person)  Thought Content: Rumination and Tangential   Suicidal Thoughts:  No  Homicidal Thoughts:  No  Memory:  Immediate;   Good Remote;   Good  Judgement:  Good  Insight:  Fair  Psychomotor Activity:  Normal, Increased and Mannerisms  Concentration:  Concentration: Fair and Attention Span: Fair  Recall:  [employer] of Knowledge: Good  Language: Good  Assets:  Desire for Improvement Intimacy Leisure Time Talents/Skills Vocational/Educational  ADL's:  Intact  Cognition: WNL  Prognosis:  Good    DIAGNOSES:    ICD-10-CM   1. Attention deficit hyperactivity disorder, combined type  F90.2 lisdexamfetamine (VYVANSE) 60 MG capsule    lisdexamfetamine (VYVANSE) 60 MG capsule    lisdexamfetamine (VYVANSE) 60 MG capsule    buPROPion (WELLBUTRIN XL) 150 MG 24 hr tablet  2.  Generalized anxiety disorder  F41.1 ALPRAZolam (XANAX) 0.5 MG tablet    buPROPion (WELLBUTRIN XL) 150 MG 24 hr tablet  3. Panic disorder  F41.0 ALPRAZolam (XANAX) 0.5 MG tablet    Receiving Psychotherapy: No    RECOMMENDATIONS: Psychosupportive psychoeducation integrates cognitive behavioral nutrition, sleep hygiene, social skill problem-solving, and frustration management with symptom treatment matching for prevention and monitoring safety hygiene.  Vyvanse is continued 60 mg every morning after breakfast sent by eScription as #30 each for August 26, September 25, and October 25 to [facility] employee pharmacy.for ADHD.  Wellbutrin is E scribed 150 mg XL every morning after breakfast sent as #90 with 1 refill to [facility] for generalized anxiety and ADHD.  She is E scribed Xanax 0.5 mg twice daily as needed for panic sent as #180 with no refill to [facility] employee pharmacy.  Follow-up is planned in 6 months or sooner if needed.   Delight Hoh, MD

## 2020-01-05 ENCOUNTER — Encounter: Payer: Self-pay | Admitting: Family Medicine

## 2020-01-05 ENCOUNTER — Ambulatory Visit (INDEPENDENT_AMBULATORY_CARE_PROVIDER_SITE_OTHER): Payer: 59 | Admitting: Family Medicine

## 2020-01-05 ENCOUNTER — Other Ambulatory Visit: Payer: Self-pay

## 2020-01-05 VITALS — BP 148/104 | HR 87 | Temp 98.0°F | Ht 65.5 in | Wt 146.5 lb

## 2020-01-05 DIAGNOSIS — R42 Dizziness and giddiness: Secondary | ICD-10-CM

## 2020-01-05 DIAGNOSIS — I10 Essential (primary) hypertension: Secondary | ICD-10-CM

## 2020-01-05 DIAGNOSIS — I7300 Raynaud's syndrome without gangrene: Secondary | ICD-10-CM | POA: Diagnosis not present

## 2020-01-05 DIAGNOSIS — Z13220 Encounter for screening for lipoid disorders: Secondary | ICD-10-CM

## 2020-01-05 DIAGNOSIS — Z0000 Encounter for general adult medical examination without abnormal findings: Secondary | ICD-10-CM | POA: Diagnosis not present

## 2020-01-05 DIAGNOSIS — G43701 Chronic migraine without aura, not intractable, with status migrainosus: Secondary | ICD-10-CM | POA: Diagnosis not present

## 2020-01-05 MED ORDER — LISINOPRIL 5 MG PO TABS: 5.0000 mg | ORAL_TABLET | Freq: Every day | ORAL | 6 refills | Status: DC

## 2020-01-05 NOTE — Assessment & Plan Note (Signed)
Preventative protocols reviewed and updated unless pt declined. Discussed healthy diet and lifestyle.  

## 2020-01-05 NOTE — Patient Instructions (Addendum)
Work on fruits/vegetables in the diet.  Start lisinopril 5mg  daily. Return in 1 week for labs.  Your goal blood pressure is <140/90, ideally <130/80.  Work on low salt/sodium diet - goal <2gm (2,000mg ) per day. Eat a diet high in fruits/vegetables and whole grains.  Look into mediterranean and DASH diet.  Goal activity is 13min/wk of moderate intensity exercise.  This can be split into 30 minute chunks.  If you are not at this level, you can start with smaller 10-15 min increments and slowly build up activity. Look at [email or web address].org for more resources  Return in 1-2 months for blood pressure follow up.   Health Maintenance, Female Adopting a healthy lifestyle and getting preventive care are important in promoting health and wellness. Ask your health care provider about:  The right schedule for you to have regular tests and exams.  Things you can do on your own to prevent diseases and keep yourself healthy. What should I know about diet, weight, and exercise? Eat a healthy diet   Eat a diet that includes plenty of vegetables, fruits, low-fat dairy products, and lean protein.  Do not eat a lot of foods that are high in solid fats, added sugars, or sodium. Maintain a healthy weight Body mass index (BMI) is used to identify weight problems. It estimates body fat based on height and weight. Your health care provider can help determine your BMI and help you achieve or maintain a healthy weight. Get regular exercise Get regular exercise. This is one of the most important things you can do for your health. Most adults should:  Exercise for at least 150 minutes each week. The exercise should increase your heart rate and make you sweat (moderate-intensity exercise).  Do strengthening exercises at least twice a week. This is in addition to the moderate-intensity exercise.  Spend less time sitting. Even light physical activity can be beneficial. Watch cholesterol and blood lipids Have your blood  tested for lipids and cholesterol at 39 years of age, then have this test every 5 years. Have your cholesterol levels checked more often if:  Your lipid or cholesterol levels are high.  You are older than 39 years of age.  You are at high risk for heart disease. What should I know about cancer screening? Depending on your health history and family history, you may need to have cancer screening at various ages. This may include screening for:  Breast cancer.  Cervical cancer.  Colorectal cancer.  Skin cancer.  Lung cancer. What should I know about heart disease, diabetes, and high blood pressure? Blood pressure and heart disease  High blood pressure causes heart disease and increases the risk of stroke. This is more likely to develop in people who have high blood pressure readings, are of African descent, or are overweight.  Have your blood pressure checked: ? Every 3-5 years if you are 25-40 years of age. ? Every year if you are 36 years old or older. Diabetes Have regular diabetes screenings. This checks your fasting blood sugar level. Have the screening done:  Once every three years after age 34 if you are at a normal weight and have a low risk for diabetes.  More often and at a younger age if you are overweight or have a high risk for diabetes. What should I know about preventing infection? Hepatitis B If you have a higher risk for hepatitis B, you should be screened for this virus. Talk with your health care provider to find out  if you are at risk for hepatitis B infection. Hepatitis C Testing is recommended for:  Everyone born from [birth year] through 1965.  Anyone with known risk factors for hepatitis C. Sexually transmitted infections (STIs)  Get screened for STIs, including gonorrhea and chlamydia, if: ? You are sexually active and are younger than 39 years of age. ? You are older than 39 years of age and your health care provider tells you that you are at risk for  this type of infection. ? Your sexual activity has changed since you were last screened, and you are at increased risk for chlamydia or gonorrhea. Ask your health care provider if you are at risk.  Ask your health care provider about whether you are at high risk for HIV. Your health care provider may recommend a prescription medicine to help prevent HIV infection. If you choose to take medicine to prevent HIV, you should first get tested for HIV. You should then be tested every 3 months for as long as you are taking the medicine. Pregnancy  If you are about to stop having your period (premenopausal) and you may become pregnant, seek counseling before you get pregnant.  Take 400 to 800 micrograms (mcg) of folic acid every day if you become pregnant.  Ask for birth control (contraception) if you want to prevent pregnancy. Osteoporosis and menopause Osteoporosis is a disease in which the bones lose minerals and strength with aging. This can result in bone fractures. If you are 37 years old or older, or if you are at risk for osteoporosis and fractures, ask your health care provider if you should:  Be screened for bone loss.  Take a calcium or vitamin D supplement to lower your risk of fractures.  Be given hormone replacement therapy (HRT) to treat symptoms of menopause. Follow these instructions at home: Lifestyle  Do not use any products that contain nicotine or tobacco, such as cigarettes, e-cigarettes, and chewing tobacco. If you need help quitting, ask your health care provider.  Do not use street drugs.  Do not share needles.  Ask your health care provider for help if you need support or information about quitting drugs. Alcohol use  Do not drink alcohol if: ? Your health care provider tells you not to drink. ? You are pregnant, may be pregnant, or are planning to become pregnant.  If you drink alcohol: ? Limit how much you use to 0-1 drink a day. ? Limit intake if you are  breastfeeding.  Be aware of how much alcohol is in your drink. In the [place] one drink equals one 12 oz bottle of beer (355 mL), one 5 oz glass of wine (148 mL), or one 1 oz glass of hard liquor (44 mL). General instructions  Schedule regular health, dental, and eye exams.  Stay current with your vaccines.  Tell your health care provider if: ? You often feel depressed. ? You have ever been abused or do not feel safe at home. Summary  Adopting a healthy lifestyle and getting preventive care are important in promoting health and wellness.  Follow your health care provider's instructions about healthy diet, exercising, and getting tested or screened for diseases.  Follow your health care provider's instructions on monitoring your cholesterol and blood pressure. This information is not intended to replace advice given to you by your health care provider. Make sure you discuss any questions you have with your health care provider. Document Revised: 04/16/2018 Document Reviewed: 04/16/2018 Elsevier Patient Education  [phone]  [employer].

## 2020-01-05 NOTE — Progress Notes (Signed)
This visit was conducted in person.  BP (!) 148/104 (BP Location: Right Arm, Cuff Size: Normal)   Pulse 87   Temp 98 F (36.7 C) (Temporal)   Ht 5' 5.5" (1.664 m)   Wt 146 lb 8 oz (66.5 kg)   LMP  (Within Months)   SpO2 97%   BMI 24.01 kg/m    CC: CPE Subjective:    Patient ID: Caitlin Burns, female    DOB: 06/08/1980, 39 y.o.   MRN: [id number]  HPI: Caitlin Burns is a 39 y.o. female presenting on 01/05/2020 for Annual Exam   ADHD - on vyvanse, xanax, wellbutrin managed through psychiatry Dr Creig Hines.  Migraine - last visit we started flexeril PRN typical migraine. Avoid triptan NSAID and excedrin with raynaud and HTN hx. Saw neurology Dr [name], started on Ajovy injection, as well as nurtec PRN.   Fasting today for labs.   Ongoing hip popping. Has had PT in the past.  Intermittent dizziness ongoing. No tinnitus or hearing changes. Motion sickness seems to be worsening.   Persistently elevated readings   Preventative: Well woman through GYN 09/2019 with normal pap  No fmhx breast cancer.  Flu shot at work [employer] 03/2019, 05/2019 Tdap - 05/2012  Seat belt use discussed Sunscreen use discussed. No changing moles on skin.  Non smoker  Alcohol - rare Dentist q6 mo Eye exam several years ago - s/p lasik.   Married in 2003- divorced '06. Married '11 .1 daughter adopted [place] 2006.  Lives with husband Merrilee Seashore, daughter [name] and son [name] [employer]: [employer] Occ: Radiation therapist at [employer]: good water, fruits/vegetables daily  Activity: walking outside during lunch  Caffeine- sodas 2 daily     Relevant past medical, surgical, family and social history reviewed and updated as indicated. Interim medical history since our last visit reviewed. Allergies and medications reviewed and updated. Outpatient Medications Prior to Visit  Medication Sig Dispense Refill  . albuterol (VENTOLIN HFA) 108 (90 Base) MCG/ACT inhaler Inhale 1-2 puffs into  the lungs every 6 (six) hours as needed for wheezing or shortness of breath. 18 g 3  . ALPRAZolam (XANAX) 0.5 MG tablet Take 1 tablet (0.5 mg total) by mouth 2 (two) times daily as needed for anxiety. 180 tablet 0  . buPROPion (WELLBUTRIN XL) 150 MG 24 hr tablet Take 1 tablet (150 mg total) by mouth daily after breakfast. 90 tablet 1  . cyclobenzaprine (FLEXERIL) 5 MG tablet Take 1-2 tablets (5-10 mg total) by mouth 2 (two) times daily as needed (migraine headache - sedation precautions). 30 tablet 1  . drospirenone-ethinyl estradiol (YAZ,GIANVI,LORYNA) 3-0.02 MG tablet   11  . Fremanezumab-vfrm (AJOVY) 225 MG/1.5ML SOAJ Inject 225 mg into the skin every 30 (thirty) days. 3 pen 11  . lisdexamfetamine (VYVANSE) 60 MG capsule Take 1 capsule (60 mg total) by mouth daily after breakfast. 30 capsule 0  . ondansetron (ZOFRAN) 4 MG tablet Take 1 tablet (4 mg total) by mouth every 8 (eight) hours as needed for nausea or vomiting. 10 tablet 0  . ondansetron (ZOFRAN-ODT) 4 MG disintegrating tablet Take 1 tablet (4 mg total) by mouth every 8 (eight) hours as needed for nausea. 30 tablet 3  . pantoprazole (PROTONIX) 40 MG tablet Take 40 mg by mouth daily.    . Rimegepant Sulfate (NURTEC) 75 MG TBDP Take 75 mg by mouth daily as needed. For migraines. Take as close to onset of migraine as possible. One daily  maximum. 8 tablet 6  . scopolamine (TRANSDERM-SCOP, 1.5 MG,) 1 MG/3DAYS Place 1 patch (1.5 mg total) onto the skin every 3 (three) days. 4 patch 3  . [START ON 01/30/2020] lisdexamfetamine (VYVANSE) 60 MG capsule Take 1 capsule (60 mg total) by mouth daily after breakfast. 30 capsule 0  . [START ON 02/29/2020] lisdexamfetamine (VYVANSE) 60 MG capsule Take 1 capsule (60 mg total) by mouth daily after breakfast. 30 capsule 0   No facility-administered medications prior to visit.     Per HPI unless specifically indicated in ROS section below Review of Systems  Constitutional: Negative for activity change,  appetite change, chills, fatigue, fever and unexpected weight change.  HENT: Negative for hearing loss.   Eyes: Negative for visual disturbance.  Respiratory: Negative for cough, chest tightness, shortness of breath and wheezing.   Cardiovascular: Negative for chest pain, palpitations and leg swelling.  Gastrointestinal: Negative for abdominal distention, abdominal pain, blood in stool, constipation, diarrhea, nausea and vomiting.  Genitourinary: Negative for difficulty urinating and hematuria.  Musculoskeletal: Negative for arthralgias, myalgias and neck pain.  Skin: Negative for rash.  Neurological: Positive for dizziness (occasional vertigo). Negative for seizures, syncope and headaches.  Hematological: Negative for adenopathy. Does not bruise/bleed easily.  Psychiatric/Behavioral: Negative for dysphoric mood. The patient is not nervous/anxious.    Objective:  BP (!) 148/104 (BP Location: Right Arm, Cuff Size: Normal)   Pulse 87   Temp 98 F (36.7 C) (Temporal)   Ht 5' 5.5" (1.664 m)   Wt 146 lb 8 oz (66.5 kg)   LMP  (Within Months)   SpO2 97%   BMI 24.01 kg/m   Wt Readings from Last 3 Encounters:  01/05/20 146 lb 8 oz (66.5 kg)  12/03/19 148 lb 3.2 oz (67.2 kg)  11/06/19 145 lb 9 oz (66 kg)      Physical Exam Vitals and nursing note reviewed.  Constitutional:      General: She is not in acute distress.    Appearance: Normal appearance. She is well-developed. She is not ill-appearing.  HENT:     Head: Normocephalic and atraumatic.     Right Ear: Hearing, tympanic membrane, ear canal and external ear normal.     Left Ear: Hearing, tympanic membrane, ear canal and external ear normal.     Mouth/Throat:     Pharynx: Uvula midline.  Eyes:     General: No scleral icterus.    Extraocular Movements: Extraocular movements intact.     Conjunctiva/sclera: Conjunctivae normal.     Pupils: Pupils are equal, round, and reactive to light.  Neck:     Vascular: No carotid bruit.    Cardiovascular:     Rate and Rhythm: Normal rate and regular rhythm.     Pulses: Normal pulses.          Radial pulses are 2+ on the right side and 2+ on the left side.     Heart sounds: Normal heart sounds. No murmur heard.   Pulmonary:     Effort: Pulmonary effort is normal. No respiratory distress.     Breath sounds: Normal breath sounds. No wheezing, rhonchi or rales.  Abdominal:     General: Abdomen is flat. Bowel sounds are normal. There is no distension.     Palpations: Abdomen is soft. There is no mass.     Tenderness: There is no abdominal tenderness. There is no guarding or rebound.     Hernia: No hernia is present.  Musculoskeletal:  General: Normal range of motion.     Cervical back: Normal range of motion and neck supple.     Right lower leg: No edema.  Lymphadenopathy:     Cervical: No cervical adenopathy.  Skin:    General: Skin is warm and dry.     Findings: No rash.  Neurological:     General: No focal deficit present.     Mental Status: She is alert and oriented to person, place, and time.     Comments: CN grossly intact, station and gait intact  Psychiatric:        Mood and Affect: Mood normal.        Behavior: Behavior normal.        Thought Content: Thought content normal.        Judgment: Judgment normal.       Results for orders placed or performed during the hospital encounter of 09/28/19  Comprehensive metabolic panel  Result Value Ref Range   Glucose 70 65 - 99 mg/dL   BUN 13 6 - 20 mg/dL   Creatinine, Ser 0.77 0.57 - 1.00 mg/dL   GFR calc non Af Amer 98 >59 mL/min/1.73   GFR calc Af Amer 113 >59 mL/min/1.73   BUN/Creatinine Ratio 17 9 - 23   Sodium 140 134 - 144 mmol/L   Potassium 3.3 (L) 3.5 - 5.2 mmol/L   Chloride 101 96 - 106 mmol/L   CO2 20 20 - 29 mmol/L   Calcium 9.6 8.7 - 10.2 mg/dL   Total Protein 7.8 6.0 - 8.5 g/dL   Albumin 4.3 3.8 - 4.8 g/dL   Globulin, Total 3.5 1.5 - 4.5 g/dL   Albumin/Globulin Ratio 1.2 1.2 - 2.2    Bilirubin Total 0.2 0.0 - 1.2 mg/dL   Alkaline Phosphatase 57 48 - 121 IU/L   AST 10 0 - 40 IU/L   ALT 7 0 - 32 IU/L  CBC  Result Value Ref Range   WBC 8.1 3.4 - 10.8 x10E3/uL   RBC 4.35 3.77 - 5.28 x10E6/uL   Hemoglobin 13.4 11.1 - 15.9 g/dL   Hematocrit 38.2 34.0 - 46.6 %   MCV 88 79 - 97 fL   MCH 30.8 26.6 - 33.0 pg   MCHC 35.1 31 - 35 g/dL   RDW 12.5 11.7 - 15.4 %   Platelets 320 150 - 450 x10E3/uL   Assessment & Plan:  This visit occurred during the SARS-CoV-2 public health emergency.  Safety protocols were in place, including screening questions prior to the visit, additional usage of staff PPE, and extensive cleaning of exam room while observing appropriate contact time as indicated for disinfecting solutions.   Problem List Items Addressed This Visit    Raynaud's syndrome   Relevant Medications   lisinopril (ZESTRIL) 5 MG tablet   Health maintenance examination - Primary    Preventative protocols reviewed and updated unless pt declined. Discussed healthy diet and lifestyle.       Essential hypertension    Elevated readings today. Endorses very sensitive to BP medications. States home diastolics run 16X. Start low dose lisinopril 5mg  daily. Discussed watching for dry cough and angioedema. RTC 1 wk labs (Cr/K). Encouraged increased potassium rich foods, limit salt in diet. RTC 6-8 wks f/u HTN. Update sooner if any concerns.       Relevant Medications   lisinopril (ZESTRIL) 5 MG tablet   Episodic lightheadedness   Relevant Orders   Basic metabolic panel   TSH   Chronic  migraine without aura with status migrainosus, not intractable    Saw neurology. Now on ajovy preventative with nurtec PRN.       Relevant Medications   lisinopril (ZESTRIL) 5 MG tablet    Other Visit Diagnoses    Lipid screening       Relevant Orders   Lipid panel       Meds ordered this encounter  Medications  . lisinopril (ZESTRIL) 5 MG tablet    Sig: Take 1 tablet (5 mg total) by mouth  daily.    Dispense:  30 tablet    Refill:  6   Orders Placed This Encounter  Procedures  . Basic metabolic panel    Standing Status:   Future    Standing Expiration Date:   01/04/2021  . TSH    Standing Status:   Future    Standing Expiration Date:   01/04/2021  . Lipid panel    Standing Status:   Future    Standing Expiration Date:   01/04/2021    Patient instructions: Work on fruits/vegetables in the diet.  Start lisinopril 5mg  daily. Return in 1 week for labs.  Your goal blood pressure is <140/90, ideally <130/80.  Work on low salt/sodium diet - goal <2gm (2,000mg ) per day. Eat a diet high in fruits/vegetables and whole grains.  Look into mediterranean and DASH diet.  Goal activity is 134min/wk of moderate intensity exercise.  This can be split into 30 minute chunks.  If you are not at this level, you can start with smaller 10-15 min increments and slowly build up activity. Look at [email or web address] for more resources  Return in 1-2 months for blood pressure follow up.   Follow up plan: Return in about 4 weeks (around 02/02/2020), or if symptoms worsen or fail to improve, for follow up visit.  Ria Bush, MD

## 2020-01-05 NOTE — Assessment & Plan Note (Signed)
Saw neurology. Now on ajovy preventative with nurtec PRN.

## 2020-01-05 NOTE — Assessment & Plan Note (Signed)
Elevated readings today. Endorses very sensitive to BP medications. States home diastolics run 50H. Start low dose lisinopril 5mg  daily. Discussed watching for dry cough and angioedema. RTC 1 wk labs (Cr/K). Encouraged increased potassium rich foods, limit salt in diet. RTC 6-8 wks f/u HTN. Update sooner if any concerns.

## 2020-01-14 ENCOUNTER — Encounter: Payer: Self-pay | Admitting: Family Medicine

## 2020-01-14 DIAGNOSIS — I7300 Raynaud's syndrome without gangrene: Secondary | ICD-10-CM

## 2020-01-14 DIAGNOSIS — M2550 Pain in unspecified joint: Secondary | ICD-10-CM

## 2020-01-14 DIAGNOSIS — M2560 Stiffness of unspecified joint, not elsewhere classified: Secondary | ICD-10-CM

## 2020-01-14 DIAGNOSIS — G8929 Other chronic pain: Secondary | ICD-10-CM

## 2020-01-15 ENCOUNTER — Other Ambulatory Visit: Payer: Self-pay

## 2020-01-15 ENCOUNTER — Other Ambulatory Visit (INDEPENDENT_AMBULATORY_CARE_PROVIDER_SITE_OTHER): Payer: 59

## 2020-01-15 DIAGNOSIS — R42 Dizziness and giddiness: Secondary | ICD-10-CM

## 2020-01-15 DIAGNOSIS — Z13220 Encounter for screening for lipoid disorders: Secondary | ICD-10-CM

## 2020-01-15 LAB — BASIC METABOLIC PANEL
BUN: 13 mg/dL (ref 6–23)
CO2: 28 mEq/L (ref 19–32)
Calcium: 9.2 mg/dL (ref 8.4–10.5)
Chloride: 99 mEq/L (ref 96–112)
Creatinine, Ser: 0.89 mg/dL (ref 0.40–1.20)
GFR: 70.63 mL/min (ref >60.00)
Glucose, Bld: 100 mg/dL — ABNORMAL HIGH (ref 70–99)
Potassium: 3.8 mEq/L (ref 3.5–5.1)
Sodium: 135 mEq/L (ref 135–145)

## 2020-01-15 LAB — LIPID PANEL
Cholesterol: 187 mg/dL (ref 0–200)
HDL: 53.9 mg/dL (ref >39.00)
LDL Cholesterol: 102 mg/dL — ABNORMAL HIGH (ref 0–99)
NonHDL: 133.01
Total CHOL/HDL Ratio: 3
Triglycerides: 155 mg/dL — ABNORMAL HIGH (ref 0.0–149.0)
VLDL: 31 mg/dL (ref 0.0–40.0)

## 2020-01-15 LAB — TSH: TSH: 3.3 u[IU]/mL (ref 0.35–4.50)

## 2020-01-16 ENCOUNTER — Other Ambulatory Visit: Payer: Self-pay | Admitting: Family Medicine

## 2020-01-16 MED ORDER — AMLODIPINE BESYLATE 2.5 MG PO TABS
2.5000 mg | ORAL_TABLET | Freq: Every day | ORAL | 6 refills | Status: DC
Start: 2020-01-16 — End: 2020-02-08

## 2020-01-28 ENCOUNTER — Other Ambulatory Visit: Payer: Self-pay | Admitting: Obstetrics and Gynecology

## 2020-02-01 ENCOUNTER — Other Ambulatory Visit: Payer: Self-pay | Admitting: Psychiatry

## 2020-02-01 DIAGNOSIS — F410 Panic disorder [episodic paroxysmal anxiety] without agoraphobia: Secondary | ICD-10-CM

## 2020-02-01 DIAGNOSIS — F411 Generalized anxiety disorder: Secondary | ICD-10-CM

## 2020-02-01 NOTE — Telephone Encounter (Signed)
Next apt 06/22/2020

## 2020-02-01 NOTE — Telephone Encounter (Signed)
[facility] employee pharmacy request for Xanax documented as received there according to epic on 12/23/2019 now 6 weeks later the 12-week supply is not documented in [place] registry bringing all such to the pharmacy attention as the supply for patient is sent again

## 2020-02-02 ENCOUNTER — Telehealth: Payer: Self-pay | Admitting: Psychiatry

## 2020-02-02 NOTE — Telephone Encounter (Signed)
Pam @ pharmacy call to advise Rx sent over for Alprazolam @ bottom of Rx stated 180 on 8/18 never received. Rx for 8/18 was on file and was same information as Rx sent 9/27. Pam filled 8/18 Rx. Should she put Rx sent 9/27 on file or delete it. Please advise Pam @ [phone]

## 2020-02-02 NOTE — Telephone Encounter (Signed)
Please review

## 2020-02-02 NOTE — Telephone Encounter (Signed)
Phone review with Caitlin Burns at [facility] that they did have the 12/23/2019 eScription for Xanax No. 180 in their system and have now dispensed to her.  We will leave the new E scription in their system from yesterday though realizing as refill though the expiration date of both escriptions will be approximately within the same 6 months all potential questions are now resolved

## 2020-02-08 ENCOUNTER — Other Ambulatory Visit: Payer: Self-pay

## 2020-02-08 ENCOUNTER — Ambulatory Visit (INDEPENDENT_AMBULATORY_CARE_PROVIDER_SITE_OTHER): Payer: 59 | Admitting: Family Medicine

## 2020-02-08 ENCOUNTER — Encounter: Payer: Self-pay | Admitting: Family Medicine

## 2020-02-08 VITALS — BP 122/84 | HR 92 | Temp 97.9°F | Ht 65.5 in | Wt 146.2 lb

## 2020-02-08 DIAGNOSIS — I7300 Raynaud's syndrome without gangrene: Secondary | ICD-10-CM

## 2020-02-08 DIAGNOSIS — I10 Essential (primary) hypertension: Secondary | ICD-10-CM | POA: Diagnosis not present

## 2020-02-08 MED ORDER — AMLODIPINE BESYLATE 2.5 MG PO TABS: 2.5000 mg | ORAL_TABLET | Freq: Every day | ORAL | 3 refills | Status: DC

## 2020-02-08 NOTE — Patient Instructions (Addendum)
Blood pressures are much improved on low dose amlodipine - continue this. Happy birthday! Return as needed or for next physical after 01/04/2021

## 2020-02-08 NOTE — Assessment & Plan Note (Signed)
Great control on low dose amlodipine - continue. Encouraged good water intake,limiting salt, increasing aerobic exercise.

## 2020-02-08 NOTE — Progress Notes (Signed)
This visit was conducted in person.  BP 122/84 (BP Location: Left Arm, Patient Position: Sitting, Cuff Size: Normal)   Pulse 92   Temp 97.9 F (36.6 C) (Temporal)   Ht 5' 5.5" (1.664 m)   Wt 146 lb 4 oz (66.3 kg)   SpO2 99%   BMI 23.97 kg/m   BP Readings from Last 3 Encounters:  02/08/20 122/84  01/05/20 (!) 148/104  12/03/19 (!) 136/90    CC: HTN f/u visit  Subjective:    Patient ID: Caitlin Burns, female    DOB: 12/24/1980, 39 y.o.   MRN: [id number]  HPI: Caitlin Burns is a 39 y.o. female presenting on 02/08/2020 for Hypertension (Here for 1-2 mo f/u.)   See prior note for details.   HTN - Compliant with current antihypertensive regimen of amlodipine 2.5mg  daily. Does check blood pressures at home: well controlled. No low blood pressure readings or symptoms of dizziness/syncope. Denies HA, vision changes, CP/tightness, SOB, leg swelling. Limiting salt, drinking plenty of water - could do better with aerobic exercise.   Raynaud's - amlodipine also chosen for h/o this.      Relevant past medical, surgical, family and social history reviewed and updated as indicated. Interim medical history since our last visit reviewed. Allergies and medications reviewed and updated. Outpatient Medications Prior to Visit  Medication Sig Dispense Refill  . albuterol (VENTOLIN HFA) 108 (90 Base) MCG/ACT inhaler Inhale 1-2 puffs into the lungs every 6 (six) hours as needed for wheezing or shortness of breath. 18 g 3  . ALPRAZolam (XANAX) 0.5 MG tablet TAKE 1 TABLET (0.5 MG TOTAL) BY MOUTH 2 (TWO) TIMES DAILY AS NEEDED FOR ANXIETY. 180 tablet 0  . buPROPion (WELLBUTRIN XL) 150 MG 24 hr tablet Take 1 tablet (150 mg total) by mouth daily after breakfast. 90 tablet 1  . cyclobenzaprine (FLEXERIL) 5 MG tablet Take 1-2 tablets (5-10 mg total) by mouth 2 (two) times daily as needed (migraine headache - sedation precautions). 30 tablet 1  . drospirenone-ethinyl estradiol (YAZ,GIANVI,LORYNA) 3-0.02  MG tablet   11  . Fremanezumab-vfrm (AJOVY) 225 MG/1.5ML SOAJ Inject 225 mg into the skin every 30 (thirty) days. 3 pen 11  . ondansetron (ZOFRAN) 4 MG tablet Take 1 tablet (4 mg total) by mouth every 8 (eight) hours as needed for nausea or vomiting. 10 tablet 0  . ondansetron (ZOFRAN-ODT) 4 MG disintegrating tablet Take 1 tablet (4 mg total) by mouth every 8 (eight) hours as needed for nausea. 30 tablet 3  . pantoprazole (PROTONIX) 40 MG tablet Take 40 mg by mouth daily.    . Rimegepant Sulfate (NURTEC) 75 MG TBDP Take 75 mg by mouth daily as needed. For migraines. Take as close to onset of migraine as possible. One daily maximum. 8 tablet 6  . scopolamine (TRANSDERM-SCOP, 1.5 MG,) 1 MG/3DAYS Place 1 patch (1.5 mg total) onto the skin every 3 (three) days. 4 patch 3  . amLODipine (NORVASC) 2.5 MG tablet Take 1 tablet (2.5 mg total) by mouth daily. 30 tablet 6  . lisdexamfetamine (VYVANSE) 60 MG capsule Take 1 capsule (60 mg total) by mouth daily after breakfast. 30 capsule 0  . lisdexamfetamine (VYVANSE) 60 MG capsule Take 1 capsule (60 mg total) by mouth daily after breakfast. 30 capsule 0  . [START ON 02/29/2020] lisdexamfetamine (VYVANSE) 60 MG capsule Take 1 capsule (60 mg total) by mouth daily after breakfast. 30 capsule 0   No facility-administered medications prior to visit.  Per HPI unless specifically indicated in ROS section below Review of Systems Objective:  BP 122/84 (BP Location: Left Arm, Patient Position: Sitting, Cuff Size: Normal)   Pulse 92   Temp 97.9 F (36.6 C) (Temporal)   Ht 5' 5.5" (1.664 m)   Wt 146 lb 4 oz (66.3 kg)   SpO2 99%   BMI 23.97 kg/m   Wt Readings from Last 3 Encounters:  02/08/20 146 lb 4 oz (66.3 kg)  01/05/20 146 lb 8 oz (66.5 kg)  12/03/19 148 lb 3.2 oz (67.2 kg)      Physical Exam Vitals and nursing note reviewed.  Constitutional:      Appearance: Normal appearance. She is not ill-appearing.  Neck:     Thyroid: No thyroid mass or  thyromegaly.  Cardiovascular:     Rate and Rhythm: Normal rate and regular rhythm.     Pulses: Normal pulses.     Heart sounds: Normal heart sounds. No murmur heard.   Pulmonary:     Effort: Pulmonary effort is normal. No respiratory distress.     Breath sounds: Normal breath sounds. No wheezing, rhonchi or rales.  Musculoskeletal:     Right lower leg: No edema.     Left lower leg: No edema.  Skin:    General: Skin is warm and dry.     Findings: No rash.  Neurological:     Mental Status: She is alert.  Psychiatric:        Mood and Affect: Mood normal.        Behavior: Behavior normal.       Results for orders placed or performed in visit on 01/15/20  Lipid panel  Result Value Ref Range   Cholesterol 187 0 - 200 mg/dL   Triglycerides 155.0 (H) 0 - 149 mg/dL   HDL 53.90 >39.00 mg/dL   VLDL 31.0 0.0 - 40.0 mg/dL   LDL Cholesterol 102 (H) 0 - 99 mg/dL   Total CHOL/HDL Ratio 3    NonHDL 133.01   TSH  Result Value Ref Range   TSH 3.30 0.35 - 4.50 uIU/mL  Basic metabolic panel  Result Value Ref Range   Sodium 135 135 - 145 mEq/L   Potassium 3.8 3.5 - 5.1 mEq/L   Chloride 99 96 - 112 mEq/L   CO2 28 19 - 32 mEq/L   Glucose, Bld 100 (H) 70 - 99 mg/dL   BUN 13 6 - 23 mg/dL   Creatinine, Ser 0.89 0.40 - 1.20 mg/dL   GFR 70.63 >60.00 mL/min   Calcium 9.2 8.4 - 10.5 mg/dL   Assessment & Plan:  This visit occurred during the SARS-CoV-2 public health emergency.  Safety protocols were in place, including screening questions prior to the visit, additional usage of staff PPE, and extensive cleaning of exam room while observing appropriate contact time as indicated for disinfecting solutions.   Problem List Items Addressed This Visit    Raynaud's syndrome    CCB should help this during winter months.       Relevant Medications   amLODipine (NORVASC) 2.5 MG tablet   Essential hypertension - Primary    Great control on low dose amlodipine - continue. Encouraged good water  intake,limiting salt, increasing aerobic exercise.       Relevant Medications   amLODipine (NORVASC) 2.5 MG tablet       Meds ordered this encounter  Medications  . amLODipine (NORVASC) 2.5 MG tablet    Sig: Take 1 tablet (2.5 mg total)  by mouth daily.    Dispense:  90 tablet    Refill:  3   No orders of the defined types were placed in this encounter.   Patient Instructions  Blood pressures are much improved on low dose amlodipine - continue this. Happy birthday! Return as needed or for next physical after 01/04/2021   Follow up plan: Return if symptoms worsen or fail to improve.  Ria Bush, MD

## 2020-02-08 NOTE — Assessment & Plan Note (Signed)
CCB should help this during winter months.

## 2020-02-25 ENCOUNTER — Encounter: Payer: Self-pay | Admitting: Psychiatry

## 2020-03-07 ENCOUNTER — Other Ambulatory Visit: Payer: Self-pay | Admitting: Neurology

## 2020-03-07 MED ORDER — AJOVY 225 MG/1.5ML ~~LOC~~ SOAJ: 225.0000 mg | SUBCUTANEOUS | 3 refills | Status: DC

## 2020-03-08 ENCOUNTER — Other Ambulatory Visit: Payer: Self-pay | Admitting: Neurology

## 2020-03-08 MED ORDER — AJOVY 225 MG/1.5ML ~~LOC~~ SOAJ: 225.0000 mg | SUBCUTANEOUS | 3 refills | Status: DC

## 2020-03-08 NOTE — Addendum Note (Signed)
Addended by: Gildardo Griffes on: 03/08/2020 02:51 PM   Modules accepted: Orders

## 2020-03-10 NOTE — Telephone Encounter (Signed)
Ajovy PA completed on Cover My Meds. Key: [id number]. Awaiting determination from [place].

## 2020-03-17 NOTE — Telephone Encounter (Addendum)
Per Cover My Meds, message from plan: This request has not been approved. We were asked to perform a prior authorization for coverage of the drug listed at the top of this letter under your pharmacy benefit. We denied this request based on our Pharmacy Benefit Formulary Exception Document. Your plan covers this drug when it is being used to treat certain medical conditions supported by the medical literature. Based on the information submitted for review, your medical condition does not meet this rule. Our guideline named FREMANEZUMAB-VFRM (Ajovy) requires the following rule(s) be met for approval for the treatment of chronic migraines: 1. You have previously tried one of the following preventative migraine treatments: valproic acid/divalproex sodium, topiramate, propranolol, timolol, metoprolol, amitriptyline, venlafaxine, atenolol, nadolol, or Botox [Note: For Botox, previous trial of only [id number] or [id number] are allowable].2. You have previously tried Transport planner. We do not have information showing you have met the rules mentioned above. This is why your request is denied. Please work with your doctor to use a different medication or get [place] more information if it will allow [place] to approve this request. A written notification letter will follow with additional details.   If we choose to appeal, patient can appeal or designate provider and should contact [phone] to learn how she can appoint a representative.   Appeals Mail Address Information: [facility] [address] [place] [address] or Fax [phone]  Patient should be able to use the savings card to continue getting this from the pharmacy despite denial.

## 2020-04-05 ENCOUNTER — Other Ambulatory Visit: Payer: Self-pay | Admitting: Psychiatry

## 2020-04-05 DIAGNOSIS — F902 Attention-deficit hyperactivity disorder, combined type: Secondary | ICD-10-CM

## 2020-04-07 ENCOUNTER — Other Ambulatory Visit: Payer: Self-pay | Admitting: Psychiatry

## 2020-04-07 NOTE — Telephone Encounter (Signed)
60 mg Vyvanse is last dispensed October 26 per [place] registry now all escriptions have been dispensed needing monthly supply until appointment in February with Deloria Lair, DNP escribing #30 with no refill now to [facility] medically necessary no contraindication including in the last year per epic or Registry.

## 2020-04-07 NOTE — Telephone Encounter (Signed)
Caitlin Burns called to check on status of her refill for Vyvanse.  She has appt as follow up with Barnett Applebaum in February. Refill was requested 11/30.  Please sent in the refill.

## 2020-04-13 ENCOUNTER — Other Ambulatory Visit: Payer: Self-pay | Admitting: Dermatology

## 2020-05-04 ENCOUNTER — Other Ambulatory Visit: Payer: Self-pay | Admitting: Psychiatry

## 2020-05-04 ENCOUNTER — Telehealth: Payer: Self-pay | Admitting: Psychiatry

## 2020-05-04 DIAGNOSIS — F902 Attention-deficit hyperactivity disorder, combined type: Secondary | ICD-10-CM

## 2020-05-04 NOTE — Telephone Encounter (Signed)
Last fill of Vyvanse 60 mg was dispensed 04/07/2020 now at [facility] holiday medically necessary with no contraindication in the last year to renew the Vyvanse again #30 to [facility] employee pharmacy.

## 2020-06-06 ENCOUNTER — Other Ambulatory Visit: Payer: Self-pay | Admitting: Psychiatry

## 2020-06-06 DIAGNOSIS — F902 Attention-deficit hyperactivity disorder, combined type: Secondary | ICD-10-CM

## 2020-06-07 ENCOUNTER — Other Ambulatory Visit: Payer: Self-pay | Admitting: Adult Health

## 2020-06-07 ENCOUNTER — Encounter: Payer: Self-pay | Admitting: *Deleted

## 2020-06-07 NOTE — Telephone Encounter (Signed)
Has apt with you 06/22/20, [place] patient

## 2020-06-20 ENCOUNTER — Other Ambulatory Visit: Payer: Self-pay | Admitting: Psychiatry

## 2020-06-20 DIAGNOSIS — F411 Generalized anxiety disorder: Secondary | ICD-10-CM

## 2020-06-20 DIAGNOSIS — F902 Attention-deficit hyperactivity disorder, combined type: Secondary | ICD-10-CM

## 2020-06-21 ENCOUNTER — Other Ambulatory Visit: Payer: Self-pay | Admitting: Adult Health

## 2020-06-22 ENCOUNTER — Ambulatory Visit: Payer: 59 | Admitting: Adult Health

## 2020-06-27 NOTE — Telephone Encounter (Signed)
Spoke with pt this afternoon. She reports Nurtec works well for her breakthrough migraines. I completed a renewal PA that we received from [facility]. KEY: [id number].   The request has been approved. The authorization is effective for a maximum of 12 fills from 06/27/2020 to 06/26/2021, as long as the member is enrolled in their current health plan. This has been approved for a quantity limit of 18.0 with a day supply limit of 30.0. A written notification letter will follow with additional details.

## 2020-07-08 ENCOUNTER — Other Ambulatory Visit: Payer: Self-pay

## 2020-07-08 ENCOUNTER — Other Ambulatory Visit: Payer: Self-pay | Admitting: Adult Health

## 2020-07-08 ENCOUNTER — Encounter: Payer: Self-pay | Admitting: Adult Health

## 2020-07-08 ENCOUNTER — Ambulatory Visit (INDEPENDENT_AMBULATORY_CARE_PROVIDER_SITE_OTHER): Payer: 59 | Admitting: Adult Health

## 2020-07-08 DIAGNOSIS — F411 Generalized anxiety disorder: Secondary | ICD-10-CM | POA: Diagnosis not present

## 2020-07-08 DIAGNOSIS — F410 Panic disorder [episodic paroxysmal anxiety] without agoraphobia: Secondary | ICD-10-CM

## 2020-07-08 DIAGNOSIS — F902 Attention-deficit hyperactivity disorder, combined type: Secondary | ICD-10-CM

## 2020-07-08 MED ORDER — ALPRAZOLAM 0.5 MG PO TABS: 0.5000 mg | ORAL_TABLET | Freq: Two times a day (BID) | ORAL | 0 refills | Status: DC | PRN

## 2020-07-08 MED ORDER — LISDEXAMFETAMINE DIMESYLATE 60 MG PO CAPS: 60.0000 mg | ORAL_CAPSULE | Freq: Every day | ORAL | 0 refills | Status: DC

## 2020-07-08 MED ORDER — BUPROPION HCL ER (XL) 150 MG PO TB24: ORAL_TABLET | ORAL | 3 refills | Status: DC
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
## 2020-07-21 ENCOUNTER — Encounter: Payer: Self-pay | Admitting: Family Medicine

## 2020-07-22 ENCOUNTER — Other Ambulatory Visit: Payer: Self-pay | Admitting: Family Medicine

## 2020-07-22 MED ORDER — OSELTAMIVIR PHOSPHATE 75 MG PO CAPS: 75.0000 mg | ORAL_CAPSULE | Freq: Two times a day (BID) | ORAL | 0 refills | Status: DC

## 2020-07-28 ENCOUNTER — Encounter: Payer: Self-pay | Admitting: Family Medicine

## 2020-07-29 ENCOUNTER — Other Ambulatory Visit: Payer: Self-pay | Admitting: Radiation Oncology

## 2020-08-04 ENCOUNTER — Ambulatory Visit: Payer: Self-pay

## 2020-08-04 ENCOUNTER — Other Ambulatory Visit: Payer: Self-pay

## 2020-08-04 ENCOUNTER — Telehealth: Payer: Self-pay

## 2020-08-04 ENCOUNTER — Emergency Department: Payer: 59

## 2020-08-04 ENCOUNTER — Emergency Department
Admission: EM | Admit: 2020-08-04 | Discharge: 2020-08-04 | Disposition: A | Discharge status: Home / Self Care | Payer: 59 | Attending: Emergency Medicine | Admitting: Emergency Medicine

## 2020-08-04 ENCOUNTER — Ambulatory Visit
Admission: EM | Admit: 2020-08-04 | Discharge: 2020-08-04 | Disposition: A | Discharge status: Home / Self Care | Payer: 59

## 2020-08-04 ENCOUNTER — Encounter: Payer: Self-pay | Admitting: Family Medicine

## 2020-08-04 DIAGNOSIS — R519 Headache, unspecified: Secondary | ICD-10-CM | POA: Insufficient documentation

## 2020-08-04 DIAGNOSIS — J3489 Other specified disorders of nose and nasal sinuses: Secondary | ICD-10-CM | POA: Diagnosis not present

## 2020-08-04 DIAGNOSIS — E119 Type 2 diabetes mellitus without complications: Secondary | ICD-10-CM | POA: Diagnosis not present

## 2020-08-04 DIAGNOSIS — H538 Other visual disturbances: Secondary | ICD-10-CM | POA: Diagnosis not present

## 2020-08-04 DIAGNOSIS — R079 Chest pain, unspecified: Secondary | ICD-10-CM | POA: Diagnosis not present

## 2020-08-04 DIAGNOSIS — Z79899 Other long term (current) drug therapy: Secondary | ICD-10-CM | POA: Insufficient documentation

## 2020-08-04 DIAGNOSIS — J45909 Unspecified asthma, uncomplicated: Secondary | ICD-10-CM | POA: Diagnosis not present

## 2020-08-04 DIAGNOSIS — H539 Unspecified visual disturbance: Secondary | ICD-10-CM

## 2020-08-04 DIAGNOSIS — R42 Dizziness and giddiness: Secondary | ICD-10-CM | POA: Diagnosis not present

## 2020-08-04 DIAGNOSIS — I10 Essential (primary) hypertension: Secondary | ICD-10-CM | POA: Diagnosis not present

## 2020-08-04 LAB — DIFFERENTIAL
Abs Immature Granulocytes: 0.02 10*3/uL (ref 0.00–0.07)
Basophils Absolute: 0 10*3/uL (ref 0.0–0.1)
Basophils Relative: 1 %
Eosinophils Absolute: 0.2 10*3/uL (ref 0.0–0.5)
Eosinophils Relative: 2 %
Immature Granulocytes: 0 %
Lymphocytes Relative: 33 %
Lymphs Abs: 2.4 10*3/uL (ref 0.7–4.0)
Monocytes Absolute: 0.4 10*3/uL (ref 0.1–1.0)
Monocytes Relative: 5 %
Neutro Abs: 4.3 10*3/uL (ref 1.7–7.7)
Neutrophils Relative %: 59 %

## 2020-08-04 LAB — CBC
HCT: 38.3 % (ref 36.0–46.0)
Hemoglobin: 14 g/dL (ref 12.0–15.0)
MCH: 30.5 pg (ref 26.0–34.0)
MCHC: 36.6 g/dL — ABNORMAL HIGH (ref 30.0–36.0)
MCV: 83.4 fL (ref 80.0–100.0)
Platelets: 346 10*3/uL (ref 150–400)
RBC: 4.59 MIL/uL (ref 3.87–5.11)
RDW: 11.9 % (ref 11.5–15.5)
WBC: 7.3 10*3/uL (ref 4.0–10.5)
nRBC: 0 % (ref 0.0–0.2)

## 2020-08-04 LAB — COMPREHENSIVE METABOLIC PANEL
ALT: 6 U/L (ref 0–44)
AST: 12 U/L — ABNORMAL LOW (ref 15–41)
Albumin: 4.1 g/dL (ref 3.5–5.0)
Alkaline Phosphatase: 45 U/L (ref 38–126)
Anion gap: 9 (ref 5–15)
BUN: 11 mg/dL (ref 6–20)
CO2: 25 mmol/L (ref 22–32)
Calcium: 9.3 mg/dL (ref 8.9–10.3)
Chloride: 102 mmol/L (ref 98–111)
Creatinine, Ser: 0.76 mg/dL (ref 0.44–1.00)
GFR, Estimated: >60 mL/min (ref >60)
Glucose, Bld: 111 mg/dL — ABNORMAL HIGH (ref 70–99)
Potassium: 3.9 mmol/L (ref 3.5–5.1)
Sodium: 136 mmol/L (ref 135–145)
Total Bilirubin: 0.5 mg/dL (ref 0.3–1.2)
Total Protein: 8.2 g/dL — ABNORMAL HIGH (ref 6.5–8.1)

## 2020-08-04 LAB — PROTIME-INR
INR: 1.1 (ref 0.8–1.2)
Prothrombin Time: 13.5 seconds (ref 11.4–15.2)

## 2020-08-04 LAB — TROPONIN I (HIGH SENSITIVITY)
Troponin I (High Sensitivity): <2 ng/L (ref <18)
Troponin I (High Sensitivity): 4 ng/L (ref <18)

## 2020-08-04 LAB — POC URINE PREG, ED: Preg Test, Ur: NEGATIVE

## 2020-08-04 LAB — APTT: aPTT: 32 seconds (ref 24–36)

## 2020-08-04 MED ORDER — METOCLOPRAMIDE HCL 5 MG/ML IJ SOLN
10.0000 mg | Freq: Once | INTRAMUSCULAR | Status: AC
  Administered 2020-08-04: 10 mg via INTRAVENOUS
  Filled 2020-08-04: qty 2

## 2020-08-04 MED ORDER — DIPHENHYDRAMINE HCL 50 MG/ML IJ SOLN
25.0000 mg | Freq: Once | INTRAMUSCULAR | Status: AC
  Administered 2020-08-04: 25 mg via INTRAVENOUS
  Filled 2020-08-04: qty 1

## 2020-08-04 MED ORDER — CLONIDINE HCL 0.1 MG PO TABS: 0.2000 mg | ORAL_TABLET | Freq: Once | ORAL | Status: DC

## 2020-08-04 NOTE — Discharge Instructions (Addendum)
Take your normal migraine medication as needed.  Continue your blood pressure medication as prescribed.  Follow-up with your primary care doctor within the next several days.  Return to the ER for new, worsening, or persistent severe headache, blurred vision or other vision changes, eye pain, dizziness, difficulty with speech or balance, weakness or numbness, chest pain, or any other new or worsening symptoms that concern you.

## 2020-08-04 NOTE — ED Provider Notes (Signed)
[name] Emergency Department Provider Note ____________________________________________   Event Date/Time   First MD Initiated Contact with Patient 08/04/20 1339     (approximate)  I have reviewed the triage vital signs and the nursing notes.   HISTORY  Chief Complaint Hypertension    HPI Caitlin Burns is a 40 y.o. female with PMH as noted below including hypertension on amlodipine as well as a history of migraines who presents with blurred vision since yesterday associated with elevated blood pressure and a headache.  The patient states that yesterday evening she noted blurred vision in her right eye.  There is no associated eye pain.  She states that today the blurred vision has moved to the left eye although the right eye still seems more blurred.  She has never had this symptom before associated with her migraines.  The patient denies any flashes, floaters, or other visual disturbances.  At the same time she noted her blood pressure to be elevated to the 160s yesterday and 170s today.  She took her amlodipine yesterday and today.  She has some mild pain in the chest.  The patient also reports a headache.  This started gradually yesterday and has persisted today.  It is bilateral and frontal and had minimal relief from Tylenol.  The patient states that otherwise it feels similar to prior headaches that she has had.  She denies associated nausea or vomiting.   Past Medical History:  Diagnosis Date  . Anxiety 10/06/2010  . Asthma 10/06/2010  . ATTENTION DEFICIT HYPERACTIVITY DISORDER, 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  . DEPRESSION 07/14/2007  . Diabetes mellitus without complication ([place])    gestational  . Essential hypertension, benign 03/24/2010  . GERD (gastroesophageal reflux disease)   . Postpartum care following vaginal delivery 01/27/2013  . 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 SVD (spontaneous vaginal delivery) 01/27/2013    Patient Active Problem List    Diagnosis Date Noted  . Motion sickness 11/08/2019  . Panic disorder 05/18/2018  . Generalized anxiety disorder 05/18/2018  . Attention deficit hyperactivity disorder, combined type 05/18/2018  . Episodic lightheadedness 06/10/2014  . GERD (gastroesophageal reflux disease) 01/17/2012  . 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 Health maintenance examination 10/06/2010  . Asthma 10/06/2010  . ECHOCARDIOGRAM, ABNORMAL 04/17/2010  . Essential hypertension 03/24/2010  . Palpitations 03/24/2010  . Raynaud's syndrome 03/23/2010  . Chronic migraine without aura with status migrainosus, not intractable 07/14/2007    Past Surgical History:  Procedure Laterality Date  . REFRACTIVE SURGERY  2004   Lasik  . WISDOM TOOTH EXTRACTION      Prior to Admission medications   Medication Sig Start Date End Date Taking? Authorizing Provider  albuterol (VENTOLIN HFA) 108 (90 Base) MCG/ACT inhaler Inhale 1-2 puffs into the lungs every 6 (six) hours as needed for wheezing or shortness of breath. 11/06/19   Ria Bush, MD  ALPRAZolam Duanne Moron) 0.5 MG tablet Take 1 tablet (0.5 mg total) by mouth 2 (two) times daily as needed for anxiety. 07/08/20   M[place]ingo, Berdie Ogren, NP  amLODipine (NORVASC) 2.5 MG tablet Take 1 tablet (2.5 mg total) by mouth daily. 02/08/20   Ria Bush, MD  buPROPion (WELLBUTRIN XL) 150 MG 24 hr tablet TAKE 1 TABLET BY MOUTH DAILY AFTER BREAKFAST 07/08/20   Mozingo, Berdie Ogren, NP  cyclobenzaprine (FLEXERIL) 5 MG tablet Take 1-2 tablets (5-10 mg total) by mouth 2 (two) times daily as needed (migraine headache -  sedation precautions). 11/06/19   Ria Bush, MD  drospirenone-ethinyl estradiol Sherrill Raring) 3-0.02 MG tablet  02/11/17   [provider]  Fremanezumab-vfrm (AJOVY) 225 MG/1.5ML SOAJ Inject 225 mg into the skin every 30 (thirty) days. 03/08/20   Melvenia Beam, MD  lisdexamfetamine (VYVANSE) 60  MG capsule Take 1 capsule (60 mg total) by mouth daily after breakfast. 12/31/19 01/30/20  Delight Hoh, MD  lisdexamfetamine (VYVANSE) 60 MG capsule Take 1 capsule (60 mg total) by mouth daily after breakfast. 01/30/20 02/29/20  Delight Hoh, MD  lisdexamfetamine (VYVANSE) 60 MG capsule Take 1 capsule (60 mg total) by mouth daily. 07/08/20   Mozingo, Berdie Ogren, NP  ondansetron (ZOFRAN) 4 MG tablet Take 1 tablet (4 mg total) by mouth every 8 (eight) hours as needed for nausea or vomiting. 07/24/19   Zigmund Gottron, NP  ondansetron (ZOFRAN-ODT) 4 MG disintegrating tablet Take 1 tablet (4 mg total) by mouth every 8 (eight) hours as needed for nausea. 12/03/19   Melvenia Beam, MD  oseltamivir (TAMIFLU) 75 MG capsule Take 1 capsule (75 mg total) by mouth 2 (two) times daily. 07/22/20   Ria Bush, MD  pantoprazole (PROTONIX) 40 MG tablet Take 40 mg by mouth daily.    [provider]  Rimegepant Sulfate (NURTEC) 75 MG TBDP Take 75 mg by mouth daily as needed. For migraines. Take as close to onset of migraine as possible. One daily maximum. 12/03/19   Melvenia Beam, MD  scopolamine (TRANSDERM-SCOP, 1.5 MG,) 1 MG/3DAYS Place 1 patch (1.5 mg total) onto the skin every 3 (three) days. 11/06/19   Ria Bush, MD    Allergies Advil [ibuprofen], Aspirin, and Sulfonamide derivatives  Family History  Problem Relation Age of Onset  . Raynaud syndrome Mother   . Osteopenia Mother   . Arthritis Mother        OA, osteopenia  . Migraines Mother   . Kidney Stones Mother   . Other Mother        benign brain tumor  . Hypertension Father   . Hyperlipidemia Father   . Coronary artery disease Father   . Diabetes Father   . Heart disease Father   . Cancer Father        melanoma, extramammary paget's   . Heart attack Father   . Migraines Sister   . Lung cancer Paternal Grandfather   . Brain cancer Paternal Grandfather   . Aneurysm Maternal Grandmother 30       brain  .  Migraines Maternal Grandmother   . Congestive Heart Failure Maternal Grandmother   . COPD Maternal Grandmother   . Hypertension Maternal Grandmother   . Osteoporosis Maternal Grandmother   . Varicose Veins Maternal Grandmother   . Heart disease Maternal Grandfather   . Kidney Stones Maternal Grandfather   . Emphysema Maternal Grandfather   . Heart disease Paternal Grandmother   . Asthma Paternal Grandmother   . Diabetes Paternal Grandmother   . Hypertension Paternal Grandmother   . CVA Paternal Grandmother   . Epilepsy Sister     Social History Social History   Tobacco Use  . Smoking status: Never Smoker  . Smokeless tobacco: Never Used  Vaping Use  . Vaping Use: Never used  Substance Use Topics  . Alcohol use: Yes    Comment: rare  . Drug use: No    Review of Systems  Constitutional: No fever. Eyes: Positive for blurred vision. ENT: No sore throat. Cardiovascular: Positive for chest  pain. Respiratory: Denies shortness of breath. Gastrointestinal: No vomiting or diarrhea.  Genitourinary: Negative for dysuria.  Musculoskeletal: Negative for back pain. Skin: Negative for rash. Neurological: Positive for headache.  Negative for weakness or numbness.   ____________________________________________   PHYSICAL EXAM:  VITAL SIGNS: ED Triage Vitals  Enc Vitals Group     BP 08/04/20 1145 (!) 172/122     Pulse Rate 08/04/20 1145 (!) 111     Resp 08/04/20 1145 16     Temp 08/04/20 1145 98.5 F (36.9 C)     Temp Source 08/04/20 1145 Oral     SpO2 08/04/20 1145 100 %     Weight 08/04/20 1152 145 lb (65.8 kg)     Height 08/04/20 1152 5\' 5"  (1.651 m)     Head Circumference --      Peak Flow --      Pain Score 08/04/20 1152 0     Pain Loc --      Pain Edu? --      Excl. in [place]? --     Constitutional: Alert and oriented. Well appearing and in no acute distress. Eyes: Conjunctivae are normal.  EOMI.  PERRLA. Head: Atraumatic. Nose: No  congestion/rhinnorhea. Mouth/Throat: Mucous membranes are moist.   Neck: Normal range of motion.  Cardiovascular: Normal rate, regular rhythm.   Good peripheral circulation. Respiratory: Normal respiratory effort.  No retractions. Gastrointestinal: No distention.  Musculoskeletal: Extremities warm and well perfused.  Neurologic:  Normal speech and language.  Motor and sensory intact in all extremities.  Normal coordination.  No ataxia.  No pronator drift.  No facial droop. Skin:  Skin is warm and dry. No rash noted. Psychiatric: Mood and affect are normal. Speech and behavior are normal.  ____________________________________________   LABS (all labs ordered are listed, but only abnormal results are displayed)  Labs Reviewed  CBC - Abnormal; Notable for the following components:      Result Value   MCHC 36.6 (*)    All other components within normal limits  COMPREHENSIVE METABOLIC PANEL - Abnormal; Notable for the following components:   Glucose, Bld 111 (*)    Total Protein 8.2 (*)    AST 12 (*)    All other components within normal limits  POC URINE PREG, ED - Normal  PROTIME-INR  APTT  DIFFERENTIAL  CBG MONITORING, ED  I-STAT CREATININE, ED  TROPONIN I (HIGH SENSITIVITY)  TROPONIN I (HIGH SENSITIVITY)   ____________________________________________  EKG  ED ECG REPORT I, Arta Silence, the attending physician, personally viewed and interpreted this ECG.  Date: 08/04/2020 EKG Time: 1154 Rate: 94 Rhythm: normal sinus rhythm QRS Axis: Right axis Intervals: RBBB ST/T Wave abnormalities: normal Narrative Interpretation: no evidence of acute ischemia  ____________________________________________  RADIOLOGY  CT head: No acute abnormality MR brain: No acute abnormality  ____________________________________________   PROCEDURES  Procedure(s) performed: No  Procedures  Critical Care performed: No ____________________________________________   INITIAL  IMPRESSION / ASSESSMENT AND PLAN / ED COURSE  Pertinent labs & imaging results that were available during my care of the patient were reviewed by me and considered in my medical decision making (see chart for details).  40 year old female with history of hypertension on amlodipine and migraines presents with bilateral blurred vision (although more severe on the right) associated with a bifrontal headache and elevated blood pressure.  The patient also incidentally reports mild chest pain.  Onset probably symptoms was yesterday.  On exam, the patient is overall well-appearing.  Her vital signs  are normal except for hypertension.  She was tachycardic at triage but this has resolved.  Physical exam is otherwise unremarkable.  Neurologic exam is normal.  1.  Vision disturbance: Most likely etiology is complex migraine versus possible symptoms related to elevated blood pressure.  Given the bilateral nature of the symptoms I do not suspect acute stroke, TIA, aneurysm, hemorrhage, or intrinsic ocular cause.  We will obtain a CT, and if normal, an MRI.  2.  Hypertension: Blood pressure is moderately elevated here.  Patient reports that she is compliant with amlodipine.  We will give p.o. clonidine to start.  Other than the vision disturbance which may be unrelated, there is no evidence of end organ dysfunction.  We will obtain a troponin to rule out ACS although my clinical suspicion is very low.  3.  Headache: Likely migraine versus related to blood pressure.  If it does not improve with treatment of the hypertension, we will give migraine cocktail.  ----------------------------------------- 8:07 PM on 08/04/2020 -----------------------------------------  The hypertension improved almost immediately after my initial evaluation and the blood pressure has remained stable for the last several hours without intervention.  We did not end up needing to give an antihypertensive here.  Lab work-up was  unremarkable.  The CT head was negative so I proceeded with an MRI which is also negative for acute findings.  The patient reported mild improvement in the headache although it subsequently returned.  At that time it was more on the right side and she stated it was typical of her migraines.  The patient has continued to endorse subjectively slightly blurred vision bilaterally although visual acuity is 20/25 in both eyes (in 20/20 combined) which is consistent with her baseline.  At this time there is no evidence of CVA, aneurysm, primary ocular etiology or other concerning cause.  Most likely etiology is complex migraine.  Given that the patient has no neuro deficits, stable vital signs, and reassuring imaging, there is no indication for further ED work-up, urgent neurology consultation, or admission.  The patient herself feels well and wants to go home.  I counseled her on the results of the work-up and plan of care.  Return precautions given, she expresses understanding.   ____________________________________________   FINAL CLINICAL IMPRESSION(S) / ED DIAGNOSES  Final diagnoses:  Acute nonintractable headache, unspecified headache type  Vision disturbance      NEW MEDICATIONS STARTED DURING THIS VISIT:  Discharge Medication List as of 08/04/2020  8:07 PM       Note:  This document was prepared using Dragon voice recognition software and may include unintentional dictation errors.   Arta Silence, MD 08/04/20 2111

## 2020-08-04 NOTE — Telephone Encounter (Signed)
Pt called and said that last night pt suddenly had blurred vision in rt eye that continues this morning.pt does not usually have problem with vision; never had blurred vision like this before. Pt had H/A on 08/03/20; BP on 08/04/19 was 152/111 P 78 and reck BP later 167/114; pt has not missed taking the amlodipine 25 mg taking one daily. Pt took amlodipine this morning at 6:15 am and BP this morning at 8:15 AM was 159/101 P 104.pt had dull to sharp H/A that comes and goes this morning. Pt also has chest tightness and difficulty getting deep breath on and off. Pt works at [employer] and will finish her 11 AM pt and then someone will drive pt to [facility] in [place]; pt does not want to go to [facility] ED. Sending note to Dr Darnell Level who is out of office and Dr Damita Dunnings who is in office.pt said she was in no distress at this time.

## 2020-08-04 NOTE — ED Triage Notes (Addendum)
Pt to ER with hypertension and a nagging headache, pt reports vision changes to right eye, states vision is blurry. Pt with history of complex migraines. Pt has history of HTN and takes medication at home, took morning meds with no improvements in BP. Pt reports symptoms started last night prior to going to bed. Pt reports generalized feeling of feeling "weird" and "off", denies feeling numbness or tingling. Bilateral grip strength equal and strong. Hx of Raynauds.

## 2020-08-04 NOTE — ED Notes (Signed)
Pt arrived back from MRI.

## 2020-08-04 NOTE — Telephone Encounter (Signed)
Agree with ER eval. plz call tomorrow for update on ER visit.

## 2020-08-04 NOTE — Telephone Encounter (Signed)
Agree with eval.  Routed to PCP as FYI.

## 2020-08-04 NOTE — ED Notes (Signed)
Patient is being discharged from the Urgent Care and sent to the Emergency Department via POV . Per Loura Halt, NP, patient is in need of higher level of care due to patient complaint and need for resources not available at this location. Patient is aware and verbalizes understanding of plan of care. There were no vitals filed for this visit.

## 2020-08-05 ENCOUNTER — Encounter: Payer: Self-pay | Admitting: Family Medicine

## 2020-08-05 DIAGNOSIS — H538 Other visual disturbances: Secondary | ICD-10-CM | POA: Diagnosis not present

## 2020-08-05 NOTE — Telephone Encounter (Signed)
Pt responded via MyChart.  (see Pt Msg, today)

## 2020-08-05 NOTE — Telephone Encounter (Signed)
Lvm asking pt to call back.  Need update on pt's sxs.

## 2020-08-09 ENCOUNTER — Other Ambulatory Visit: Payer: Self-pay

## 2020-08-09 MED FILL — Alprazolam Tab 0.5 MG: ORAL | 90 days supply | Qty: 180 | Fill #0 | Status: AC

## 2020-08-10 ENCOUNTER — Other Ambulatory Visit: Payer: Self-pay

## 2020-08-17 ENCOUNTER — Other Ambulatory Visit: Payer: Self-pay

## 2020-08-17 MED FILL — Drospirenone-Ethinyl Estradiol Tab 3-0.02 MG: ORAL | 84 days supply | Qty: 112 | Fill #0 | Status: AC

## 2020-08-18 ENCOUNTER — Other Ambulatory Visit: Payer: Self-pay

## 2020-08-18 ENCOUNTER — Other Ambulatory Visit: Payer: Self-pay | Admitting: Family Medicine

## 2020-08-18 MED ORDER — AMLODIPINE BESYLATE 2.5 MG PO TABS
2.5000 mg | ORAL_TABLET | Freq: Every day | ORAL | 1 refills | Status: DC
  Filled 2020-08-18: qty 90, 90d supply, fill #0

## 2020-08-18 NOTE — Telephone Encounter (Signed)
According to pt's chart, should have refills through 02/2021.    Spoke with [facility] asking about refill request.  States they are still having a few issues with new system and they do see pt has refills available.  Suggests denying request.   Request denied.

## 2020-08-18 NOTE — Telephone Encounter (Signed)
Received faxed message from [facility] stating they previously misspoke and pt is out of refills.  E-scribed refill.

## 2020-08-19 ENCOUNTER — Other Ambulatory Visit: Payer: Self-pay

## 2020-08-23 ENCOUNTER — Other Ambulatory Visit: Payer: Self-pay

## 2020-09-08 NOTE — Telephone Encounter (Signed)
Seeing if pt can come in tomorrow for eval.

## 2020-09-09 ENCOUNTER — Other Ambulatory Visit: Payer: Self-pay

## 2020-09-09 ENCOUNTER — Ambulatory Visit: Payer: 59 | Admitting: Family Medicine

## 2020-09-09 ENCOUNTER — Encounter: Payer: Self-pay | Admitting: Family Medicine

## 2020-09-09 VITALS — BP 148/96 | HR 91 | Temp 98.0°F | Ht 65.0 in | Wt 150.6 lb

## 2020-09-09 DIAGNOSIS — Z1589 Genetic susceptibility to other disease: Secondary | ICD-10-CM

## 2020-09-09 DIAGNOSIS — I10 Essential (primary) hypertension: Secondary | ICD-10-CM | POA: Diagnosis not present

## 2020-09-09 DIAGNOSIS — I7300 Raynaud's syndrome without gangrene: Secondary | ICD-10-CM

## 2020-09-09 DIAGNOSIS — I4510 Unspecified right bundle-branch block: Secondary | ICD-10-CM | POA: Insufficient documentation

## 2020-09-09 DIAGNOSIS — M533 Sacrococcygeal disorders, not elsewhere classified: Secondary | ICD-10-CM

## 2020-09-09 DIAGNOSIS — M2560 Stiffness of unspecified joint, not elsewhere classified: Secondary | ICD-10-CM

## 2020-09-09 DIAGNOSIS — M2550 Pain in unspecified joint: Secondary | ICD-10-CM

## 2020-09-09 DIAGNOSIS — G8929 Other chronic pain: Secondary | ICD-10-CM

## 2020-09-09 DIAGNOSIS — J4520 Mild intermittent asthma, uncomplicated: Secondary | ICD-10-CM | POA: Diagnosis not present

## 2020-09-09 DIAGNOSIS — G43701 Chronic migraine without aura, not intractable, with status migrainosus: Secondary | ICD-10-CM

## 2020-09-09 LAB — TSH: TSH: 1.32 u[IU]/mL (ref 0.35–4.50)

## 2020-09-09 LAB — SEDIMENTATION RATE: Sed Rate: 14 mm/hr (ref 0–20)

## 2020-09-09 MED ORDER — QVAR REDIHALER 80 MCG/ACT IN AERB
1.0000 | INHALATION_SPRAY | Freq: Two times a day (BID) | RESPIRATORY_TRACT | 3 refills | Status: DC
  Filled 2020-09-09: qty 10.6, 30d supply, fill #0

## 2020-09-09 MED ORDER — FLOVENT HFA 110 MCG/ACT IN AERO
1.0000 | INHALATION_SPRAY | Freq: Two times a day (BID) | RESPIRATORY_TRACT | 6 refills | Status: DC
  Filled 2020-09-09: qty 12, 30d supply, fill #0

## 2020-09-09 MED ORDER — AMLODIPINE BESYLATE 5 MG PO TABS
5.0000 mg | ORAL_TABLET | Freq: Every day | ORAL | 3 refills | Status: DC
  Filled 2020-09-09: qty 90, 90d supply, fill #0
  Filled 2020-12-27: qty 90, 90d supply, fill #1

## 2020-09-09 NOTE — Assessment & Plan Note (Addendum)
Chronic, deteriorated.  Increase amlodipine to 5mg  daily.  Update with effect. See below for further plan.

## 2020-09-09 NOTE — Assessment & Plan Note (Addendum)
H/o this.  ?

## 2020-09-09 NOTE — Progress Notes (Signed)
Patient ID: Caitlin Burns, female    DOB: [birth year], 40 y.o.   MRN: [id number]  This visit was conducted in person.  BP (!) 148/96 (BP Location: Right Arm, Cuff Size: Normal)   Pulse 91   Temp 98 F (36.7 C) (Temporal)   Ht _0  (1.651 m)   Wt 150 lb 9.6 oz (68.3 kg)   SpO2 99%   BMI 25.06 kg/m    CC: HTN f/u  Subjective:   HPI: Caitlin Burns is a 40 y.o. female presenting on 09/09/2020 for Hypertension   See recent mychart message and recent ER visit.  HTN - Compliant with current antihypertensive regimen of amlodipine 2.5 mg daily. Did not increase dose yet. Does check blood pressures at home: 150s/90-100. No low blood pressure readings or symptoms of dizziness/syncope. Denies vision changes, SOB, leg swelling. No changes to correlate with increase in BP. She avoids salt/sodium in diet.   Notes HA and blood pressure elevation triggered by prolonged sitting.   Chest discomfort described as substernal tightness pressure in chest, not exertional, not relieved by rest, associate with some dyspnea. Known asthma managed with PRN albuterol. She does feel better when she uses albuterol. No night time awakenings. Uses albuterol inhaler about 2x/wk. No recent asthma flare.   Chronic headaches (migraines sees neuro) - manages with nurtec with benefit - not on Ajovy as it causes rash each time she injects.   Notes increased dry mouth despite increasing water intake. No dry eyes. No new joint pains or skin rash. Chronic joint pains, worse in am. Chronic R hip pain. Stays stiff in the mornings, this progressively improves as she is more active. Lateral feet hurt in the mornings.   Head CT and brain MRI at ER 07/2020 without acute findings.  Raynaud's flared this winter despite amlodipine.      Relevant past medical, surgical, family and social history reviewed and updated as indicated. Interim medical history since our last visit reviewed. Allergies and medications reviewed and  updated. Outpatient Medications Prior to Visit  Medication Sig Dispense Refill  . albuterol (VENTOLIN HFA) 108 (90 Base) MCG/ACT inhaler Inhale 1-2 puffs into the lungs every 6 (six) hours as needed for wheezing or shortness of breath. 18 g 3  . ALPRAZolam (XANAX) 0.5 MG tablet TAKE 1 TABLET BY MOUTH TWICE DAILY AS NEEDED FOR ANXIETY. 180 tablet 0  . azithromycin (ZITHROMAX) 250 MG tablet TAKE 2 TABLETS BY MOUTH ON DAY 1 THEN TAKE 1 TABLET DAILY FOR THE NEXT 4 DAYS (Patient taking differently: Take by mouth daily. for the next 4 days) 6 tablet 0  . buPROPion (WELLBUTRIN XL) 150 MG 24 hr tablet TAKE 1 TABLET BY MOUTH DAILY AFTER BREAKFAST 90 tablet 3  . cyclobenzaprine (FLEXERIL) 5 MG tablet Take 1-2 tablets (5-10 mg total) by mouth 2 (two) times daily as needed (migraine headache - sedation precautions). 30 tablet 1  . drospirenone-ethinyl estradiol (YAZ) 3-0.02 MG tablet TAKE 1 TABLET DAILY, CONTINUOUS ACTIVE PILLS 112 tablet 3  . drospirenone-ethinyl estradiol (YAZ,GIANVI,LORYNA) 3-0.02 MG tablet   11  . Fremanezumab-vfrm 225 MG/1.5ML SOAJ INJECT 225 MG INTO THE SKIN EVERY 30 (THIRTY) DAYS. 4.5 mL 3  . lisdexamfetamine (VYVANSE) 60 MG capsule TAKE 1 CAPSULE BY MOUTH DAILY. 90 capsule 0  . METRONIDAZOLE, TOPICAL, 0.75 % LOTN APPLY TO THE AFFECTED AREA(S) TO FACE ONCE OR TWICE DAILY AS DIRECTED 59 mL 2  . ondansetron (ZOFRAN) 4 MG tablet Take 1 tablet (4 mg total)  by mouth every 8 (eight) hours as needed for nausea or vomiting. 10 tablet 0  . ondansetron (ZOFRAN-ODT) 4 MG disintegrating tablet Take 1 tablet (4 mg total) by mouth every 8 (eight) hours as needed for nausea. 30 tablet 3  . pantoprazole (PROTONIX) 40 MG tablet Take 40 mg by mouth daily.    . pantoprazole (PROTONIX) 40 MG tablet TAKE 1 TABLET BY MOUTH ONCE DAILY 30 tablet 7  . Rimegepant Sulfate 75 MG TBDP TAKE 1 TABLET BY MOUTH DAILY AS NEEDED FOR MIGRAINES. TAKE AS CLOSE TO ONSET OF MIGRAINE AS POSSIBLE. ONE DAILY MAXIMUM. 8 tablet 6   . scopolamine (TRANSDERM-SCOP, 1.5 MG,) 1 MG/3DAYS Place 1 patch (1.5 mg total) onto the skin every 3 (three) days. 4 patch 3  . amLODipine (NORVASC) 2.5 MG tablet Take 1 tablet (2.5 mg total) by mouth daily. 90 tablet 1  . lisdexamfetamine (VYVANSE) 60 MG capsule Take 1 capsule (60 mg total) by mouth daily after breakfast. 30 capsule 0  . lisdexamfetamine (VYVANSE) 60 MG capsule Take 1 capsule (60 mg total) by mouth daily after breakfast. 30 capsule 0  . oseltamivir (TAMIFLU) 75 MG capsule TAKE 1 CAPSULE BY MOUTH 2 (TWO) TIMES DAILY 10 capsule 0   No facility-administered medications prior to visit.     Per HPI unless specifically indicated in ROS section below Review of Systems Objective:  BP (!) 148/96 (BP Location: Right Arm, Cuff Size: Normal)   Pulse 91   Temp 98 F (36.7 C) (Temporal)   Ht _0  (1.651 m)   Wt 150 lb 9.6 oz (68.3 kg)   SpO2 99%   BMI 25.06 kg/m   Wt Readings from Last 3 Encounters:  09/09/20 150 lb 9.6 oz (68.3 kg)  08/04/20 145 lb (65.8 kg)  02/08/20 146 lb 4 oz (66.3 kg)      Physical Exam Vitals and nursing note reviewed.  Constitutional:      Appearance: Normal appearance. She is not ill-appearing.  Eyes:     Extraocular Movements: Extraocular movements intact.     Pupils: Pupils are equal, round, and reactive to light.  Neck:     Thyroid: No thyroid mass or thyromegaly.  Cardiovascular:     Rate and Rhythm: Normal rate and regular rhythm.     Pulses: Normal pulses.     Heart sounds: Normal heart sounds. No murmur heard.   Pulmonary:     Effort: Pulmonary effort is normal. No respiratory distress.     Breath sounds: Normal breath sounds. No wheezing, rhonchi or rales.  Musculoskeletal:     Right lower leg: No edema.     Left lower leg: No edema.  Skin:    General: Skin is warm and dry.  Neurological:     Mental Status: She is alert.  Psychiatric:        Mood and Affect: Mood normal.        Behavior: Behavior normal.       Results  for orders placed or performed in visit on 09/09/20  Sedimentation rate  Result Value Ref Range   Sed Rate 14 0 - 20 mm/hr  RNP Antibody  Result Value Ref Range   Ribonucleic Protein(ENA) Antibody, IgG <1.0 NEG <1.0 NEG AI  HLA-B27 antigen  Result Value Ref Range   HLA-B27 Antigen POSITIVE (A) NEGATIVE  ANA  Result Value Ref Range   Anti Nuclear Antibody (ANA) NEGATIVE NEGATIVE  TSH  Result Value Ref Range   TSH 1.32 0.35 -  4.50 uIU/mL   SACROILIAC JOINTS - 1-2 VIEW COMPARISON:  None. FINDINGS: The sacroiliac joint spaces are maintained and there is no evidence of arthropathy. No other bone abnormalities are seen. IMPRESSION: Negative. Electronically Signed   By: Marijo Conception M.D.   On: 11/06/2019 14:28 Assessment & Plan:  This visit occurred during the SARS-CoV-2 public health emergency.  Safety protocols were in place, including screening questions prior to the visit, additional usage of staff PPE, and extensive cleaning of exam room while observing appropriate contact time as indicated for disinfecting solutions.   Problem List Items Addressed This Visit    Essential hypertension - Primary    Chronic, deteriorated.  Increase amlodipine to 66m daily.  Update with effect. See below for further plan.       Relevant Medications   amLODipine (NORVASC) 5 MG tablet   Other Relevant Orders   TSH (Completed)   Chronic migraine without aura with status migrainosus, not intractable    Ongoing migraines followed by neurology, managed effectively with nurtec abortively.  No longer on Ajovy due to rash every time she uses it.       Relevant Medications   amLODipine (NORVASC) 5 MG tablet   Raynaud's syndrome    Chronic, deteriorated. Increase amlodipine dose.       Relevant Medications   amLODipine (NORVASC) 5 MG tablet   Other Relevant Orders   Ambulatory referral to Rheumatology   Asthma    Continue PRN albuterol. Recent chest tightness feeling could be related to  worsening asthma - trial ICS - will send flovent to take daily, update with effect (Qvar not covered by insurance).  If no improvement with ICS, consider cardiology eval although reassuring stress echo from 2016 and recent ER labs.      Chronic right SI joint pain    Chronic issue. SI joint films from last year returned normal. Describes inflammatory arthritis symptoms - morning pain/stiffness that improves later in the day.  Check HLA B27, consider rheum eval.       Relevant Orders   Ambulatory referral to Rheumatology   Incomplete right bundle branch block    H/o this      Relevant Medications   amLODipine (NORVASC) 5 MG tablet   Polyarthralgia    Longstanding polyarthralgias worse in mornings - R SIJ pain, R hip pain, bilateral heel pain.  Check labwork for further eval (ESR, ANA, anti-RNP, HLA B27) and discussed possible rheum referral for further evaluation. Pt agrees with plan.       Relevant Orders   Ambulatory referral to Rheumatology    Other Visit Diagnoses    Morning joint stiffness of multiple sites       Relevant Orders   Ambulatory referral to Rheumatology   HLA B7 antigen positive       Relevant Orders   Ambulatory referral to Rheumatology       Meds ordered this encounter  Medications  . amLODipine (NORVASC) 5 MG tablet    Sig: Take 1 tablet (5 mg total) by mouth daily.    Dispense:  90 tablet    Refill:  3  . DISCONTD: beclomethasone (QVAR REDIHALER) 80 MCG/ACT inhaler    Sig: Inhale 1 puff into the lungs 2 (two) times daily.    Dispense:  10.6 g    Refill:  3   Orders Placed This Encounter  Procedures  . TSH  . Ambulatory referral to Rheumatology    Referral Priority:   Routine  Referral Type:   Consultation    Referral Reason:   Specialty Services Required    Requested Specialty:   Rheumatology    Number of Visits Requested:   1    Patient Instructions  Increase amlodipine to 48m daily - new dose at pharmacy.  Continue monitoring blood  pressures.  Trial inhaled corticosteroid Qvar for asthma - this may help chest tightness feeling. Try for a few weeks, if no improvement let me know for cardiology referral.  Keep me updated with how you're doing.  Return as needed or in 1 month for follow up visit.  Labs today  Follow up plan: Return if symptoms worsen or fail to improve.  JRia Bush MD

## 2020-09-09 NOTE — Patient Instructions (Addendum)
Increase amlodipine to 5mg  daily - new dose at pharmacy.  Continue monitoring blood pressures.  Trial inhaled corticosteroid Qvar for asthma - this may help chest tightness feeling. Try for a few weeks, if no improvement let me know for cardiology referral.  Keep me updated with how you're doing.  Return as needed or in 1 month for follow up visit.  Labs today

## 2020-09-12 ENCOUNTER — Other Ambulatory Visit: Payer: Self-pay

## 2020-09-12 DIAGNOSIS — M2550 Pain in unspecified joint: Secondary | ICD-10-CM | POA: Insufficient documentation

## 2020-09-12 LAB — ANA: Anti Nuclear Antibody (ANA): NEGATIVE

## 2020-09-12 LAB — RNP ANTIBODY: Ribonucleic Protein(ENA) Antibody, IgG: <1 AI

## 2020-09-12 LAB — HLA-B27 ANTIGEN: HLA-B27 Antigen: POSITIVE — AB

## 2020-09-12 NOTE — Assessment & Plan Note (Signed)
Chronic, deteriorated. Increase amlodipine dose.

## 2020-09-12 NOTE — Progress Notes (Incomplete)
Patient ID: Caitlin Burns, female    DOB: 12/24/1980, 40 y.o.   MRN: [id number]  This visit was conducted in person.  BP (!) 148/96 (BP Location: Right Arm, Cuff Size: Normal)   Pulse 91   Temp 98 F (36.7 C) (Temporal)   Ht _0  (1.651 m)   Wt 150 lb 9.6 oz (68.3 kg)   SpO2 99%   BMI 25.06 kg/m    CC: HTN f/u  Subjective:   HPI: Caitlin Burns is a 40 y.o. female presenting on 09/09/2020 for Hypertension   See recent mychart message and recent ER visit.  HTN - Compliant with current antihypertensive regimen of amlodipine 2.5 mg daily. Did not increase dose yet. Does check blood pressures at home: 150s/90-100. No low blood pressure readings or symptoms of dizziness/syncope. Denies vision changes, SOB, leg swelling. No changes to correlate with increase in BP. She avoids salt/sodium in diet.   Notes HA and blood pressure elevation triggered by prolonged sitting.   Chest discomfort described as substernal tightness pressure in chest, not exertional, not relieved by rest, associate with some dyspnea. Known asthma managed with PRN albuterol. She does feel better when she uses albuterol. No night time awakenings. Uses albuterol inhaler about 2x/wk. No recent asthma flare.   Chronic headaches (migraines sees neuro) - manages with nurtec with benefit - not on Ajovy as it causes rash each time she injects.   Notes increased dry mouth despite increasing water intake. No dry eyes. No new joint pains or skin rash. Chronic joint pains, worse in am.   Chronic R hip pain. Stays stiff in the mornings, this progressively improves as she is more active. Lateral feet hurt in the mornings.   Head CT and brain MRI at ER 07/2020 without acute findings.  Raynaud's flared this winter despite amlodipine.      Relevant past medical, surgical, family and social history reviewed and updated as indicated. Interim medical history since our last visit reviewed. Allergies and medications reviewed and  updated. Outpatient Medications Prior to Visit  Medication Sig Dispense Refill  . albuterol (VENTOLIN HFA) 108 (90 Base) MCG/ACT inhaler Inhale 1-2 puffs into the lungs every 6 (six) hours as needed for wheezing or shortness of breath. 18 g 3  . ALPRAZolam (XANAX) 0.5 MG tablet TAKE 1 TABLET BY MOUTH TWICE DAILY AS NEEDED FOR ANXIETY. 180 tablet 0  . azithromycin (ZITHROMAX) 250 MG tablet TAKE 2 TABLETS BY MOUTH ON DAY 1 THEN TAKE 1 TABLET DAILY FOR THE NEXT 4 DAYS (Patient taking differently: Take by mouth daily. for the next 4 days) 6 tablet 0  . buPROPion (WELLBUTRIN XL) 150 MG 24 hr tablet TAKE 1 TABLET BY MOUTH DAILY AFTER BREAKFAST 90 tablet 3  . cyclobenzaprine (FLEXERIL) 5 MG tablet Take 1-2 tablets (5-10 mg total) by mouth 2 (two) times daily as needed (migraine headache - sedation precautions). 30 tablet 1  . drospirenone-ethinyl estradiol (YAZ) 3-0.02 MG tablet TAKE 1 TABLET DAILY, CONTINUOUS ACTIVE PILLS 112 tablet 3  . drospirenone-ethinyl estradiol (YAZ,GIANVI,LORYNA) 3-0.02 MG tablet   11  . Fremanezumab-vfrm 225 MG/1.5ML SOAJ INJECT 225 MG INTO THE SKIN EVERY 30 (THIRTY) DAYS. 4.5 mL 3  . lisdexamfetamine (VYVANSE) 60 MG capsule TAKE 1 CAPSULE BY MOUTH DAILY. 90 capsule 0  . METRONIDAZOLE, TOPICAL, 0.75 % LOTN APPLY TO THE AFFECTED AREA(S) TO FACE ONCE OR TWICE DAILY AS DIRECTED 59 mL 2  . ondansetron (ZOFRAN) 4 MG tablet Take 1 tablet (4  mg total) by mouth every 8 (eight) hours as needed for nausea or vomiting. 10 tablet 0  . ondansetron (ZOFRAN-ODT) 4 MG disintegrating tablet Take 1 tablet (4 mg total) by mouth every 8 (eight) hours as needed for nausea. 30 tablet 3  . pantoprazole (PROTONIX) 40 MG tablet Take 40 mg by mouth daily.    . pantoprazole (PROTONIX) 40 MG tablet TAKE 1 TABLET BY MOUTH ONCE DAILY 30 tablet 7  . Rimegepant Sulfate 75 MG TBDP TAKE 1 TABLET BY MOUTH DAILY AS NEEDED FOR MIGRAINES. TAKE AS CLOSE TO ONSET OF MIGRAINE AS POSSIBLE. ONE DAILY MAXIMUM. 8 tablet 6   . scopolamine (TRANSDERM-SCOP, 1.5 MG,) 1 MG/3DAYS Place 1 patch (1.5 mg total) onto the skin every 3 (three) days. 4 patch 3  . amLODipine (NORVASC) 2.5 MG tablet Take 1 tablet (2.5 mg total) by mouth daily. 90 tablet 1  . lisdexamfetamine (VYVANSE) 60 MG capsule Take 1 capsule (60 mg total) by mouth daily after breakfast. 30 capsule 0  . lisdexamfetamine (VYVANSE) 60 MG capsule Take 1 capsule (60 mg total) by mouth daily after breakfast. 30 capsule 0  . oseltamivir (TAMIFLU) 75 MG capsule TAKE 1 CAPSULE BY MOUTH 2 (TWO) TIMES DAILY 10 capsule 0   No facility-administered medications prior to visit.     Per HPI unless specifically indicated in ROS section below Review of Systems Objective:  BP (!) 148/96 (BP Location: Right Arm, Cuff Size: Normal)   Pulse 91   Temp 98 F (36.7 C) (Temporal)   Ht _0  (1.651 m)   Wt 150 lb 9.6 oz (68.3 kg)   SpO2 99%   BMI 25.06 kg/m   Wt Readings from Last 3 Encounters:  09/09/20 150 lb 9.6 oz (68.3 kg)  08/04/20 145 lb (65.8 kg)  02/08/20 146 lb 4 oz (66.3 kg)      Physical Exam Vitals and nursing note reviewed.  Constitutional:      Appearance: Normal appearance. She is not ill-appearing.  Eyes:     Extraocular Movements: Extraocular movements intact.     Pupils: Pupils are equal, round, and reactive to light.  Neck:     Thyroid: No thyroid mass or thyromegaly.  Cardiovascular:     Rate and Rhythm: Normal rate and regular rhythm.     Pulses: Normal pulses.     Heart sounds: Normal heart sounds. No murmur heard.   Pulmonary:     Effort: Pulmonary effort is normal. No respiratory distress.     Breath sounds: Normal breath sounds. No wheezing, rhonchi or rales.  Musculoskeletal:     Right lower leg: No edema.     Left lower leg: No edema.  Skin:    General: Skin is warm and dry.  Neurological:     Mental Status: She is alert.  Psychiatric:        Mood and Affect: Mood normal.        Behavior: Behavior normal.       Results  for orders placed or performed in visit on 09/09/20  Sedimentation rate  Result Value Ref Range   Sed Rate 14 0 - 20 mm/hr  RNP Antibody  Result Value Ref Range   Ribonucleic Protein(ENA) Antibody, IgG <1.0 NEG <1.0 NEG AI  HLA-B27 antigen  Result Value Ref Range   HLA-B27 Antigen POSITIVE (A) NEGATIVE  ANA  Result Value Ref Range   Anti Nuclear Antibody (ANA) NEGATIVE NEGATIVE  TSH  Result Value Ref Range   TSH 1.32  0.35 - 4.50 uIU/mL   SACROILIAC JOINTS - 1-2 VIEW COMPARISON:  None. FINDINGS: The sacroiliac joint spaces are maintained and there is no evidence of arthropathy. No other bone abnormalities are seen. IMPRESSION: Negative. Electronically Signed   By: Marijo Conception M.D.   On: 11/06/2019 14:28 Assessment & Plan:  This visit occurred during the SARS-CoV-2 public health emergency.  Safety protocols were in place, including screening questions prior to the visit, additional usage of staff PPE, and extensive cleaning of exam room while observing appropriate contact time as indicated for disinfecting solutions.   Problem List Items Addressed This Visit    Essential hypertension - Primary    Chronic, deteriorated.  Increase amlodipine to 32m daily.  Update with effect. See below for further plan.       Relevant Medications   amLODipine (NORVASC) 5 MG tablet   Other Relevant Orders   TSH (Completed)   Chronic migraine without aura with status migrainosus, not intractable    Ongoing migraines followed by neurology, managed effectively with nurtec abortively.  No longer on Ajovy due to rash every time she uses it.       Relevant Medications   amLODipine (NORVASC) 5 MG tablet   Raynaud's syndrome    Chronic, deteriorated. Increase amlodipine dose.       Relevant Medications   amLODipine (NORVASC) 5 MG tablet   Other Relevant Orders   Ambulatory referral to Rheumatology   Asthma    Continue PRN albuterol. Recent chest tightness feeling could be related to  worsening asthma - trial ICS - will send flovent to take daily, update with effect.       Chronic right SI joint pain    Chronic issue. SI joint films from last year returned normal. Describes inflammatory arthritis symptoms - morning pain/stiffness that improves later in the day.  Check HLA B27, consider rheum eval.       Relevant Orders   Ambulatory referral to Rheumatology   Incomplete right bundle branch block    Chronic       Relevant Medications   amLODipine (NORVASC) 5 MG tablet    Other Visit Diagnoses    Polyarthralgia       Relevant Orders   Ambulatory referral to Rheumatology   Morning joint stiffness of multiple sites       Relevant Orders   Ambulatory referral to Rheumatology   HLA B7 antigen positive       Relevant Orders   Ambulatory referral to Rheumatology       Meds ordered this encounter  Medications  . amLODipine (NORVASC) 5 MG tablet    Sig: Take 1 tablet (5 mg total) by mouth daily.    Dispense:  90 tablet    Refill:  3  . DISCONTD: beclomethasone (QVAR REDIHALER) 80 MCG/ACT inhaler    Sig: Inhale 1 puff into the lungs 2 (two) times daily.    Dispense:  10.6 g    Refill:  3   Orders Placed This Encounter  Procedures  . TSH  . Ambulatory referral to Rheumatology    Referral Priority:   Routine    Referral Type:   Consultation    Referral Reason:   Specialty Services Required    Requested Specialty:   Rheumatology    Number of Visits Requested:   1    Patient Instructions  Increase amlodipine to 558mdaily - new dose at pharmacy.  Continue monitoring blood pressures.  Trial inhaled corticosteroid Qvar for asthma -  this may help chest tightness feeling. Try for a few weeks, if no improvement let me know for cardiology referral.  Keep me updated with how you're doing.  Return as needed or in 1 month for follow up visit.  Labs today  Follow up plan: Return if symptoms worsen or fail to improve.  Ria Bush, MD

## 2020-09-12 NOTE — Assessment & Plan Note (Signed)
Chronic issue. SI joint films from last year returned normal. Describes inflammatory arthritis symptoms - morning pain/stiffness that improves later in the day.  Check HLA B27, consider rheum eval.

## 2020-09-12 NOTE — Assessment & Plan Note (Addendum)
Continue PRN albuterol. Recent chest tightness feeling could be related to worsening asthma - trial ICS - will send flovent to take daily, update with effect (Qvar not covered by insurance).  If no improvement with ICS, consider cardiology eval although reassuring stress echo from 2016 and recent ER labs.

## 2020-09-12 NOTE — Assessment & Plan Note (Signed)
Ongoing migraines followed by neurology, managed effectively with nurtec abortively.  No longer on Ajovy due to rash every time she uses it.

## 2020-09-13 NOTE — Assessment & Plan Note (Signed)
Longstanding polyarthralgias worse in mornings - R SIJ pain, R hip pain, bilateral heel pain.  Check labwork for further eval (ESR, ANA, anti-RNP, HLA B27) and discussed possible rheum referral for further evaluation. Pt agrees with plan.

## 2020-09-14 ENCOUNTER — Other Ambulatory Visit: Payer: Self-pay | Admitting: Obstetrics and Gynecology

## 2020-09-14 ENCOUNTER — Other Ambulatory Visit: Payer: Self-pay

## 2020-09-14 MED ORDER — DROSPIRENONE-ETHINYL ESTRADIOL 3-0.02 MG PO TABS
ORAL_TABLET | ORAL | 0 refills | Status: DC
  Filled 2020-09-14: qty 112, 84d supply, fill #0

## 2020-09-14 MED ORDER — PANTOPRAZOLE SODIUM 40 MG PO TBEC
DELAYED_RELEASE_TABLET | Freq: Every day | ORAL | 7 refills | Status: DC
  Filled 2020-09-14: qty 90, 90d supply, fill #0
  Filled 2020-12-27: qty 90, 90d supply, fill #1
  Filled 2021-03-28: qty 60, 60d supply, fill #2

## 2020-09-15 ENCOUNTER — Other Ambulatory Visit: Payer: Self-pay

## 2020-09-15 NOTE — Telephone Encounter (Signed)
ERROR

## 2020-09-19 ENCOUNTER — Encounter: Payer: Self-pay | Admitting: Family Medicine

## 2020-09-19 ENCOUNTER — Other Ambulatory Visit: Payer: Self-pay

## 2020-09-23 ENCOUNTER — Other Ambulatory Visit: Payer: Self-pay

## 2020-09-23 MED FILL — Rimegepant Sulfate Tab Disint 75 MG: ORAL | 15 days supply | Qty: 8 | Fill #0 | Status: AC

## 2020-09-23 MED FILL — Bupropion HCl Tab ER 24HR 150 MG: ORAL | 90 days supply | Qty: 90 | Fill #0 | Status: AC

## 2020-09-27 ENCOUNTER — Other Ambulatory Visit: Payer: Self-pay

## 2020-10-12 ENCOUNTER — Other Ambulatory Visit: Payer: Self-pay | Admitting: Adult Health

## 2020-10-12 ENCOUNTER — Other Ambulatory Visit: Payer: Self-pay

## 2020-10-12 DIAGNOSIS — F902 Attention-deficit hyperactivity disorder, combined type: Secondary | ICD-10-CM

## 2020-10-13 ENCOUNTER — Other Ambulatory Visit: Payer: Self-pay

## 2020-10-13 MED FILL — Lisdexamfetamine Dimesylate Cap 60 MG: ORAL | 90 days supply | Qty: 90 | Fill #0 | Status: AC

## 2020-10-13 NOTE — Telephone Encounter (Signed)
Last filled 07/08/20

## 2020-11-02 ENCOUNTER — Other Ambulatory Visit: Payer: Self-pay | Admitting: Adult Health

## 2020-11-02 DIAGNOSIS — F410 Panic disorder [episodic paroxysmal anxiety] without agoraphobia: Secondary | ICD-10-CM

## 2020-11-02 DIAGNOSIS — F411 Generalized anxiety disorder: Secondary | ICD-10-CM

## 2020-11-04 ENCOUNTER — Other Ambulatory Visit: Payer: Self-pay

## 2020-11-04 NOTE — Telephone Encounter (Signed)
Next apt 09/06 please send if appropriate

## 2020-11-05 ENCOUNTER — Telehealth: Payer: 59 | Admitting: Nurse Practitioner

## 2020-11-05 DIAGNOSIS — H1032 Unspecified acute conjunctivitis, left eye: Secondary | ICD-10-CM

## 2020-11-05 MED ORDER — POLYMYXIN B-TRIMETHOPRIM 10000-0.1 UNIT/ML-% OP SOLN: 1.0000 [drp] | Freq: Four times a day (QID) | OPHTHALMIC | 0 refills | Status: AC

## 2020-11-05 NOTE — Progress Notes (Signed)
E-Visit for [name]   We are sorry that you are not feeling well.  Here is how we plan to help!  Based on what you have shared with me it looks like you have conjunctivitis.  Conjunctivitis is a common inflammatory or infectious condition of the eye that is often referred to as "pink eye".  In most cases it is contagious (viral or bacterial). However, not all conjunctivitis requires antibiotics (ex. Allergic).  We have made appropriate suggestions for you based upon your presentation.  I have prescribed Polytrim Ophthalmic drops 1-2 drops 4 times a day times 5 days  Pink eye can be highly contagious.  It is typically spread through direct contact with secretions, or contaminated objects or surfaces that one may have touched.  Strict handwashing is suggested with soap and water is urged.  If not available, use alcohol based had sanitizer.  Avoid unnecessary touching of the eye.  If you wear contact lenses, you will need to refrain from wearing them until you see no white discharge from the eye for at least 24 hours after being on medication.  You should see symptom improvement in 1-2 days after starting the medication regimen.  Call us if symptoms are not improved in 1-2 days.  Home Care: Wash your hands often! Do not wear your contacts until you complete your treatment plan. Avoid sharing towels, bed linen, personal items with a person who has pink eye. See attention for anyone in your home with similar symptoms.  Get Help Right Away If: Your symptoms do not improve. You develop blurred or loss of vision. Your symptoms worsen (increased discharge, pain or redness)  Meds ordered this encounter  Medications   trimethoprim-polymyxin b (POLYTRIM) ophthalmic solution    Sig: Place 1 drop into the left eye every 6 (six) hours for 5 days.    Dispense:  10 mL    Refill:  0    Thank you for choosing an e-visit.  I spent approximately 5 minutes reviewing the patient's history symptoms and  coordinating her care today.   Your e-visit answers were reviewed by a board certified advanced clinical practitioner to complete your personal care plan. Depending upon the condition, your plan could have included both over the counter or prescription medications.  Please review your pharmacy choice. Make sure the pharmacy is open so you can pick up prescription now. If there is a problem, you may contact your provider through [employer] and have the prescription routed to another pharmacy.  Your safety is important to [place]. If you have drug allergies check your prescription carefully.   For the next 24 hours you can use MyChart to ask questions about today's visit, request a non-urgent call back, or ask for a work or school excuse. You will get an email in the next two days asking about your experience. I hope that your e-visit has been valuable and will speed your recovery.

## 2020-11-08 ENCOUNTER — Other Ambulatory Visit: Payer: Self-pay

## 2020-11-08 DIAGNOSIS — Z111 Encounter for screening for respiratory tuberculosis: Secondary | ICD-10-CM | POA: Diagnosis not present

## 2020-11-08 DIAGNOSIS — M458 Ankylosing spondylitis sacral and sacrococcygeal region: Secondary | ICD-10-CM | POA: Diagnosis not present

## 2020-11-08 DIAGNOSIS — I7300 Raynaud's syndrome without gangrene: Secondary | ICD-10-CM | POA: Diagnosis not present

## 2020-11-08 DIAGNOSIS — Z79899 Other long term (current) drug therapy: Secondary | ICD-10-CM | POA: Diagnosis not present

## 2020-11-08 MED ORDER — ALPRAZOLAM 0.5 MG PO TABS: ORAL_TABLET | Freq: Two times a day (BID) | ORAL | 0 refills | Status: DC | PRN

## 2020-11-09 ENCOUNTER — Other Ambulatory Visit: Payer: Self-pay

## 2020-11-09 DIAGNOSIS — F411 Generalized anxiety disorder: Secondary | ICD-10-CM

## 2020-11-09 DIAGNOSIS — F410 Panic disorder [episodic paroxysmal anxiety] without agoraphobia: Secondary | ICD-10-CM

## 2020-11-09 MED ORDER — ALPRAZOLAM 0.5 MG PO TABS
ORAL_TABLET | Freq: Two times a day (BID) | ORAL | 0 refills | Status: DC | PRN
Start: 2020-11-09 — End: 2021-01-10
  Filled 2020-11-09: qty 180, 90d supply, fill #0

## 2020-11-10 ENCOUNTER — Other Ambulatory Visit: Payer: Self-pay

## 2020-11-21 ENCOUNTER — Telehealth: Payer: 59 | Admitting: Nurse Practitioner

## 2020-11-21 ENCOUNTER — Encounter: Payer: Self-pay | Admitting: Family Medicine

## 2020-11-21 ENCOUNTER — Other Ambulatory Visit: Payer: Self-pay

## 2020-11-21 DIAGNOSIS — U071 COVID-19: Secondary | ICD-10-CM

## 2020-11-21 DIAGNOSIS — Z309 Encounter for contraceptive management, unspecified: Secondary | ICD-10-CM | POA: Diagnosis not present

## 2020-11-21 MED ORDER — MOLNUPIRAVIR EUA 200MG CAPSULE
4.0000 | ORAL_CAPSULE | Freq: Two times a day (BID) | ORAL | 0 refills | Status: AC
Start: 2020-11-21 — End: 2020-11-26
  Filled 2020-11-21: qty 40, 5d supply, fill #0

## 2020-11-21 MED ORDER — ALBUTEROL SULFATE HFA 108 (90 BASE) MCG/ACT IN AERS
2.0000 | INHALATION_SPRAY | Freq: Four times a day (QID) | RESPIRATORY_TRACT | 0 refills | Status: DC | PRN
  Filled 2020-11-21: qty 8.5, 25d supply, fill #0

## 2020-11-21 MED ORDER — DROSPIRENONE-ETHINYL ESTRADIOL 3-0.02 MG PO TABS
ORAL_TABLET | ORAL | 0 refills | Status: DC
  Filled 2020-11-21: qty 112, 84d supply, fill #0

## 2020-11-21 NOTE — Telephone Encounter (Signed)
Where do you want to have pt added for MyChart video visit?

## 2020-11-21 NOTE — Telephone Encounter (Addendum)
I spoke with pt and she said she had already done an E visit with [facility] and pt was given an antiviral med and also advised to continue inhaler. Pt said her breathing is not bad right now, recently used inhaler. Pt said she would cb if needed. Sending note to Dr Darnell Level and Lattie Haw CMA.

## 2020-11-21 NOTE — Progress Notes (Signed)
Virtual Visit Consent   Leatrice Jewels, you are scheduled for a virtual visit with a [facility] provider today.     Just as with appointments in the office, your consent must be obtained to participate.  Your consent will be active for this visit and any virtual visit you may have with one of our providers in the next 365 days.     If you have a MyChart account, a copy of this consent can be sent to you electronically.  All virtual visits are billed to your insurance company just like a traditional visit in the office.    As this is a virtual visit, video technology does not allow for your provider to perform a traditional examination.  This may limit your provider's ability to fully assess your condition.  If your provider identifies any concerns that need to be evaluated in person or the need to arrange testing (such as labs, EKG, etc.), we will make arrangements to do so.     Although advances in technology are sophisticated, we cannot ensure that it will always work on either your end or our end.  If the connection with a video visit is poor, the visit may have to be switched to a telephone visit.  With either a video or telephone visit, we are not always able to ensure that we have a secure connection.     I need to obtain your verbal consent now.   Are you willing to proceed with your visit today?    Caitlin Burns has provided verbal consent on 11/21/2020 for a virtual visit (video or telephone).   Apolonio Schneiders, FNP   Date: 11/21/2020 1:05 PM   Virtual Visit via Video Note   I, Apolonio Schneiders, connected with  Caitlin Burns  ([id number], [birth year]) on 11/21/20 at  1:15 PM EDT by a video-enabled telemedicine application and verified that I am speaking with the correct person using two identifiers.  Location: Patient: Virtual Visit Location Patient: Home Provider: Virtual Visit Location Provider: Office/Clinic   I discussed the limitations of evaluation and management by  telemedicine and the availability of in person appointments. The patient expressed understanding and agreed to proceed.    History of Present Illness: Caitlin Burns is a 40 y.o. who identifies as a female who was assigned female at birth, and is being seen today after testing positive for COVID-19, her symptoms started yesterday and she tested positive today.   Last night she had body aches and fever today she is having chest tightness and some wheezing.  She took benadryl last night and started to use her albuterol inhaler.  Today she has used her Albuterol inhaler one time since 10am, she did have slight improvement with inhaler use.   She recently returned from a cruise to the [place].   She has been vaccinated with 2 doses of Pfizer so far.  She has not had COVID prior to this.   Her albuterol inhaler is prescribed for asthma, she uses this as needed. 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   Health maintenance examination 10/06/2010   Asthma 10/06/2010   ECHOCARDIOGRAM, ABNORMAL 04/17/2010   Essential hypertension 03/24/2010   Palpitations 03/24/2010   Raynaud's syndrome 03/23/2010   Chronic migraine without aura with status migrainosus, not intractable 07/14/2007    Allergies:  Allergies  Allergen Reactions   Advil [Ibuprofen] Other (See  Comments)    Stroke like ibuprofen   Aspirin Hives and Rash   Sulfonamide Derivatives Hives and Rash   Current Outpatient Medications  Medication Instructions   albuterol (VENTOLIN HFA) 108 (90 Base) MCG/ACT inhaler 2 puffs, Inhalation, Every 6 hours PRN   ALPRAZolam (XANAX) 0.5 MG tablet Oral, 2 times daily PRN   amLODipine (NORVASC) 5 mg, Oral, Daily   buPROPion (WELLBUTRIN XL) 150 MG 24 hr tablet TAKE 1 TABLET BY MOUTH DAILY AFTER BREAKFAST   cyclobenzaprine (FLEXERIL) 5-10 mg, Oral, 2 times daily PRN   drospirenone-ethinyl estradiol (YAZ) 3-0.02 MG tablet TAKE 1 TABLET by mouth DAILY, CONTINUOUS ACTIVE PILLS   fluticasone (FLOVENT HFA) 110 MCG/ACT inhaler 1 puff, Inhalation, 2 times daily   Fremanezumab-vfrm 225 MG/1.5ML SOAJ INJECT 225 MG INTO THE SKIN EVERY 30 (THIRTY) DAYS.   Lagevrio 800 mg, Oral, 2 times daily   lisdexamfetamine (VYVANSE) 60 MG capsule TAKE 1 CAPSULE BY MOUTH DAILY.   METRONIDAZOLE, TOPICAL, 0.75 % LOTN APPLY TO THE AFFECTED AREA(S) TO FACE ONCE OR TWICE DAILY AS DIRECTED   pantoprazole (PROTONIX) 40 MG tablet TAKE 1 TABLET BY MOUTH ONCE DAILY   Rimegepant Sulfate 75 MG TBDP TAKE 1 TABLET BY MOUTH DAILY AS NEEDED FOR MIGRAINES. TAKE AS CLOSE TO ONSET OF MIGRAINE AS POSSIBLE. ONE DAILY MAXIMUM.     Observations/Objective: Patient is well-developed, well-nourished in no acute distress.  Resting comfortably at home.  Head is normocephalic, atraumatic.  No labored breathing.  Speech is clear and coherent with logical content.  Patient is alert and oriented at baseline.    Assessment and Plan: 1. COVID-19  - molnupiravir EUA 200 mg CAPS; Take 4 capsules (800 mg total) by mouth 2 (two) times daily for 5 days.  Dispense: 40 capsule; Refill: 0 - albuterol (VENTOLIN HFA) 108 (90 Base) MCG/ACT inhaler; Inhale 2 puffs into the lungs every 6 (six) hours as needed for wheezing or shortness of breath.  Dispense: 8.5 g; Refill: 0  2. Encounter for contraceptive  management, unspecified type One time refill provided for patient since she is interested in taking Molnuporavir and this is contraindicated with pregnancy will provider ongoing OCP coverage for patient, she will follow up with PCP or OBGYN for future refill.s  - drospirenone-ethinyl estradiol (YAZ) 3-0.02 MG tablet; TAKE 1 TABLET by mouth DAILY, CONTINUOUS ACTIVE PILLS  Dispense: 112 tablet; Refill: 0     Follow Up Instructions: I discussed the assessment and treatment plan with the patient. The patient was provided an opportunity to ask questions and all were answered. The patient agreed with the plan and demonstrated an understanding of the instructions.  A copy of instructions were sent to the patient via MyChart.  The patient was advised to call back or seek an in-person evaluation if the symptoms worsen or if the condition fails to improve as anticipated.  Time:  I spent 20 minutes with the patient via telehealth technology discussing the above problems/concerns.  Apolonio Schneiders, FNP

## 2020-11-21 NOTE — Telephone Encounter (Signed)
Plz triage pt.  °

## 2020-11-21 NOTE — Telephone Encounter (Signed)
Noted  

## 2020-11-21 NOTE — Patient Instructions (Signed)
You are being prescribed MOLNUPIRAVIR for COVID-19 infection.    Please pick up your prescription at: [facility]  Address:  [facility], [address] [place], [place] [address]      Hours:  Monday - Friday 7:30 am - 5:30 pm.  Saturday Closed   Sunday Closed  Phone: [phone]     Please call the pharmacy or go through the drive through vs going inside if you are picking up the mediation yourself to prevent further spread. If prescribed to a [facility] affiliated pharmacy, a pharmacist will bring the medication out to your car.   ADMINISTRATION INSTRUCTIONS: Take with or without food. Swallow the tablets whole. Don't chew, crush, or break the medications because it might not work as well  For each dose of the medication, you should be taking FOUR tablets at one time, TWICE a day   Finish your full five-day course of Molnupiravir even if you feel better before you're done. Stopping this medication too early can make it less effective to prevent severe illness related to [place].    Molnupiravir is prescribed for YOU ONLY. Don't share it with others, even if they have similar symptoms as you. This medication might not be right for everyone.   Make sure to take steps to protect yourself and others while you're taking this medication in order to get well soon and to prevent others from getting sick with COVID-19.   **If you are of childbearing potential (any gender) - it is advised to not get pregnant while taking this medication and recommended that condoms are used for female partners the next 3 months after taking the medication out of extreme caution    COMMON SIDE EFFECTS: Diarrhea Nausea  Dizziness    If your COVID-19 symptoms get worse, get medical help right away. Call 911 if you experience symptoms such as worsening cough, trouble breathing, chest pain that doesn't go away, confusion, a hard time staying awake, and pale or blue-colored skin. This  medication won't prevent all COVID-19 cases from getting worse.

## 2020-11-22 NOTE — Telephone Encounter (Signed)
Noted  

## 2020-11-22 NOTE — Telephone Encounter (Signed)
Treated with molnupiravir. Plz call tomorrow for update on symptoms in asthmatic.

## 2020-11-23 ENCOUNTER — Telehealth: Payer: 59 | Admitting: Physician Assistant

## 2020-11-23 ENCOUNTER — Encounter: Payer: Self-pay | Admitting: Nurse Practitioner

## 2020-11-23 DIAGNOSIS — R079 Chest pain, unspecified: Secondary | ICD-10-CM

## 2020-11-23 NOTE — Progress Notes (Signed)
Based on what you shared with me, I feel your condition warrants further evaluation and I recommend that you be seen in a face to face visit.  Giving significance of the shortness of breath, with chest pain from COVID, you need in person evaluation to rule out more concerning issues secondary to the [place].    NOTE: There will be NO CHARGE for this eVisit   If you are having a true medical emergency please call 911.      For an urgent face to face visit, [facility] has six urgent care centers for your convenience:     [facility] [place] [facility] [place] Get Driving Directions [phone] [place], [place] [address]    [facility]) Get Driving Directions [phone] [place], [place] [address]  [facility] [place] ([place]) Get Driving Directions [phone] [address] [place],  [place]  [address]  [facility] Get Driving Directions [phone] [place], [place], [place] [address]   [facility] Get Driving Directions  [phone] [address] [place], [place] [address]   [facility] [place] Get Driving Directions [phone] [address] [place], [place] [address]  Your MyChart E-visit questionnaire answers were reviewed by a board certified advanced clinical practitioner to complete your personal care plan based on your specific symptoms.  Thank you for using e-Visits.

## 2020-11-23 NOTE — Telephone Encounter (Signed)
Spoke with pt asking for update on sxs.  States she's still having trouble breathing, worse at night, sometimes when walking/talking. And SpO2 drops to 92%-93%.  Also, c/o sharp pain in L lung.  She is using albuterol q6h, Flovent and still taking molnupiravir.

## 2020-11-24 ENCOUNTER — Encounter: Payer: Self-pay | Admitting: Nurse Practitioner

## 2020-11-24 ENCOUNTER — Telehealth: Payer: Self-pay | Admitting: Nurse Practitioner

## 2020-11-24 NOTE — Telephone Encounter (Signed)
Left message for patient and sent Mychart note-

## 2020-11-25 NOTE — Telephone Encounter (Signed)
Spoke with pt asking for update.  Pt states she is doing much better.  Breathing is good and no more lung pain.

## 2020-11-25 NOTE — Telephone Encounter (Signed)
Plz touch base again on Friday.  First day of symptoms was 11/20/2020

## 2020-11-30 ENCOUNTER — Other Ambulatory Visit: Payer: Self-pay

## 2020-11-30 MED FILL — Rimegepant Sulfate Tab Disint 75 MG: ORAL | 15 days supply | Qty: 8 | Fill #1 | Status: AC

## 2020-12-20 ENCOUNTER — Other Ambulatory Visit (HOSPITAL_COMMUNITY): Payer: Self-pay

## 2020-12-20 MED ORDER — HUMIRA PEN 40 MG/0.8ML ~~LOC~~ PNKT
PEN_INJECTOR | SUBCUTANEOUS | 5 refills | Status: DC
  Filled 2020-12-20: qty 2, 28d supply, fill #0

## 2020-12-21 ENCOUNTER — Other Ambulatory Visit (HOSPITAL_COMMUNITY): Payer: Self-pay

## 2020-12-21 ENCOUNTER — Ambulatory Visit: Payer: 59 | Attending: Family Medicine | Admitting: Pharmacist

## 2020-12-21 ENCOUNTER — Other Ambulatory Visit: Payer: Self-pay

## 2020-12-21 DIAGNOSIS — Z79899 Other long term (current) drug therapy: Secondary | ICD-10-CM

## 2020-12-21 MED ORDER — HUMIRA PEN 40 MG/0.8ML ~~LOC~~ PNKT
PEN_INJECTOR | SUBCUTANEOUS | 5 refills | Status: DC
  Filled 2020-12-21: qty 2, fill #0
  Filled 2020-12-30: qty 2, 28d supply, fill #0
  Filled 2021-01-20: qty 2, 28d supply, fill #1
  Filled 2021-02-23: qty 2, 28d supply, fill #2
  Filled 2021-03-24: qty 2, 28d supply, fill #3
  Filled 2021-04-26: qty 2, 28d supply, fill #4
  Filled 2021-06-05: qty 2, 28d supply, fill #5

## 2020-12-21 MED ORDER — HUMIRA PEN 40 MG/0.8ML ~~LOC~~ PNKT: PEN_INJECTOR | SUBCUTANEOUS | 5 refills | Status: DC

## 2020-12-21 NOTE — Progress Notes (Signed)
  S: Patient presents for review of their specialty medication therapy.  Patient is about to start Humira for ankylosing spondylitis. Patient is managed by Dr. Posey Pronto for this.   Adherence: has not yet started   Efficacy: has not yet started   Dosing:  Ankylosing spondylitis: SubQ: 40 mg every other week (may continue methotrexate, other nonbiologic DMARDS, corticosteroids, NSAIDs and/or analgesics)  Dose adjustments: Renal: no dose adjustments (has not been studied) Hepatic: no dose adjustments (has not been studied)  Drug-drug interactions: none identified  Screening: TB test: negative per Care Everywhere on 11/08/2020 Hepatitis: negative per Care Everywhere on 11/08/2020  Monitoring: S/sx of infection: has not started. Counseling given. CBC: S/sx of hypersensitivity: has not started. Counseling given. S/sx of malignancy: has not started. Counseling given. S/sx of heart failure: has not started. Counseling given.  Other side effects: has not started. Counseling given. Of note, she does have a latex allergy and has questions regarding this.   O:     Lab Results  Component Value Date   WBC 7.3 08/04/2020   HGB 14.0 08/04/2020   HCT 38.3 08/04/2020   MCV 83.4 08/04/2020   PLT 346 08/04/2020      Chemistry      Component Value Date/Time   NA 136 08/04/2020 1158   NA 140 09/28/2019 1610   K 3.9 08/04/2020 1158   CL 102 08/04/2020 1158   CO2 25 08/04/2020 1158   BUN 11 08/04/2020 1158   BUN 13 09/28/2019 1610   CREATININE 0.76 08/04/2020 1158      Component Value Date/Time   CALCIUM 9.3 08/04/2020 1158   ALKPHOS 45 08/04/2020 1158   AST 12 (L) 08/04/2020 1158   ALT 6 08/04/2020 1158   BILITOT 0.5 08/04/2020 1158   BILITOT 0.2 09/28/2019 1610       A/P: 1. Medication review: Patient about to start Humira for Ankylosing Spondylitis. Reviewed the medication with the patient, including the following: Humira is a TNF blocking agent indicated for ankylosing  spondylitis, Crohn's disease, Hidradenitis suppurativa, psoriatic arthritis, plaque psoriasis, ulcerative colitis, and uveitis. Patient educated on purpose, proper use and potential adverse effects of Humira. Possible adverse effects are increased risk of infections, headache, and injection site reactions. There is the possibility of an increased risk of malignancy but it is not well understood if this increased risk is due to there medication or the disease state. There are rare cases of pancytopenia and aplastic anemia. For SubQ injection at separate sites in the thigh or lower abdomen (avoiding areas within 2 inches of navel); rotate injection sites. May leave at room temperature for ~15 to 30 minutes prior to use; do not remove cap or cover while allowing product to reach room temperature. Do not use if solution is discolored or contains particulate matter. Do not administer to skin which is red, tender, bruised, hard, or that has scars, stretch marks, or psoriasis plaques. Needle cap of the prefilled syringe or needle cover for the adalimumab pen may contain latex - will default to her specialist. Prefilled pens and syringes are available for use by patients and the full amount of the syringe should be injected (self-administration); the vial is intended for institutional use only. Vials do not contain a preservative; discard unused portion. No recommendations for any changes at this time.   Benard Halsted, PharmD, [place], [place] [phone]

## 2020-12-22 ENCOUNTER — Other Ambulatory Visit: Payer: Self-pay

## 2020-12-22 ENCOUNTER — Other Ambulatory Visit: Payer: Self-pay | Admitting: Pharmacist

## 2020-12-22 ENCOUNTER — Other Ambulatory Visit (HOSPITAL_COMMUNITY): Payer: Self-pay

## 2020-12-22 MED ORDER — HUMIRA (2 PEN) 40 MG/0.4ML ~~LOC~~ AJKT: AUTO-INJECTOR | SUBCUTANEOUS | 5 refills | Status: DC

## 2020-12-22 MED ORDER — HUMIRA (2 PEN) 40 MG/0.4ML ~~LOC~~ AJKT
AUTO-INJECTOR | SUBCUTANEOUS | 5 refills | Status: DC
  Filled 2020-12-22: qty 2, fill #0
  Filled 2020-12-27: qty 2, 28d supply, fill #0

## 2020-12-27 ENCOUNTER — Other Ambulatory Visit: Payer: Self-pay

## 2020-12-27 ENCOUNTER — Other Ambulatory Visit (HOSPITAL_COMMUNITY): Payer: Self-pay

## 2020-12-27 MED FILL — Bupropion HCl Tab ER 24HR 150 MG: ORAL | 90 days supply | Qty: 90 | Fill #1 | Status: AC

## 2020-12-28 ENCOUNTER — Other Ambulatory Visit: Payer: Self-pay

## 2020-12-28 DIAGNOSIS — Z113 Encounter for screening for infections with a predominantly sexual mode of transmission: Secondary | ICD-10-CM | POA: Diagnosis not present

## 2020-12-28 DIAGNOSIS — Z124 Encounter for screening for malignant neoplasm of cervix: Secondary | ICD-10-CM | POA: Diagnosis not present

## 2020-12-28 DIAGNOSIS — Z01411 Encounter for gynecological examination (general) (routine) with abnormal findings: Secondary | ICD-10-CM | POA: Diagnosis not present

## 2020-12-28 DIAGNOSIS — I10 Essential (primary) hypertension: Secondary | ICD-10-CM | POA: Diagnosis not present

## 2020-12-28 DIAGNOSIS — Z01419 Encounter for gynecological examination (general) (routine) without abnormal findings: Secondary | ICD-10-CM | POA: Diagnosis not present

## 2020-12-28 DIAGNOSIS — Z3041 Encounter for surveillance of contraceptive pills: Secondary | ICD-10-CM | POA: Diagnosis not present

## 2020-12-28 DIAGNOSIS — Z6823 Body mass index (BMI) 23.0-23.9, adult: Secondary | ICD-10-CM | POA: Diagnosis not present

## 2020-12-28 MED ORDER — DROSPIRENONE-ETHINYL ESTRADIOL 3-0.02 MG PO TABS
ORAL_TABLET | ORAL | 3 refills | Status: DC
  Filled 2020-12-28 – 2021-02-27 (×2): qty 112, 84d supply, fill #0
  Filled 2021-06-08: qty 112, 84d supply, fill #1
  Filled 2021-09-13: qty 112, 84d supply, fill #2
  Filled 2021-12-17: qty 112, 84d supply, fill #3

## 2020-12-29 ENCOUNTER — Encounter: Payer: Self-pay | Admitting: Family Medicine

## 2020-12-29 ENCOUNTER — Other Ambulatory Visit: Payer: Self-pay | Admitting: Family Medicine

## 2020-12-29 DIAGNOSIS — Z1159 Encounter for screening for other viral diseases: Secondary | ICD-10-CM

## 2020-12-29 DIAGNOSIS — I10 Essential (primary) hypertension: Secondary | ICD-10-CM

## 2020-12-29 DIAGNOSIS — M2550 Pain in unspecified joint: Secondary | ICD-10-CM

## 2020-12-29 NOTE — Addendum Note (Signed)
Addended by: Ria Bush on: 12/29/2020 10:25 AM   Modules accepted: Orders

## 2020-12-30 ENCOUNTER — Other Ambulatory Visit: Payer: Self-pay

## 2020-12-30 ENCOUNTER — Other Ambulatory Visit (HOSPITAL_COMMUNITY): Payer: Self-pay

## 2020-12-30 ENCOUNTER — Other Ambulatory Visit (INDEPENDENT_AMBULATORY_CARE_PROVIDER_SITE_OTHER): Payer: 59

## 2020-12-30 DIAGNOSIS — I10 Essential (primary) hypertension: Secondary | ICD-10-CM | POA: Diagnosis not present

## 2020-12-30 LAB — MICROALBUMIN / CREATININE URINE RATIO
Creatinine,U: 181.8 mg/dL
Microalb Creat Ratio: 0.9 mg/g (ref 0.0–30.0)
Microalb, Ur: 1.6 mg/dL (ref 0.0–1.9)

## 2020-12-30 LAB — COMPREHENSIVE METABOLIC PANEL
ALT: 4 U/L (ref 0–35)
AST: 10 U/L (ref 0–37)
Albumin: 3.8 g/dL (ref 3.5–5.2)
Alkaline Phosphatase: 39 U/L (ref 39–117)
BUN: 14 mg/dL (ref 6–23)
CO2: 26 mEq/L (ref 19–32)
Calcium: 9.1 mg/dL (ref 8.4–10.5)
Chloride: 103 mEq/L (ref 96–112)
Creatinine, Ser: 0.79 mg/dL (ref 0.40–1.20)
GFR: 93.91 mL/min (ref >60.00)
Glucose, Bld: 90 mg/dL (ref 70–99)
Potassium: 3.5 mEq/L (ref 3.5–5.1)
Sodium: 137 mEq/L (ref 135–145)
Total Bilirubin: 0.4 mg/dL (ref 0.2–1.2)
Total Protein: 7.3 g/dL (ref 6.0–8.3)

## 2020-12-30 LAB — LIPID PANEL
Cholesterol: 183 mg/dL (ref 0–200)
HDL: 52.7 mg/dL (ref >39.00)
LDL Cholesterol: 107 mg/dL — ABNORMAL HIGH (ref 0–99)
NonHDL: 130.23
Total CHOL/HDL Ratio: 3
Triglycerides: 115 mg/dL (ref 0.0–149.0)
VLDL: 23 mg/dL (ref 0.0–40.0)

## 2021-01-06 ENCOUNTER — Other Ambulatory Visit: Payer: Self-pay

## 2021-01-06 ENCOUNTER — Ambulatory Visit (INDEPENDENT_AMBULATORY_CARE_PROVIDER_SITE_OTHER): Payer: 59 | Admitting: Family Medicine

## 2021-01-06 ENCOUNTER — Encounter: Payer: Self-pay | Admitting: Family Medicine

## 2021-01-06 VITALS — BP 124/82 | HR 96 | Temp 97.9°F | Ht 65.5 in | Wt 145.2 lb

## 2021-01-06 DIAGNOSIS — K219 Gastro-esophageal reflux disease without esophagitis: Secondary | ICD-10-CM | POA: Diagnosis not present

## 2021-01-06 DIAGNOSIS — M457 Ankylosing spondylitis of lumbosacral region: Secondary | ICD-10-CM | POA: Diagnosis not present

## 2021-01-06 DIAGNOSIS — I7300 Raynaud's syndrome without gangrene: Secondary | ICD-10-CM | POA: Diagnosis not present

## 2021-01-06 DIAGNOSIS — J4520 Mild intermittent asthma, uncomplicated: Secondary | ICD-10-CM | POA: Diagnosis not present

## 2021-01-06 DIAGNOSIS — G43701 Chronic migraine without aura, not intractable, with status migrainosus: Secondary | ICD-10-CM | POA: Diagnosis not present

## 2021-01-06 DIAGNOSIS — Z0000 Encounter for general adult medical examination without abnormal findings: Secondary | ICD-10-CM

## 2021-01-06 DIAGNOSIS — F902 Attention-deficit hyperactivity disorder, combined type: Secondary | ICD-10-CM

## 2021-01-06 DIAGNOSIS — I10 Essential (primary) hypertension: Secondary | ICD-10-CM | POA: Diagnosis not present

## 2021-01-06 MED ORDER — FLUTICASONE PROPIONATE HFA 110 MCG/ACT IN AERO
1.0000 | INHALATION_SPRAY | Freq: Two times a day (BID) | RESPIRATORY_TRACT | 11 refills | Status: DC
  Filled 2021-01-06: qty 12, 30d supply, fill #0

## 2021-01-06 MED ORDER — AMLODIPINE BESYLATE 5 MG PO TABS
5.0000 mg | ORAL_TABLET | Freq: Every day | ORAL | 3 refills | Status: DC
  Filled 2021-01-06 – 2021-04-17 (×3): qty 90, 90d supply, fill #0
  Filled 2021-07-17: qty 90, 90d supply, fill #1
  Filled 2021-10-16: qty 90, 90d supply, fill #2
  Filled 2021-12-31: qty 90, 90d supply, fill #3

## 2021-01-06 NOTE — Assessment & Plan Note (Signed)
Stable period on amlodipine.

## 2021-01-06 NOTE — Progress Notes (Signed)
Patient ID: Caitlin Burns, female    DOB: 01-29-1981, 40 y.o.   MRN: [id number]  This visit was conducted in person.  BP 124/82   Pulse 96   Temp 97.9 F (36.6 C) (Temporal)   Ht 5' 5.5" (1.664 m)   Wt 145 lb 4 oz (65.9 kg)   LMP 01/02/2021   SpO2 99%   BMI 23.80 kg/m    CC: CPE Subjective:   HPI: Caitlin Burns is a 40 y.o. female presenting on 01/06/2021 for Annual Exam   Recent dx ankylosing spondylitis seeing rheum Caitlin Burns, just started Humira '40mg'$  every other week. Known raynaud's disease.   COVID19 infection 11/2020 - treated with molnupiravir with full resolution.   ADHD on vyvanse, xanax, wellbutrin managed by psychiatrist Caitlin Burns.   Migraines - managed with ajovy injections with PRN nurtec Caitlin Burns). Ajovy caused hives ?latex reaction. Only a few migraines recently. No f/u appt scheduled. Avoiding triptans and NSAID with HTN and raynaud history.   GERD on protonix '40mg'$  daily since 2013. She had reassuring EGD 2013 as well.  Preventative: Well woman through GYN Caitlin Burns 12/2020, pap results pending  No fmhx breast cancer.  Flu shot at work [place] 03/2019, 05/2019, no booster Tdap - 05/2012  Seat belt use discussed Sunscreen use discussed. No changing moles on skin.  Sleep - averages 7  Non smoker  Alcohol - rare Dentist q6 mo, new mouth guard  Eye exam yearly - s/p lasik 2003.   Remarried '11 .1 daughter (adopted [place] 2006).  Lives with husband Caitlin Burns, daughter Caitlin Burns and son Caitlin Burns Edu: [employer] Occ: Radiation therapist at [employer]: good water, fruits/vegetables daily  Activity: walking outside during lunch  Caffeine- sodas 2 daily     Relevant past medical, surgical, family and social history reviewed and updated as indicated. Interim medical history since our last visit reviewed. Allergies and medications reviewed and updated. Outpatient Medications Prior to Visit  Medication Sig Dispense Refill   Adalimumab (HUMIRA  PEN) 40 MG/0.8ML PNKT Inject 0.8 mls (40 mg total) subcutaneously every 14 days 2 each 5   albuterol (VENTOLIN HFA) 108 (90 Base) MCG/ACT inhaler Inhale 2 puffs into the lungs every 6 (six) hours as needed for wheezing or shortness of breath. 8.5 g 0   ALPRAZolam (XANAX) 0.5 MG tablet Take by mouth 2 (two) times daily as needed. 180 tablet 0   buPROPion (WELLBUTRIN XL) 150 MG 24 hr tablet TAKE 1 TABLET BY MOUTH DAILY AFTER BREAKFAST 90 tablet 3   cyclobenzaprine (FLEXERIL) 5 MG tablet Take 1-2 tablets (5-10 mg total) by mouth 2 (two) times daily as needed (migraine headache - sedation precautions). 30 tablet 1   drospirenone-ethinyl estradiol (JASMIEL) 3-0.02 MG tablet TAKE 1 TABLET BY MOUTH DAILY, CONTINUOUS ACTIVE PILLS 112 tablet 3   lisdexamfetamine (VYVANSE) 60 MG capsule TAKE 1 CAPSULE BY MOUTH DAILY. 90 capsule 0   METRONIDAZOLE, TOPICAL, 0.75 % LOTN APPLY TO THE AFFECTED AREA(S) TO FACE ONCE OR TWICE DAILY AS DIRECTED 59 mL 2   pantoprazole (PROTONIX) 40 MG tablet TAKE 1 TABLET BY MOUTH ONCE DAILY 30 tablet 7   amLODipine (NORVASC) 5 MG tablet Take 1 tablet (5 mg total) by mouth daily. 90 tablet 3   fluticasone (FLOVENT HFA) 110 MCG/ACT inhaler Inhale 1 puff into the lungs in the morning and at bedtime. 12 g 6   Fremanezumab-vfrm 225 MG/1.5ML SOAJ INJECT 225 MG INTO THE SKIN EVERY 30 (THIRTY)  DAYS. 4.5 mL 3   No facility-administered medications prior to visit.     Per HPI unless specifically indicated in ROS section below Review of Systems  Constitutional:  Negative for activity change, appetite change, chills, fatigue, fever and unexpected weight change.  HENT:  Negative for hearing loss.   Eyes:  Negative for visual disturbance.  Respiratory:  Negative for cough, chest tightness, shortness of breath and wheezing.   Cardiovascular:  Negative for chest pain, palpitations and leg swelling.  Gastrointestinal:  Negative for abdominal distention, abdominal pain, blood in stool,  constipation, diarrhea, nausea and vomiting.  Genitourinary:  Negative for difficulty urinating and hematuria.  Musculoskeletal:  Negative for arthralgias, myalgias and neck pain.  Skin:  Negative for rash.  Neurological:  Negative for dizziness, seizures, syncope and headaches.  Hematological:  Negative for adenopathy. Does not bruise/bleed easily.  Psychiatric/Behavioral:  Negative for dysphoric mood. The patient is not nervous/anxious.    Objective:  BP 124/82   Pulse 96   Temp 97.9 F (36.6 C) (Temporal)   Ht 5' 5.5" (1.664 m)   Wt 145 lb 4 oz (65.9 kg)   LMP 01/02/2021   SpO2 99%   BMI 23.80 kg/m   Wt Readings from Last 3 Encounters:  01/06/21 145 lb 4 oz (65.9 kg)  09/09/20 150 lb 9.6 oz (68.3 kg)  08/04/20 145 lb (65.8 kg)      Physical Exam Vitals and nursing note reviewed.  Constitutional:      Appearance: Normal appearance. She is not ill-appearing.  HENT:     Head: Normocephalic and atraumatic.     Right Ear: Tympanic membrane, ear canal and external ear normal. There is no impacted cerumen.     Left Ear: Tympanic membrane, ear canal and external ear normal. There is no impacted cerumen.  Eyes:     General:        Right eye: No discharge.        Left eye: No discharge.     Extraocular Movements: Extraocular movements intact.     Conjunctiva/sclera: Conjunctivae normal.     Pupils: Pupils are equal, round, and reactive to light.  Neck:     Thyroid: No thyroid mass or thyromegaly.  Cardiovascular:     Rate and Rhythm: Normal rate and regular rhythm.     Pulses: Normal pulses.     Heart sounds: Normal heart sounds. No murmur heard. Pulmonary:     Effort: Pulmonary effort is normal. No respiratory distress.     Breath sounds: Normal breath sounds. No wheezing, rhonchi or rales.  Abdominal:     General: Bowel sounds are normal. There is no distension.     Palpations: Abdomen is soft. There is no mass.     Tenderness: There is no abdominal tenderness. There is  no guarding or rebound.     Hernia: No hernia is present.  Musculoskeletal:     Cervical back: Normal range of motion and neck supple. No rigidity.     Right lower leg: No edema.     Left lower leg: No edema.  Lymphadenopathy:     Cervical: No cervical adenopathy.  Skin:    General: Skin is warm and dry.     Findings: No rash.  Neurological:     General: No focal deficit present.     Mental Status: She is alert. Mental status is at baseline.  Psychiatric:        Mood and Affect: Mood normal.  Behavior: Behavior normal.      Results for orders placed or performed in visit on 12/30/20  Microalbumin / creatinine urine ratio  Result Value Ref Range   Microalb, Ur 1.6 0.0 - 1.9 mg/dL   Creatinine,U 181.8 mg/dL   Microalb Creat Ratio 0.9 0.0 - 30.0 mg/g  Comprehensive metabolic panel  Result Value Ref Range   Sodium 137 135 - 145 mEq/L   Potassium 3.5 3.5 - 5.1 mEq/L   Chloride 103 96 - 112 mEq/L   CO2 26 19 - 32 mEq/L   Glucose, Bld 90 70 - 99 mg/dL   BUN 14 6 - 23 mg/dL   Creatinine, Ser 0.79 0.40 - 1.20 mg/dL   Total Bilirubin 0.4 0.2 - 1.2 mg/dL   Alkaline Phosphatase 39 39 - 117 U/L   AST 10 0 - 37 U/L   ALT 4 0 - 35 U/L   Total Protein 7.3 6.0 - 8.3 g/dL   Albumin 3.8 3.5 - 5.2 g/dL   GFR 93.91 >60.00 mL/min   Calcium 9.1 8.4 - 10.5 mg/dL  Lipid panel  Result Value Ref Range   Cholesterol 183 0 - 200 mg/dL   Triglycerides 115.0 0.0 - 149.0 mg/dL   HDL 52.70 >39.00 mg/dL   VLDL 23.0 0.0 - 40.0 mg/dL   LDL Cholesterol 107 (H) 0 - 99 mg/dL   Total CHOL/HDL Ratio 3    NonHDL 130.23     Assessment & Plan:  This visit occurred during the SARS-CoV-2 public health emergency.  Safety protocols were in place, including screening questions prior to the visit, additional usage of staff PPE, and extensive cleaning of exam room while observing appropriate contact time as indicated for disinfecting solutions.   Problem List Items Addressed This Visit     Essential  hypertension    Chronic, stable on amlodipine '5mg'$  daily - continue.       Relevant Medications   amLODipine (NORVASC) 5 MG tablet   Chronic migraine without aura with status migrainosus, not intractable    Chronic, stable on nurtec abortively with infrequent migraines.  Ajovy was effective to prevent migraines however caused urticarial rash so she has stopped (?related to latex allergy).  Advised return to neuro if worsening migraine control.       Relevant Medications   amLODipine (NORVASC) 5 MG tablet   Raynaud's syndrome    Stable period on amlodipine.       Relevant Medications   amLODipine (NORVASC) 5 MG tablet   Health maintenance examination - Primary    Preventative protocols reviewed and updated unless pt declined. Discussed healthy diet and lifestyle.       Asthma    Stable period on daily flovent with PRN albuterol.  She has fully recovered from [place] illness 11/2020.       Relevant Medications   fluticasone (FLOVENT HFA) 110 MCG/ACT inhaler   Ankylosing spondylitis (HCC)    New diagnosis this year.  Now followed by rheum (Caitlin Burns).  Yesterday was first dose of Humira - hopefully she will be able to tolerate this well.       GERD (gastroesophageal reflux disease)    Continue pantoprazole '40mg'$  daily.  Working on healthy diet changes - discussed trying to taper down on dose as able due to risks of chronic PPI use.       Attention deficit hyperactivity disorder, combined type    Continue psych f/u        Meds ordered this encounter  Medications  amLODipine (NORVASC) 5 MG tablet    Sig: Take 1 tablet (5 mg total) by mouth daily.    Dispense:  90 tablet    Refill:  3   fluticasone (FLOVENT HFA) 110 MCG/ACT inhaler    Sig: Inhale 1 puff into the lungs in the morning and at bedtime.    Dispense:  12 g    Refill:  11   No orders of the defined types were placed in this encounter.   Patient instructions: You are doing well today Blood pressures are  good. Return as needed or in 1 year for next physical.   Follow up plan: Return in about 1 year (around 01/06/2022) for annual exam, prior fasting for blood work.  Ria Bush, MD

## 2021-01-06 NOTE — Assessment & Plan Note (Addendum)
New diagnosis this year.  Now followed by rheum (Dr Posey Pronto).  Yesterday was first dose of Humira - hopefully she will be able to tolerate this well.

## 2021-01-06 NOTE — Patient Instructions (Addendum)
You are doing well today Blood pressures are good. Return as needed or in 1 year for next physical.   Health Maintenance, Female Adopting a healthy lifestyle and getting preventive care are important in promoting health and wellness. Ask your health care provider about: The right schedule for you to have regular tests and exams. Things you can do on your own to prevent diseases and keep yourself healthy. What should I know about diet, weight, and exercise? Eat a healthy diet  Eat a diet that includes plenty of vegetables, fruits, low-fat dairy products, and lean protein. Do not eat a lot of foods that are high in solid fats, added sugars, or sodium. Maintain a healthy weight Body mass index (BMI) is used to identify weight problems. It estimates body fat based on height and weight. Your health care provider can help determine your BMI and help you achieve or maintain a healthy weight. Get regular exercise Get regular exercise. This is one of the most important things you can do for your health. Most adults should: Exercise for at least 150 minutes each week. The exercise should increase your heart rate and make you sweat (moderate-intensity exercise). Do strengthening exercises at least twice a week. This is in addition to the moderate-intensity exercise. Spend less time sitting. Even light physical activity can be beneficial. Watch cholesterol and blood lipids Have your blood tested for lipids and cholesterol at 40 years of age, then have this test every 5 years. Have your cholesterol levels checked more often if: Your lipid or cholesterol levels are high. You are older than 40 years of age. You are at high risk for heart disease. What should I know about cancer screening? Depending on your health history and family history, you may need to have cancer screening at various ages. This may include screening for: Breast cancer. Cervical cancer. Colorectal cancer. Skin cancer. Lung  cancer. What should I know about heart disease, diabetes, and high blood pressure? Blood pressure and heart disease High blood pressure causes heart disease and increases the risk of stroke. This is more likely to develop in people who have high blood pressure readings, are of African descent, or are overweight. Have your blood pressure checked: Every 3-5 years if you are 23-19 years of age. Every year if you are 7 years old or older. Diabetes Have regular diabetes screenings. This checks your fasting blood sugar level. Have the screening done: Once every three years after age 86 if you are at a normal weight and have a low risk for diabetes. More often and at a younger age if you are overweight or have a high risk for diabetes. What should I know about preventing infection? Hepatitis B If you have a higher risk for hepatitis B, you should be screened for this virus. Talk with your health care provider to find out if you are at risk for hepatitis B infection. Hepatitis C Testing is recommended for: Everyone born from [birth year] through 1965. Anyone with known risk factors for hepatitis C. Sexually transmitted infections (STIs) Get screened for STIs, including gonorrhea and chlamydia, if: You are sexually active and are younger than 40 years of age. You are older than 40 years of age and your health care provider tells you that you are at risk for this type of infection. Your sexual activity has changed since you were last screened, and you are at increased risk for chlamydia or gonorrhea. Ask your health care provider if you are at risk. Ask your  health care provider about whether you are at high risk for HIV. Your health care provider may recommend a prescription medicine to help prevent HIV infection. If you choose to take medicine to prevent HIV, you should first get tested for HIV. You should then be tested every 3 months for as long as you are taking the medicine. Pregnancy If you are about  to stop having your period (premenopausal) and you may become pregnant, seek counseling before you get pregnant. Take 400 to 800 micrograms (mcg) of folic acid every day if you become pregnant. Ask for birth control (contraception) if you want to prevent pregnancy. Osteoporosis and menopause Osteoporosis is a disease in which the bones lose minerals and strength with aging. This can result in bone fractures. If you are 55 years old or older, or if you are at risk for osteoporosis and fractures, ask your health care provider if you should: Be screened for bone loss. Take a calcium or vitamin D supplement to lower your risk of fractures. Be given hormone replacement therapy (HRT) to treat symptoms of menopause. Follow these instructions at home: Lifestyle Do not use any products that contain nicotine or tobacco, such as cigarettes, e-cigarettes, and chewing tobacco. If you need help quitting, ask your health care provider. Do not use street drugs. Do not share needles. Ask your health care provider for help if you need support or information about quitting drugs. Alcohol use Do not drink alcohol if: Your health care provider tells you not to drink. You are pregnant, may be pregnant, or are planning to become pregnant. If you drink alcohol: Limit how much you use to 0-1 drink a day. Limit intake if you are breastfeeding. Be aware of how much alcohol is in your drink. In the [place] one drink equals one 12 oz bottle of beer (355 mL), one 5 oz glass of wine (148 mL), or one 1 oz glass of hard liquor (44 mL). General instructions Schedule regular health, dental, and eye exams. Stay current with your vaccines. Tell your health care provider if: You often feel depressed. You have ever been abused or do not feel safe at home. Summary Adopting a healthy lifestyle and getting preventive care are important in promoting health and wellness. Follow your health care provider's instructions about  healthy diet, exercising, and getting tested or screened for diseases. Follow your health care provider's instructions on monitoring your cholesterol and blood pressure. This information is not intended to replace advice given to you by your health care provider. Make sure you discuss any questions you have with your health care provider. Document Revised: 07/01/2020 Document Reviewed: 04/16/2018 Elsevier Patient Education  2022 [employer].

## 2021-01-06 NOTE — Assessment & Plan Note (Signed)
Preventative protocols reviewed and updated unless pt declined. Discussed healthy diet and lifestyle.  

## 2021-01-06 NOTE — Assessment & Plan Note (Addendum)
Continue pantoprazole '40mg'$  daily.  Working on healthy diet changes - discussed trying to taper down on dose as able due to risks of chronic PPI use.

## 2021-01-06 NOTE — Assessment & Plan Note (Addendum)
Stable period on daily flovent with PRN albuterol.  She has fully recovered from [place] illness 11/2020.

## 2021-01-06 NOTE — Assessment & Plan Note (Signed)
Chronic, stable on nurtec abortively with infrequent migraines.  Ajovy was effective to prevent migraines however caused urticarial rash so she has stopped (?related to latex allergy).  Advised return to neuro if worsening migraine control.

## 2021-01-06 NOTE — Assessment & Plan Note (Signed)
Chronic, stable on amlodipine 5mg daily - continue.  

## 2021-01-06 NOTE — Assessment & Plan Note (Signed)
Continue psych f/u

## 2021-01-10 ENCOUNTER — Other Ambulatory Visit: Payer: Self-pay

## 2021-01-10 ENCOUNTER — Encounter: Payer: Self-pay | Admitting: Adult Health

## 2021-01-10 ENCOUNTER — Ambulatory Visit (INDEPENDENT_AMBULATORY_CARE_PROVIDER_SITE_OTHER): Payer: 59 | Admitting: Adult Health

## 2021-01-10 DIAGNOSIS — F410 Panic disorder [episodic paroxysmal anxiety] without agoraphobia: Secondary | ICD-10-CM | POA: Diagnosis not present

## 2021-01-10 DIAGNOSIS — F902 Attention-deficit hyperactivity disorder, combined type: Secondary | ICD-10-CM | POA: Diagnosis not present

## 2021-01-10 DIAGNOSIS — F411 Generalized anxiety disorder: Secondary | ICD-10-CM | POA: Diagnosis not present

## 2021-01-10 MED ORDER — ALPRAZOLAM 0.5 MG PO TABS
0.5000 mg | ORAL_TABLET | Freq: Two times a day (BID) | ORAL | 0 refills | Status: DC | PRN
  Filled 2021-01-10 – 2021-01-23 (×3): qty 180, 90d supply, fill #0

## 2021-01-10 MED ORDER — BUPROPION HCL ER (XL) 150 MG PO TB24
ORAL_TABLET | ORAL | 3 refills | Status: DC
  Filled 2021-01-10: qty 90, fill #0
  Filled 2021-03-28: qty 90, 90d supply, fill #0
  Filled 2021-06-26: qty 90, 90d supply, fill #1

## 2021-01-10 MED ORDER — LISDEXAMFETAMINE DIMESYLATE 60 MG PO CAPS
ORAL_CAPSULE | ORAL | 0 refills | Status: DC
  Filled 2021-01-10: qty 90, fill #0
  Filled 2021-01-13: qty 90, 90d supply, fill #0

## 2021-01-10 NOTE — Progress Notes (Signed)
RIANSHI ECKARDT RO:[id number] 1981/01/23 40 y.o.  Subjective:   Patient ID:  Caitlin Burns is a 40 y.o. (DOB June 11, 1980) female.  Chief Complaint: No chief complaint on file.   HPI MARCELIA KULESZA presents to the office today for follow-up of ADHD, GAD, panic disorder.   Describes mood today as "ok". Pleasant. Denies tearfulness. Mood symptoms - denies depression, anxiety, and irritability. Stating "I'm doing good". Family doing well. Stable interest and motivation. Taking medications as prescribed.  Energy levels stable. Active, does not have a regular exercise routine. Walking at lunch. Enjoys some usual interests and activities. Married. Lives with husband, 2 children and 3 dogs. Recently lost a dog she rescued. Family local. Spending time with family. Appetite adequate. Weight loss 10 pounds - 145 from 155 pounds. Sleeps well most nights. Averages 7 hours. Focus and concentration stable with Vyvanse. Completing tasks. Managing aspects of household. 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 Denies SI or HI.  Denies AH or VH.  Previous medication trials: Denies  [place] Office Visit from 01/06/2021 in [place] at [facility] Visit from 01/05/2020 in [place] at [facility] Visit from 11/06/2019 in [place] at [facility]  Total GAD-7 Score 0 1 2      [place] Visit from 01/06/2021 in [place] at [place] from 01/05/2020 in [place] at [place] from 11/06/2019 in [place] at [facility]  PHQ-2 Total Score 0 0 0  PHQ-9 Total Score 0 0 0      Flowsheet Row ED from 08/04/2020 in [place] CATEGORY No Risk        Review of Systems:  Review of Systems  Musculoskeletal:  Negative for gait problem.  Neurological:  Negative for tremors.  Psychiatric/Behavioral:         Please refer to HPI    Medications: I have reviewed the patient's current medications.  Current Outpatient Medications  Medication Sig Dispense Refill   Adalimumab (HUMIRA PEN) 40 MG/0.8ML PNKT Inject 0.8 mls (40 mg total) subcutaneously every 14 days 2 each 5   albuterol (VENTOLIN HFA) 108 (90 Base) MCG/ACT inhaler Inhale 2 puffs into the lungs every 6 (six) hours as needed for wheezing or shortness of breath. 8.5 g 0   ALPRAZolam (XANAX) 0.5 MG tablet Take 1 tablet (0.5 mg total) by mouth 2 (two) times daily as needed. 180 tablet 0   amLODipine (NORVASC) 5 MG tablet Take 1 tablet (5 mg total) by mouth daily. 90 tablet 3   buPROPion (WELLBUTRIN XL) 150 MG 24 hr tablet TAKE 1 TABLET BY MOUTH DAILY AFTER BREAKFAST 90 tablet 3   cyclobenzaprine (FLEXERIL) 5 MG tablet Take 1-2 tablets (5-10 mg total) by mouth 2 (two) times daily as needed (migraine headache - sedation precautions). 30 tablet 1   drospirenone-ethinyl estradiol (JASMIEL) 3-0.02 MG tablet TAKE 1 TABLET BY MOUTH DAILY, CONTINUOUS ACTIVE PILLS 112 tablet 3   fluticasone (FLOVENT HFA) 110 MCG/ACT inhaler Inhale 1 puff into the lungs in the morning and at bedtime. 12 g 11   lisdexamfetamine (VYVANSE) 60 MG capsule TAKE 1 CAPSULE BY MOUTH DAILY. 90 capsule 0   METRONIDAZOLE, TOPICAL, 0.75 % LOTN APPLY TO THE AFFECTED AREA(S) TO FACE ONCE OR TWICE DAILY AS DIRECTED 59 mL 2   pantoprazole (PROTONIX) 40 MG tablet TAKE 1 TABLET BY MOUTH ONCE DAILY 30 tablet  7   No current facility-administered medications for this visit.    Medication Side Effects: None  Allergies:  Allergies  Allergen Reactions   Advil [Ibuprofen] Other (See Comments)    Stroke like ibuprofen   Aspirin Hives and Rash   Sulfonamide Derivatives Hives and Rash    Past Medical History:  Diagnosis Date   Anxiety 10/06/2010   Asthma 10/06/2010   ATTENTION DEFICIT HYPERACTIVITY DISORDER, 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   DEPRESSION 07/14/2007   Diabetes mellitus without complication ([place])    gestational    Essential hypertension, benign 03/24/2010   GERD (gastroesophageal reflux disease)    Postpartum care following vaginal delivery 01/27/2013   RAYNAUD'S SYNDROME, 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   SVD (spontaneous vaginal delivery) 01/27/2013    Past Medical History, Surgical history, Social history, and Family history were reviewed and updated as appropriate.   Please see review of systems for further details on the patient's review from today.   Objective:   Physical Exam:  LMP 01/02/2021   Physical Exam Constitutional:      General: She is not in acute distress. Musculoskeletal:        General: No deformity.  Neurological:     Mental Status: She is alert and oriented to person, place, and time.     Coordination: Coordination normal.  Psychiatric:        Attention and Perception: Attention and perception normal. She does not perceive auditory or visual hallucinations.        Mood and Affect: Mood normal. Mood is not anxious or depressed. Affect is not labile, blunt, angry or inappropriate.        Speech: Speech normal.        Behavior: Behavior normal.        Thought Content: Thought content normal. Thought content is not paranoid or delusional. Thought content does not include homicidal or suicidal ideation. Thought content does not include homicidal or suicidal plan.        Cognition and Memory: Cognition and memory normal.        Judgment: Judgment normal.     Comments: Insight intact    Lab Review:     Component Value Date/Time   NA 137 12/30/2020 0733   NA 140 09/28/2019 1610   K 3.5 12/30/2020 0733   CL 103 12/30/2020 0733   CO2 26 12/30/2020 0733   GLUCOSE 90 12/30/2020 0733   BUN 14 12/30/2020 0733   BUN 13 09/28/2019 1610   CREATININE 0.79 12/30/2020 0733   CALCIUM 9.1 12/30/2020 0733   PROT 7.3 12/30/2020 0733   PROT 7.8 09/28/2019 1610   ALBUMIN 3.8 12/30/2020 0733   ALBUMIN 4.3 09/28/2019 1610   AST 10 12/30/2020 0733   ALT 4 12/30/2020 0733   ALKPHOS 39 12/30/2020  0733   BILITOT 0.4 12/30/2020 0733   BILITOT 0.2 09/28/2019 1610   GFRNONAA >60 08/04/2020 1158   GFRAA 113 09/28/2019 1610       Component Value Date/Time   WBC 7.3 08/04/2020 1158   RBC 4.59 08/04/2020 1158   HGB 14.0 08/04/2020 1158   HGB 13.4 09/28/2019 1610   HCT 38.3 08/04/2020 1158   HCT 38.2 09/28/2019 1610   PLT 346 08/04/2020 1158   PLT 320 09/28/2019 1610   MCV 83.4 08/04/2020 1158   MCV 88 09/28/2019 1610   MCH 30.5 08/04/2020 1158   MCHC 36.6 (H) 08/04/2020 1158   RDW 11.9 08/04/2020 1158   RDW 12.5 09/28/2019 1610   LYMPHSABS 2.4  08/04/2020 1158   MONOABS 0.4 08/04/2020 1158   EOSABS 0.2 08/04/2020 1158   BASOSABS 0.0 08/04/2020 1158    No results found for: POCLITH, LITHIUM   No results found for: PHENYTOIN, PHENOBARB, VALPROATE, CBMZ   .res Assessment: Plan:    Plan:  PDMP reviewed  1. Xanax 0.'5mg'$  BID 2. Vyvanse '60mg'$  daily 3. Wellbutrin XL '150mg'$  daily  124/82/80  Read and reviewed note with patient for accuracy.   RTC 6 months - will call in 6 months for refills.  Patient advised to contact office with any questions, adverse effects, or acute worsening in signs and symptoms.  Discussed potential benefits, risks, and side effects of stimulants with patient to include increased heart rate, palpitations, insomnia, increased anxiety, increased irritability, or decreased appetite.  Instructed patient to contact office if experiencing any significant tolerability issues.   Diagnoses and all orders for this visit:  Generalized anxiety disorder -     ALPRAZolam (XANAX) 0.5 MG tablet; Take 1 tablet (0.5 mg total) by mouth 2 (two) times daily as needed. -     buPROPion (WELLBUTRIN XL) 150 MG 24 hr tablet; TAKE 1 TABLET BY MOUTH DAILY AFTER BREAKFAST  Attention-deficit hyperactivity disorder, combined type -     lisdexamfetamine (VYVANSE) 60 MG capsule; TAKE 1 CAPSULE BY MOUTH DAILY.  Panic disorder    Please see After Visit Summary for patient  specific instructions.  Future Appointments  Date Time Provider [place]  07/10/2021  4:40 PM Keilany Burnette, Berdie Ogren, NP CP-CP None    No orders of the defined types were placed in this encounter.   -------------------------------

## 2021-01-12 ENCOUNTER — Other Ambulatory Visit: Payer: Self-pay

## 2021-01-13 ENCOUNTER — Other Ambulatory Visit: Payer: Self-pay

## 2021-01-16 ENCOUNTER — Other Ambulatory Visit: Payer: Self-pay

## 2021-01-20 ENCOUNTER — Other Ambulatory Visit (HOSPITAL_COMMUNITY): Payer: Self-pay

## 2021-01-23 ENCOUNTER — Other Ambulatory Visit: Payer: Self-pay

## 2021-01-26 ENCOUNTER — Other Ambulatory Visit (HOSPITAL_COMMUNITY): Payer: Self-pay

## 2021-02-01 ENCOUNTER — Telehealth: Payer: 59 | Admitting: Physician Assistant

## 2021-02-01 ENCOUNTER — Other Ambulatory Visit: Payer: Self-pay

## 2021-02-01 DIAGNOSIS — B9689 Other specified bacterial agents as the cause of diseases classified elsewhere: Secondary | ICD-10-CM

## 2021-02-01 DIAGNOSIS — J019 Acute sinusitis, unspecified: Secondary | ICD-10-CM | POA: Diagnosis not present

## 2021-02-01 MED ORDER — AMOXICILLIN-POT CLAVULANATE 875-125 MG PO TABS
1.0000 | ORAL_TABLET | Freq: Two times a day (BID) | ORAL | 0 refills | Status: DC
  Filled 2021-02-01: qty 14, 7d supply, fill #0

## 2021-02-01 NOTE — Progress Notes (Signed)
I have spent 5 minutes in review of e-visit questionnaire, review and updating patient chart, medical decision making and response to patient.   Bastion Bolger Cody Stacey Maura, PA-C    

## 2021-02-01 NOTE — Progress Notes (Signed)
E-Visit for Sinus Problems  We are sorry that you are not feeling well.  Here is how we plan to help!  Based on what you have shared with me it looks like you have sinusitis.  Sinusitis is inflammation and infection in the sinus cavities of the head.  Based on your presentation I believe you most likely have Acute Bacterial Sinusitis.  This is an infection caused by bacteria and is treated with antibiotics. I have prescribed Augmentin 875mg /125mg  one tablet twice daily with food, for 7 days. You may use an oral decongestant such as Mucinex D or if you have glaucoma or high blood pressure use plain Mucinex. Saline nasal spray help and can safely be used as often as needed for congestion.  If you develop worsening sinus pain, fever or notice severe headache and vision changes, or if symptoms are not better after completion of antibiotic, please schedule an appointment with a health care provider.    Sinus infections are not as easily transmitted as other respiratory infection, however we still recommend that you avoid close contact with loved ones, especially the very young and elderly.  Remember to wash your hands thoroughly throughout the day as this is the number one way to prevent the spread of infection!  Happy Early Birthday!!  Home Care: Only take medications as instructed by your medical team. Complete the entire course of an antibiotic. Do not take these medications with alcohol. A steam or ultrasonic humidifier can help congestion.  You can place a towel over your head and breathe in the steam from hot water coming from a faucet. Avoid close contacts especially the very young and the elderly. Cover your mouth when you cough or sneeze. Always remember to wash your hands.  Get Help Right Away If: You develop worsening fever or sinus pain. You develop a severe head ache or visual changes. Your symptoms persist after you have completed your treatment plan.  Make sure you Understand these  instructions. Will watch your condition. Will get help right away if you are not doing well or get worse.  Thank you for choosing an e-visit.  Your e-visit answers were reviewed by a board certified advanced clinical practitioner to complete your personal care plan. Depending upon the condition, your plan could have included both over the counter or prescription medications.  Please review your pharmacy choice. Make sure the pharmacy is open so you can pick up prescription now. If there is a problem, you may contact your provider through [employer] and have the prescription routed to another pharmacy.  Your safety is important to [place]. If you have drug allergies check your prescription carefully.   For the next 24 hours you can use MyChart to ask questions about today's visit, request a non-urgent call back, or ask for a work or school excuse. You will get an email in the next two days asking about your experience. I hope that your e-visit has been valuable and will speed your recovery.

## 2021-02-11 ENCOUNTER — Telehealth: Payer: 59 | Admitting: Nurse Practitioner

## 2021-02-11 DIAGNOSIS — B3731 Acute candidiasis of vulva and vagina: Secondary | ICD-10-CM

## 2021-02-11 MED ORDER — FLUCONAZOLE 150 MG PO TABS: 150.0000 mg | ORAL_TABLET | Freq: Once | ORAL | 0 refills | Status: AC

## 2021-02-11 NOTE — Progress Notes (Signed)
I have spent 5 minutes in review of e-visit questionnaire, review and updating patient chart, medical decision making and response to patient.  ° °Kabrina Christiano W Crystalle Popwell, NP ° °  °

## 2021-02-11 NOTE — Progress Notes (Signed)

## 2021-02-16 ENCOUNTER — Encounter: Payer: Self-pay | Admitting: Family Medicine

## 2021-02-16 MED ORDER — NURTEC 75 MG PO TBDP
75.0000 mg | ORAL_TABLET | Freq: Every day | ORAL | 6 refills | Status: DC | PRN
  Filled 2021-02-16: qty 8, 8d supply, fill #0
  Filled 2021-09-24 – 2021-09-26 (×2): qty 8, 8d supply, fill #1
  Filled 2021-10-04: qty 8, 15d supply, fill #1
  Filled 2022-01-02: qty 8, 15d supply, fill #2

## 2021-02-17 ENCOUNTER — Other Ambulatory Visit: Payer: Self-pay

## 2021-02-23 ENCOUNTER — Other Ambulatory Visit (HOSPITAL_COMMUNITY): Payer: Self-pay

## 2021-02-23 ENCOUNTER — Other Ambulatory Visit: Payer: Self-pay

## 2021-02-23 ENCOUNTER — Telehealth: Payer: 59 | Admitting: Physician Assistant

## 2021-02-23 DIAGNOSIS — B3731 Acute candidiasis of vulva and vagina: Secondary | ICD-10-CM | POA: Diagnosis not present

## 2021-02-23 MED ORDER — FLUCONAZOLE 150 MG PO TABS
150.0000 mg | ORAL_TABLET | Freq: Once | ORAL | 0 refills | Status: AC
Start: 2021-02-23 — End: 2021-02-24
  Filled 2021-02-23: qty 2, 3d supply, fill #0

## 2021-02-23 NOTE — Progress Notes (Signed)

## 2021-02-24 ENCOUNTER — Other Ambulatory Visit (HOSPITAL_COMMUNITY): Payer: Self-pay

## 2021-02-27 ENCOUNTER — Other Ambulatory Visit: Payer: Self-pay

## 2021-03-01 ENCOUNTER — Other Ambulatory Visit (HOSPITAL_COMMUNITY): Payer: Self-pay

## 2021-03-15 ENCOUNTER — Other Ambulatory Visit: Payer: Self-pay

## 2021-03-15 DIAGNOSIS — I7300 Raynaud's syndrome without gangrene: Secondary | ICD-10-CM | POA: Diagnosis not present

## 2021-03-15 DIAGNOSIS — M458 Ankylosing spondylitis sacral and sacrococcygeal region: Secondary | ICD-10-CM | POA: Diagnosis not present

## 2021-03-15 DIAGNOSIS — Z7960 Long term (current) use of unspecified immunomodulators and immunosuppressants: Secondary | ICD-10-CM | POA: Diagnosis not present

## 2021-03-15 DIAGNOSIS — M47819 Spondylosis without myelopathy or radiculopathy, site unspecified: Secondary | ICD-10-CM | POA: Diagnosis not present

## 2021-03-15 MED ORDER — NIFEDIPINE ER OSMOTIC RELEASE 30 MG PO TB24
ORAL_TABLET | ORAL | 5 refills | Status: DC
  Filled 2021-03-15: qty 30, 30d supply, fill #0

## 2021-03-24 ENCOUNTER — Other Ambulatory Visit (HOSPITAL_COMMUNITY): Payer: Self-pay

## 2021-03-28 ENCOUNTER — Other Ambulatory Visit: Payer: Self-pay

## 2021-04-03 ENCOUNTER — Other Ambulatory Visit (HOSPITAL_COMMUNITY): Payer: Self-pay

## 2021-04-06 ENCOUNTER — Other Ambulatory Visit: Payer: Self-pay

## 2021-04-06 MED FILL — Metronidazole Lotion 0.75%: CUTANEOUS | 30 days supply | Qty: 59 | Fill #0 | Status: AC

## 2021-04-07 ENCOUNTER — Other Ambulatory Visit: Payer: Self-pay

## 2021-04-10 ENCOUNTER — Encounter: Payer: Self-pay | Admitting: Family Medicine

## 2021-04-10 DIAGNOSIS — S299XXA Unspecified injury of thorax, initial encounter: Secondary | ICD-10-CM

## 2021-04-11 ENCOUNTER — Ambulatory Visit
Admission: RE | Admit: 2021-04-11 | Discharge: 2021-04-11 | Disposition: A | Discharge status: Home / Self Care | Payer: 59 | Source: Ambulatory Visit | Attending: Family Medicine | Admitting: Family Medicine

## 2021-04-11 ENCOUNTER — Other Ambulatory Visit: Payer: Self-pay

## 2021-04-11 ENCOUNTER — Ambulatory Visit
Admission: RE | Admit: 2021-04-11 | Discharge: 2021-04-11 | Disposition: A | Discharge status: Home / Self Care | Payer: 59 | Attending: Family Medicine | Admitting: Family Medicine

## 2021-04-11 DIAGNOSIS — S299XXA Unspecified injury of thorax, initial encounter: Secondary | ICD-10-CM

## 2021-04-11 NOTE — Addendum Note (Signed)
Addended by: Ria Bush on: 04/11/2021 08:47 AM   Modules accepted: Orders

## 2021-04-12 ENCOUNTER — Encounter: Payer: Self-pay | Admitting: Family Medicine

## 2021-04-17 ENCOUNTER — Other Ambulatory Visit: Payer: Self-pay | Admitting: Adult Health

## 2021-04-17 ENCOUNTER — Other Ambulatory Visit: Payer: Self-pay

## 2021-04-17 DIAGNOSIS — F902 Attention-deficit hyperactivity disorder, combined type: Secondary | ICD-10-CM

## 2021-04-17 MED ORDER — LISDEXAMFETAMINE DIMESYLATE 60 MG PO CAPS
ORAL_CAPSULE | ORAL | 0 refills | Status: DC
  Filled 2021-04-17: qty 90, 90d supply, fill #0

## 2021-04-18 ENCOUNTER — Other Ambulatory Visit: Payer: Self-pay

## 2021-04-26 ENCOUNTER — Other Ambulatory Visit (HOSPITAL_COMMUNITY): Payer: Self-pay

## 2021-05-03 ENCOUNTER — Other Ambulatory Visit: Payer: Self-pay

## 2021-05-03 ENCOUNTER — Other Ambulatory Visit: Payer: Self-pay | Admitting: Adult Health

## 2021-05-03 DIAGNOSIS — F411 Generalized anxiety disorder: Secondary | ICD-10-CM

## 2021-05-03 MED ORDER — ALPRAZOLAM 0.5 MG PO TABS
0.5000 mg | ORAL_TABLET | Freq: Two times a day (BID) | ORAL | 0 refills | Status: DC | PRN
  Filled 2021-05-03: qty 180, 90d supply, fill #0

## 2021-05-03 NOTE — Telephone Encounter (Signed)
Last filled 9/20 appt on 07/10/21

## 2021-05-04 ENCOUNTER — Other Ambulatory Visit: Payer: Self-pay

## 2021-05-05 ENCOUNTER — Other Ambulatory Visit: Payer: Self-pay | Admitting: Nurse Practitioner

## 2021-05-05 ENCOUNTER — Other Ambulatory Visit: Payer: Self-pay

## 2021-05-05 DIAGNOSIS — U071 COVID-19: Secondary | ICD-10-CM

## 2021-05-08 ENCOUNTER — Other Ambulatory Visit: Payer: Self-pay

## 2021-05-11 ENCOUNTER — Other Ambulatory Visit (HOSPITAL_COMMUNITY): Payer: Self-pay

## 2021-05-26 ENCOUNTER — Encounter (HOSPITAL_BASED_OUTPATIENT_CLINIC_OR_DEPARTMENT_OTHER): Payer: Self-pay | Admitting: *Deleted

## 2021-05-26 ENCOUNTER — Emergency Department (HOSPITAL_BASED_OUTPATIENT_CLINIC_OR_DEPARTMENT_OTHER): Payer: 59 | Admitting: Radiology

## 2021-05-26 ENCOUNTER — Other Ambulatory Visit: Payer: Self-pay

## 2021-05-26 ENCOUNTER — Telehealth: Payer: Self-pay

## 2021-05-26 ENCOUNTER — Emergency Department (HOSPITAL_BASED_OUTPATIENT_CLINIC_OR_DEPARTMENT_OTHER)
Admission: EM | Admit: 2021-05-26 | Discharge: 2021-05-26 | Disposition: A | Discharge status: Home / Self Care | Payer: 59 | Attending: Emergency Medicine | Admitting: Emergency Medicine

## 2021-05-26 ENCOUNTER — Emergency Department (HOSPITAL_BASED_OUTPATIENT_CLINIC_OR_DEPARTMENT_OTHER): Payer: 59

## 2021-05-26 ENCOUNTER — Encounter: Payer: Self-pay | Admitting: Family Medicine

## 2021-05-26 DIAGNOSIS — I10 Essential (primary) hypertension: Secondary | ICD-10-CM | POA: Insufficient documentation

## 2021-05-26 DIAGNOSIS — R079 Chest pain, unspecified: Secondary | ICD-10-CM | POA: Diagnosis not present

## 2021-05-26 DIAGNOSIS — J45909 Unspecified asthma, uncomplicated: Secondary | ICD-10-CM | POA: Diagnosis not present

## 2021-05-26 DIAGNOSIS — Z7951 Long term (current) use of inhaled steroids: Secondary | ICD-10-CM | POA: Insufficient documentation

## 2021-05-26 DIAGNOSIS — R519 Headache, unspecified: Secondary | ICD-10-CM | POA: Diagnosis not present

## 2021-05-26 DIAGNOSIS — G43809 Other migraine, not intractable, without status migrainosus: Secondary | ICD-10-CM | POA: Insufficient documentation

## 2021-05-26 DIAGNOSIS — R030 Elevated blood-pressure reading, without diagnosis of hypertension: Secondary | ICD-10-CM | POA: Diagnosis not present

## 2021-05-26 DIAGNOSIS — R0789 Other chest pain: Secondary | ICD-10-CM | POA: Diagnosis not present

## 2021-05-26 DIAGNOSIS — Z79899 Other long term (current) drug therapy: Secondary | ICD-10-CM | POA: Diagnosis not present

## 2021-05-26 DIAGNOSIS — R200 Anesthesia of skin: Secondary | ICD-10-CM | POA: Diagnosis not present

## 2021-05-26 DIAGNOSIS — E119 Type 2 diabetes mellitus without complications: Secondary | ICD-10-CM | POA: Diagnosis not present

## 2021-05-26 LAB — CBC
HCT: 38.6 % (ref 36.0–46.0)
Hemoglobin: 13.8 g/dL (ref 12.0–15.0)
MCH: 30.1 pg (ref 26.0–34.0)
MCHC: 35.8 g/dL (ref 30.0–36.0)
MCV: 84.1 fL (ref 80.0–100.0)
Platelets: 314 10*3/uL (ref 150–400)
RBC: 4.59 MIL/uL (ref 3.87–5.11)
RDW: 11.9 % (ref 11.5–15.5)
WBC: 7.1 10*3/uL (ref 4.0–10.5)
nRBC: 0 % (ref 0.0–0.2)

## 2021-05-26 LAB — BASIC METABOLIC PANEL
Anion gap: 8 (ref 5–15)
BUN: 12 mg/dL (ref 6–20)
CO2: 27 mmol/L (ref 22–32)
Calcium: 9.8 mg/dL (ref 8.9–10.3)
Chloride: 101 mmol/L (ref 98–111)
Creatinine, Ser: 0.72 mg/dL (ref 0.44–1.00)
GFR, Estimated: >60 mL/min (ref >60)
Glucose, Bld: 106 mg/dL — ABNORMAL HIGH (ref 70–99)
Potassium: 3.8 mmol/L (ref 3.5–5.1)
Sodium: 136 mmol/L (ref 135–145)

## 2021-05-26 LAB — TROPONIN I (HIGH SENSITIVITY)
Troponin I (High Sensitivity): <2 ng/L (ref <18)
Troponin I (High Sensitivity): <2 ng/L (ref <18)

## 2021-05-26 LAB — PREGNANCY, URINE: Preg Test, Ur: NEGATIVE

## 2021-05-26 LAB — CBG MONITORING, ED: Glucose-Capillary: 101 mg/dL — ABNORMAL HIGH (ref 70–99)

## 2021-05-26 MED ORDER — ACETAMINOPHEN 325 MG PO TABS
650.0000 mg | ORAL_TABLET | Freq: Once | ORAL | Status: AC
  Administered 2021-05-26: 650 mg via ORAL
  Filled 2021-05-26: qty 2

## 2021-05-26 MED ORDER — AMLODIPINE BESYLATE 5 MG PO TABS
5.0000 mg | ORAL_TABLET | Freq: Once | ORAL | Status: AC
Start: 2021-05-26 — End: 2021-05-26
  Administered 2021-05-26: 5 mg via ORAL
  Filled 2021-05-26: qty 1

## 2021-05-26 MED ORDER — DIPHENHYDRAMINE HCL 50 MG/ML IJ SOLN
25.0000 mg | Freq: Once | INTRAMUSCULAR | Status: AC
  Administered 2021-05-26: 25 mg via INTRAVENOUS
  Filled 2021-05-26: qty 1

## 2021-05-26 MED ORDER — BUTALBITAL-APAP-CAFFEINE 50-325-40 MG PO TABS
1.0000 | ORAL_TABLET | Freq: Four times a day (QID) | ORAL | 0 refills | Status: DC | PRN
  Filled 2021-05-26: qty 20, 5d supply, fill #0

## 2021-05-26 MED ORDER — MAGNESIUM SULFATE 2 GM/50ML IV SOLN
2.0000 g | Freq: Once | INTRAVENOUS | Status: AC
  Administered 2021-05-26: 2 g via INTRAVENOUS
  Filled 2021-05-26: qty 50

## 2021-05-26 MED ORDER — SODIUM CHLORIDE 0.9 % IV BOLUS
1000.0000 mL | Freq: Once | INTRAVENOUS | Status: AC
Start: 2021-05-26 — End: 2021-05-26
  Administered 2021-05-26: 1000 mL via INTRAVENOUS

## 2021-05-26 MED ORDER — METOCLOPRAMIDE HCL 5 MG/ML IJ SOLN
5.0000 mg | Freq: Once | INTRAMUSCULAR | Status: AC
  Administered 2021-05-26: 5 mg via INTRAVENOUS
  Filled 2021-05-26: qty 2

## 2021-05-26 NOTE — Telephone Encounter (Signed)
Per chart: Pt is at the [facility] ER [place]  Patient Name: Caitlin Burns Gender: Female DOB: 02-Sep-1980 Age: 41 Y 3 M 16 D Return Phone Number: [phone] (Primary) Address: City/ State/ Zip: [place] [address] Client [facility] - Client Client Site [place] Provider Ria Bush - MD Contact Type Call Who Is Calling Patient / Member / Family / Caregiver Call Type Triage / Clinical Relationship To Patient Self Return Phone Number [phone] (Primary) Chief Complaint Numbness Reason for Call Symptomatic / Request for [place] states her blood pressure has been elevated. She is also experiencing left arm numbness. He has a headache as well. Translation No Nurse Assessment Nurse: Thad Ranger, RN, Denise Date/Time (Eastern Time): 05/26/2021 8:46:23 AM Confirm and document reason for call. If symptomatic, describe symptoms. ---Caller states her blood pressure has been elevated (174/117 this am 1 hr after bp med). She had left arm numbness last pm but resolved now. He has a headache as well. Does the patient have any new or worsening symptoms? ---Yes Will a triage be completed? ---Yes Related visit to physician within the last 2 weeks? ---No Does the PT have any chronic conditions? (i.e. diabetes, asthma, this includes High risk factors for pregnancy, etc.) ---Yes List chronic conditions. ---HTN Is the patient pregnant or possibly pregnant? (Ask all females between the ages of 27-55) ---No Is this a behavioral health or substance abuse call? ---No Guidelines Guideline Title Affirmed Question Affirmed Notes Nurse Date/Time (Eastern Time) Blood Pressure - High [8] Systolic BP >= 850 OR Diastolic >= 277 AND [4] cardiac or neurologic symptoms (e.g., chest pain, difficulty Carmon, RN, Langley Gauss 05/26/2021 8:48:05 AM PLEASE NOTE: All timestamps contained within this report are  represented as [place] Standard Time. CONFIDENTIALTY NOTICE: This fax transmission is intended only for the addressee. It contains information that is legally privileged, confidential or otherwise protected from use or disclosure. If you are not the intended recipient, you are strictly prohibited from reviewing, disclosing, copying using or disseminating any of this information or taking any action in reliance on or regarding this information. If you have received this fax in error, please notify us immediately by telephone so that we can arrange for its return to [place]. Phone: [phone], Toll-Free: [phone], Fax: [phone] Page: 2 of 2 Call Id: [id number] Guidelines Guideline Title Affirmed Question Affirmed Notes Nurse Date/Time Eilene Ghazi Time) breathing, unsteady gait, blurred vision) Disp. Time Eilene Ghazi Time) Disposition Final User 05/26/2021 8:50:24 AM Go to ED Now Yes Carmon, RN, Yevette Edwards Disagree/Comply Comply Caller Understands Yes PreDisposition Call Doctor Care Advice Given Per Guideline GO TO ED NOW: NOTE TO TRIAGER - DRIVING: * Another adult should drive. CARE ADVICE given per High Blood Pressure (Adult) guideline. Referrals [facility] - ED

## 2021-05-26 NOTE — ED Provider Notes (Signed)
[facility] EMERGENCY DEPT Provider Note   CSN: [id number] Arrival date & time: 05/26/21  4128     History  Chief Complaint  Patient presents with   Hypertension    Caitlin Burns is a 41 y.o. female.  This is a 41 y.o. female  with significant medical history as below, including raynaud's phenomenon, HTN, anxiety who presents to the ED with complaint of HA, arm tingling, elevated BP. Pt with similar symptoms 3/22 attributed to migraine/elevated BP. She reports yesterday afternoon she felt as though her arm was "falling asleep." Symptoms have gradually improved since onset, now described as more of a "prickly" feeling. She continues to have a mild HA. No n/v. She has mild chest tightness over the past 2 days, no described as pleuritic, not worsened with exertion. No sig pain, only reported as tightness. No dib. She called PCP but was told to come to ED for evaluation. Taking all home meds as prescribed. No recent diet changes or travel, no hx DVT or PE. 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 Unchanged from baseline.    Past Medical History: 10/06/2010: Anxiety 10/06/2010: Asthma 03/23/2010: ATTENTION DEFICIT HYPERACTIVITY DISORDER, HX OF 07/14/2007: DEPRESSION No date: Diabetes mellitus without complication ([place])     Comment:  gestational 03/24/2010: Essential hypertension, benign No date: GERD (gastroesophageal reflux disease) 01/27/2013: Postpartum care following vaginal delivery 03/23/2010: RAYNAUD'S SYNDROME, HX OF 01/27/2013: SVD (spontaneous vaginal delivery)     The history is provided by the patient. No language interpreter was used.  Hypertension Associated symptoms include headaches. Pertinent negatives include no chest pain, no abdominal pain and no shortness of breath.      Home Medications Prior to Admission medications   Medication Sig Start Date End Date Taking? Authorizing Provider   butalbital-acetaminophen-caffeine (FIORICET) [phone] MG tablet Take 1 tablet by mouth every 6 (six) hours as needed for headache. 05/26/21 05/26/22 Yes Wynona Dove A, DO  Adalimumab (HUMIRA PEN) 40 MG/0.8ML PNKT Inject 0.8 mls (40 mg total) subcutaneously every 14 days 12/21/20   Tresa Garter, MD  albuterol (VENTOLIN HFA) 108 (90 Base) MCG/ACT inhaler Inhale 2 puffs into the lungs every 6 (six) hours as needed for wheezing or shortness of breath. 11/21/20   Apolonio Schneiders, FNP  ALPRAZolam Duanne Moron) 0.5 MG tablet Take 1 tablet (0.5 mg total) by mouth 2 (two) times daily as needed. 05/03/21 10/30/21  Mozingo, Berdie Ogren, NP  amLODipine (NORVASC) 5 MG tablet Take 1 tablet (5 mg total) by mouth daily. 01/06/21   Ria Bush, MD  buPROPion (WELLBUTRIN XL) 150 MG 24 hr tablet TAKE 1 TABLET BY MOUTH DAILY AFTER BREAKFAST 01/10/21 01/10/22  Mozingo, Berdie Ogren, NP  cyclobenzaprine (FLEXERIL) 5 MG tablet Take 1-2 tablets (5-10 mg total) by mouth 2 (two) times daily as needed (migraine headache - sedation precautions). 11/06/19   Ria Bush, MD  drospirenone-ethinyl estradiol (JASMIEL) 3-0.02 MG tablet TAKE 1 TABLET BY MOUTH DAILY, CONTINUOUS ACTIVE PILLS 12/28/20     fluticasone (FLOVENT HFA) 110 MCG/ACT inhaler Inhale 1 puff into the lungs in the morning and at bedtime. 01/06/21   Ria Bush, MD  lisdexamfetamine (VYVANSE) 60 MG capsule TAKE 1 CAPSULE BY MOUTH DAILY. 04/17/21   Mozingo, Berdie Ogren, NP  NIFEdipine (PROCARDIA-XL/NIFEDICAL-XL) 30 MG 24 hr tablet Take 1 tablet (30 mg total) by mouth once daily 03/15/21     pantoprazole (PROTONIX) 40 MG tablet TAKE 1 TABLET BY MOUTH ONCE DAILY 09/14/20  09/14/21  Brien Few, MD  Rimegepant Sulfate (NURTEC) 75 MG TBDP Take 75 mg by mouth daily as needed. For migraines. Take as close to onset of migraine as possible. One daily maximum. 02/16/21   Ria Bush, MD      Allergies    Advil [ibuprofen], Aspirin, and Sulfonamide  derivatives    Review of Systems   Review of Systems  Constitutional:  Negative for chills and fever.  HENT:  Negative for facial swelling and trouble swallowing.   Eyes:  Negative for photophobia and visual disturbance.  Respiratory:  Negative for cough and shortness of breath.   Cardiovascular:  Negative for chest pain and palpitations.  Gastrointestinal:  Negative for abdominal pain, nausea and vomiting.  Endocrine: Negative for polydipsia and polyuria.  Genitourinary:  Negative for difficulty urinating and hematuria.  Musculoskeletal:  Negative for gait problem and joint swelling.  Skin:  Negative for pallor and rash.  Neurological:  Positive for numbness and headaches. Negative for syncope.  Psychiatric/Behavioral:  Negative for agitation and confusion.    Physical Exam Updated Vital Signs BP 140/86    Pulse 88    Temp 98.5 F (36.9 C) (Oral)    Resp 17    Wt 65.8 kg    SpO2 100%    BMI 23.76 kg/m  Physical Exam Vitals and nursing note reviewed.  Constitutional:      General: She is not in acute distress.    Appearance: Normal appearance.  HENT:     Head: Normocephalic and atraumatic.     Right Ear: External ear normal.     Left Ear: External ear normal.     Nose: Nose normal.     Mouth/Throat:     Mouth: Mucous membranes are moist.  Eyes:     General: No scleral icterus.       Right eye: No discharge.        Left eye: No discharge.     Extraocular Movements: Extraocular movements intact.     Pupils: Pupils are equal, round, and reactive to light.  Cardiovascular:     Rate and Rhythm: Normal rate and regular rhythm.     Pulses: Normal pulses.     Heart sounds: Normal heart sounds.  Pulmonary:     Effort: Pulmonary effort is normal. No respiratory distress.     Breath sounds: Normal breath sounds.  Abdominal:     General: Abdomen is flat.     Tenderness: There is no abdominal tenderness.  Musculoskeletal:        General: Normal range of motion.     Cervical  back: Full passive range of motion without pain and normal range of motion.     Right lower leg: No edema.     Left lower leg: No edema.  Skin:    General: Skin is warm and dry.     Capillary Refill: Capillary refill takes less than 2 seconds.  Neurological:     Mental Status: She is alert and oriented to person, place, and time.     GCS: GCS eye subscore is 4. GCS verbal subscore is 5. GCS motor subscore is 6.     Cranial Nerves: Cranial nerves 2-12 are intact. No dysarthria.     Sensory: Sensation is intact.     Motor: Motor function is intact. No tremor or pronator drift.     Coordination: Coordination is intact. Finger-Nose-Finger Test normal.     Gait: Gait is intact.  Psychiatric:  Mood and Affect: Mood normal.        Behavior: Behavior normal.    ED Results / Procedures / Treatments   Labs (all labs ordered are listed, but only abnormal results are displayed) Labs Reviewed  BASIC METABOLIC PANEL - Abnormal; Notable for the following components:      Result Value   Glucose, Bld 106 (*)    All other components within normal limits  CBG MONITORING, ED - Abnormal; Notable for the following components:   Glucose-Capillary 101 (*)    All other components within normal limits  CBC  PREGNANCY, URINE  TROPONIN I (HIGH SENSITIVITY)  TROPONIN I (HIGH SENSITIVITY)    EKG EKG Interpretation  Date/Time:  Friday May 26 2021 09:43:08 EST Ventricular Rate:  99 PR Interval:  136 QRS Duration: 92 QT Interval:  360 QTC Calculation: 462 R Axis:   108 Text Interpretation: Normal sinus rhythm Biatrial enlargement Rightward axis Incomplete right bundle branch block Abnormal ECG When compared with ECG of 04-Aug-2020 11:54, No significant change was found no stemi Confirmed by Wynona Dove ([id number]) on 05/26/2021 10:43:44 AM  Radiology DG Chest 2 View  Result Date: 05/26/2021 CLINICAL DATA:  Chest pain EXAM: CHEST - 2 VIEW COMPARISON:  April 11, 2021 FINDINGS: The heart size  and mediastinal contours are within normal limits. Both lungs are clear. The visualized skeletal structures are unremarkable. IMPRESSION: No active cardiopulmonary disease. Electronically Signed   By: Abelardo Diesel M.D.   On: 05/26/2021 10:04   CT Head Wo Contrast  Result Date: 05/26/2021 CLINICAL DATA:  Arm numbness and headache. EXAM: CT HEAD WITHOUT CONTRAST TECHNIQUE: Contiguous axial images were obtained from the base of the skull through the vertex without intravenous contrast. RADIATION DOSE REDUCTION: This exam was performed according to the departmental dose-optimization program which includes automated exposure control, adjustment of the mA and/or kV according to patient size and/or use of iterative reconstruction technique. COMPARISON:  08/04/2020 FINDINGS: Brain: There is no evidence for acute hemorrhage, hydrocephalus, mass lesion, or abnormal extra-axial fluid collection. No definite CT evidence for acute infarction. Vascular: No hyperdense vessel or unexpected calcification. Skull: No evidence for fracture. No worrisome lytic or sclerotic lesion. Sinuses/Orbits: The visualized paranasal sinuses and mastoid air cells are clear. Visualized portions of the globes and intraorbital fat are unremarkable. Other: None. IMPRESSION: No acute intracranial abnormality. Electronically Signed   By: Misty Stanley M.D.   On: 05/26/2021 11:51    Procedures Procedures    Medications Ordered in ED Medications  amLODipine (NORVASC) tablet 5 mg (5 mg Oral Given 05/26/21 1243)  sodium chloride 0.9 % bolus 1,000 mL (0 mLs Intravenous Stopped 05/26/21 1401)  metoCLOPramide (REGLAN) injection 5 mg (5 mg Intravenous Given 05/26/21 1246)  diphenhydrAMINE (BENADRYL) injection 25 mg (25 mg Intravenous Given 05/26/21 1248)  magnesium sulfate IVPB 2 g 50 mL (0 g Intravenous Stopped 05/26/21 1258)  acetaminophen (TYLENOL) tablet 650 mg (650 mg Oral Given 05/26/21 1244)    ED Course/ Medical Decision Making/ A&P                            Medical Decision Making Amount and/or Complexity of Data Reviewed Labs: ordered. Radiology: ordered.  Risk OTC drugs. Prescription drug management.    CC: ha, arm tingling, cp  This patient complains of above; this involves an extensive number of treatment options and is a complaint that carries with it a high risk of complications and morbidity. Vital  signs were reviewed. Serious etiologies considered.  No focal deficits on my exam.  Symptoms have improved since the onset.  Very low suspicion for CVA.  Similar symptoms last March.  Negative CT head and MRI.  Record review:  Previous records obtained and reviewed   Work up as above, notable for:  Labs & imaging results that were available during my care of the patient were reviewed by me and considered in my medical decision making.   I ordered imaging studies which included CT head, chest x-ray and I independently visualized and interpreted imaging which showed no acute process  Cardiac monitoring reviewed and interpreted personally which shows normal sinus rhythm  Management: Patient given amlodipine, migraine cocktail  Reassessment:  Patient reports that she is feeling much better.  Her symptoms have essentially resolved.  She feels that she is back to her baseline.  Repeat neurologic exam is nonfocal.  Favor complex migraine as etiology of her symptoms.  Negative CT head.  She had similar symptoms last March attributed to elevated blood pressure, migraine.  Advised patient follow-up with her PCP regarding blood pressure medication management.  No evidence of end organ damage at this time.  No evidence hypertension emergency  The patient's chest pain is not suggestive of pulmonary embolus, cardiac ischemia, aortic dissection, pericarditis, myocarditis, pulmonary embolism, pneumothorax, pneumonia, Zoster, or esophageal perforation, or other serious etiology.  Historically not abrupt in onset, tearing or  ripping, pulses symmetric. EKG nonspecific for ischemia/infarction. No dysrhythmias, brugada, WPW, prolonged QT noted. Troponin negative x2. CXR reviewed. Labs without demonstration of acute pathology unless otherwise noted above. Low HEART Score: 0-3 points (0.9-1.7% risk of MACE). Given the extremely low risk of these diagnoses further testing and evaluation for these possibilities does not appear to be indicated at this time.   HA today similar to prior, resolved at this time. Favor migraine.    Patient in no distress and overall condition improved here in the ED. Detailed discussions were had with the patient regarding current findings, and need for close f/u with PCP or on call doctor. The patient has been instructed to return immediately if the symptoms worsen in any way for re-evaluation. Patient verbalized understanding and is in agreement with current care plan. All questions answered prior to discharge.          This chart was dictated using voice recognition software.  Despite best efforts to proofread,  errors can occur which can change the documentation meaning.         Final Clinical Impression(s) / ED Diagnoses Final diagnoses:  Other migraine without status migrainosus, not intractable  Elevated blood pressure reading  Atypical chest pain    Rx / DC Orders ED Discharge Orders          Ordered    butalbital-acetaminophen-caffeine (FIORICET) 50-325-40 MG tablet  Every 6 hours PRN        05/26/21 1451              Jeanell Sparrow, DO 05/26/21 1453

## 2021-05-26 NOTE — ED Triage Notes (Signed)
Pt is here for evaluation of HTN, she noted some HA and left arm numbness yesterday and took additional BP med but continued to have a weird feeling in her left arm and earlier in the week she had some nausea.  Today pt also has some central chest pain with this.

## 2021-05-26 NOTE — Discharge Instructions (Addendum)
Please follow-up with PCP regarding elevated blood pressure, headaches.   It was a pleasure caring for you today in the emergency department.  Please return to the emergency department for any worsening or worrisome symptoms.

## 2021-05-29 ENCOUNTER — Other Ambulatory Visit: Payer: Self-pay

## 2021-05-30 ENCOUNTER — Other Ambulatory Visit: Payer: Self-pay

## 2021-05-30 MED ORDER — PANTOPRAZOLE SODIUM 40 MG PO TBEC
40.0000 mg | DELAYED_RELEASE_TABLET | Freq: Every day | ORAL | 7 refills | Status: DC
  Filled 2021-05-30: qty 30, 30d supply, fill #0
  Filled 2021-06-20: qty 90, 90d supply, fill #1
  Filled 2021-10-09: qty 90, 90d supply, fill #2
  Filled 2021-12-31: qty 30, 30d supply, fill #3

## 2021-06-05 ENCOUNTER — Other Ambulatory Visit (HOSPITAL_COMMUNITY): Payer: Self-pay

## 2021-06-07 DIAGNOSIS — R768 Other specified abnormal immunological findings in serum: Secondary | ICD-10-CM

## 2021-06-07 HISTORY — DX: Other specified abnormal immunological findings in serum: R76.8

## 2021-06-08 ENCOUNTER — Other Ambulatory Visit (HOSPITAL_COMMUNITY): Payer: Self-pay

## 2021-06-08 ENCOUNTER — Other Ambulatory Visit: Payer: Self-pay

## 2021-06-10 ENCOUNTER — Telehealth: Payer: 59 | Admitting: Nurse Practitioner

## 2021-06-10 DIAGNOSIS — R12 Heartburn: Secondary | ICD-10-CM

## 2021-06-10 MED ORDER — SUCRALFATE 1 G PO TABS: 1.0000 g | ORAL_TABLET | Freq: Three times a day (TID) | ORAL | 0 refills | Status: DC

## 2021-06-10 NOTE — Progress Notes (Signed)
E-Visit for Heartburn  We are sorry that you are not feeling well.  Here is how we plan to help!  Based on what you shared with me it looks like you most likely have Gastroesophageal Reflux Disease (GERD)  Gastroesophageal reflux disease (GERD) happens when acid from your stomach flows up into the esophagus.  When acid comes in contact with the esophagus, the acid causes sorenss (inflammation) in the esophagus.  Over time, GERD may create small holes (ulcers) in the lining of the esophagus.  I have prescribed carafate  Your symptoms should improve in the next day or two.  You can use antacids as needed until symptoms resolve.  Call us if your heartburn worsens, you have trouble swallowing, weight loss, spitting up blood or recurrent vomiting.  Home Care: May include lifestyle changes such as weight loss, quitting smoking and alcohol consumption Avoid foods and drinks that make your symptoms worse, such as: Caffeine or alcoholic drinks Chocolate Peppermint or mint flavorings Garlic and onions Spicy foods Citrus fruits, such as oranges, lemons, or limes Tomato-based foods such as sauce, chili, salsa and pizza Fried and fatty foods Avoid lying down for 3 hours prior to your bedtime or prior to taking a nap Eat small, frequent meals instead of a large meals Wear loose-fitting clothing.  Do not wear anything tight around your waist that causes pressure on your stomach. Raise the head of your bed 6 to 8 inches with wood blocks to help you sleep.  Extra pillows will not help.  Seek Help Right Away If: You have pain in your arms, neck, jaw, teeth or back Your pain increases or changes in intensity or duration You develop nausea, vomiting or sweating (diaphoresis) You develop shortness of breath or you faint Your vomit is green, yellow, black or looks like coffee grounds or blood Your stool is red, bloody or black  These symptoms could be signs of other problems, such as heart disease,  gastric bleeding or esophageal bleeding.  Make sure you : Understand these instructions. Will watch your condition. Will get help right away if you are not doing well or get worse.  Your e-visit answers were reviewed by a board certified advanced clinical practitioner to complete your personal care plan.  Depending on the condition, your plan could have included both over the counter or prescription medications.  If there is a problem please reply  once you have received a response from your provider.  Your safety is important to [place].  If you have drug allergies check your prescription carefully.    You can use MyChart to ask questions about todays visit, request a non-urgent call back, or ask for a work or school excuse for 24 hours related to this e-Visit. If it has been greater than 24 hours you will need to follow up with your provider, or enter a new e-Visit to address those concerns.  You will get an e-mail in the next two days asking about your experience.  I hope that your e-visit has been valuable and will speed your recovery. Thank you for using e-visits.  5-10 minutes spent reviewing and documenting in chart.

## 2021-06-13 ENCOUNTER — Other Ambulatory Visit: Payer: Self-pay | Admitting: Family Medicine

## 2021-06-16 ENCOUNTER — Other Ambulatory Visit: Payer: Self-pay

## 2021-06-16 ENCOUNTER — Ambulatory Visit (INDEPENDENT_AMBULATORY_CARE_PROVIDER_SITE_OTHER): Payer: 59 | Admitting: Nurse Practitioner

## 2021-06-16 ENCOUNTER — Encounter: Payer: Self-pay | Admitting: Nurse Practitioner

## 2021-06-16 VITALS — BP 130/78 | HR 87 | Temp 97.9°F | Resp 12 | Ht 65.5 in | Wt 144.2 lb

## 2021-06-16 DIAGNOSIS — R1013 Epigastric pain: Secondary | ICD-10-CM | POA: Diagnosis not present

## 2021-06-16 DIAGNOSIS — R3989 Other symptoms and signs involving the genitourinary system: Secondary | ICD-10-CM | POA: Diagnosis not present

## 2021-06-16 DIAGNOSIS — R8290 Unspecified abnormal findings in urine: Secondary | ICD-10-CM

## 2021-06-16 DIAGNOSIS — K219 Gastro-esophageal reflux disease without esophagitis: Secondary | ICD-10-CM

## 2021-06-16 LAB — POCT URINALYSIS DIPSTICK
Bilirubin, UA: NEGATIVE
Blood, UA: NEGATIVE
Glucose, UA: NEGATIVE
Leukocytes, UA: NEGATIVE
Nitrite, UA: NEGATIVE
Protein, UA: POSITIVE — AB
Spec Grav, UA: 1.025 (ref 1.010–1.025)
Urobilinogen, UA: 1 E.U./dL
pH, UA: 6 (ref 5.0–8.0)

## 2021-06-16 NOTE — Assessment & Plan Note (Signed)
States it smelled like "nursing home".  Urine was dark color in office UA showed higher specific gravity and ketones.

## 2021-06-16 NOTE — Assessment & Plan Note (Signed)
History of the same.  EGD back in 2013 that showed GERD.  Was placed on PPI.  Patient has been on Protonix 40 daily and Carafate 1 g 4 times daily.  Having some relief but not adequate.  We will increase her Protonix to 40 mg twice daily over the weekend to recheck on Monday.  Patient made aware of this plan no red flags on exam pending lab results
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
## 2021-06-16 NOTE — Assessment & Plan Note (Signed)
Given color and UA with patient stating she is drinking adequate amounts of water we will check CK to make sure she is not metabolizing muscle

## 2021-06-16 NOTE — Patient Instructions (Signed)
Nice to see you today Continue taking the carafate and protonix as prescribed I will be in touch in regards to the lab results

## 2021-06-19 LAB — CBC WITH DIFFERENTIAL/PLATELET
Basophils Absolute: 0 10*3/uL (ref 0.0–0.2)
Basos: 0 %
EOS (ABSOLUTE): 0.1 10*3/uL (ref 0.0–0.4)
Eos: 2 %
Hematocrit: 37 % (ref 34.0–46.6)
Hemoglobin: 13.3 g/dL (ref 11.1–15.9)
Immature Grans (Abs): 0 10*3/uL (ref 0.0–0.1)
Immature Granulocytes: 0 %
Lymphocytes Absolute: 3.5 10*3/uL — ABNORMAL HIGH (ref 0.7–3.1)
Lymphs: 52 %
MCH: 30.6 pg (ref 26.6–33.0)
MCHC: 35.9 g/dL — ABNORMAL HIGH (ref 31.5–35.7)
MCV: 85 fL (ref 79–97)
Monocytes Absolute: 0.6 10*3/uL (ref 0.1–0.9)
Monocytes: 8 %
Neutrophils Absolute: 2.6 10*3/uL (ref 1.4–7.0)
Neutrophils: 38 %
Platelets: 348 10*3/uL (ref 150–450)
RBC: 4.35 x10E6/uL (ref 3.77–5.28)
RDW: 11.7 % (ref 11.7–15.4)
WBC: 6.9 10*3/uL (ref 3.4–10.8)

## 2021-06-19 LAB — COMPREHENSIVE METABOLIC PANEL
ALT: 11 IU/L (ref 0–32)
AST: 13 IU/L (ref 0–40)
Albumin/Globulin Ratio: 1.5 (ref 1.2–2.2)
Albumin: 4.6 g/dL (ref 3.8–4.8)
Alkaline Phosphatase: 44 IU/L (ref 44–121)
BUN/Creatinine Ratio: 12 (ref 9–23)
BUN: 9 mg/dL (ref 6–24)
Bilirubin Total: 0.3 mg/dL (ref 0.0–1.2)
CO2: 22 mmol/L (ref 20–29)
Calcium: 9.5 mg/dL (ref 8.7–10.2)
Chloride: 101 mmol/L (ref 96–106)
Creatinine, Ser: 0.76 mg/dL (ref 0.57–1.00)
Globulin, Total: 3 g/dL (ref 1.5–4.5)
Glucose: 81 mg/dL (ref 70–99)
Potassium: 3.5 mmol/L (ref 3.5–5.2)
Sodium: 141 mmol/L (ref 134–144)
Total Protein: 7.6 g/dL (ref 6.0–8.5)
eGFR: 102 mL/min/{1.73_m2} (ref >59)

## 2021-06-19 LAB — H. PYLORI ANTIBODY, IGG: H. pylori, IgG AbS: 1.61 Index Value — ABNORMAL HIGH (ref 0.00–0.79)

## 2021-06-19 LAB — CK: Total CK: 55 U/L (ref 32–182)

## 2021-06-19 LAB — LIPASE: Lipase: 40 U/L (ref 14–72)

## 2021-06-20 ENCOUNTER — Other Ambulatory Visit: Payer: Self-pay | Admitting: Nurse Practitioner

## 2021-06-20 ENCOUNTER — Other Ambulatory Visit: Payer: Self-pay

## 2021-06-20 DIAGNOSIS — A048 Other specified bacterial intestinal infections: Secondary | ICD-10-CM

## 2021-06-20 DIAGNOSIS — R1013 Epigastric pain: Secondary | ICD-10-CM

## 2021-06-20 MED ORDER — AMOXICILLIN 500 MG PO CAPS
1000.0000 mg | ORAL_CAPSULE | Freq: Two times a day (BID) | ORAL | 0 refills | Status: AC
  Filled 2021-06-20: qty 56, 14d supply, fill #0

## 2021-06-20 MED ORDER — PANTOPRAZOLE SODIUM 20 MG PO TBEC
20.0000 mg | DELAYED_RELEASE_TABLET | Freq: Two times a day (BID) | ORAL | 0 refills | Status: DC
  Filled 2021-06-20: qty 28, 14d supply, fill #0

## 2021-06-20 MED ORDER — CLARITHROMYCIN 500 MG PO TABS
500.0000 mg | ORAL_TABLET | Freq: Two times a day (BID) | ORAL | 0 refills | Status: AC
  Filled 2021-06-20: qty 28, 14d supply, fill #0

## 2021-06-21 ENCOUNTER — Encounter: Payer: Self-pay | Admitting: Nurse Practitioner

## 2021-06-23 ENCOUNTER — Other Ambulatory Visit: Payer: Self-pay

## 2021-06-26 ENCOUNTER — Other Ambulatory Visit: Payer: Self-pay

## 2021-06-29 ENCOUNTER — Other Ambulatory Visit (HOSPITAL_COMMUNITY): Payer: Self-pay

## 2021-06-30 ENCOUNTER — Other Ambulatory Visit (HOSPITAL_COMMUNITY): Payer: Self-pay

## 2021-07-03 ENCOUNTER — Other Ambulatory Visit: Payer: Self-pay | Admitting: Pharmacist

## 2021-07-03 ENCOUNTER — Other Ambulatory Visit (HOSPITAL_COMMUNITY): Payer: Self-pay

## 2021-07-03 MED ORDER — HUMIRA (2 PEN) 40 MG/0.4ML ~~LOC~~ AJKT
AUTO-INJECTOR | SUBCUTANEOUS | 5 refills | Status: DC
  Filled 2021-07-03: qty 2, 28d supply, fill #0
  Filled 2021-07-31: qty 2, 28d supply, fill #1
  Filled 2021-08-22: qty 2, 28d supply, fill #2
  Filled 2021-09-25: qty 2, 28d supply, fill #3
  Filled 2021-10-19: qty 2, 28d supply, fill #4
  Filled 2021-11-14: qty 2, 28d supply, fill #5

## 2021-07-03 MED ORDER — HUMIRA (2 PEN) 40 MG/0.4ML ~~LOC~~ AJKT: AUTO-INJECTOR | SUBCUTANEOUS | 5 refills | Status: DC

## 2021-07-06 ENCOUNTER — Other Ambulatory Visit (HOSPITAL_COMMUNITY): Payer: Self-pay

## 2021-07-07 ENCOUNTER — Other Ambulatory Visit (HOSPITAL_COMMUNITY): Payer: Self-pay

## 2021-07-08 ENCOUNTER — Other Ambulatory Visit (HOSPITAL_COMMUNITY): Payer: Self-pay

## 2021-07-10 ENCOUNTER — Other Ambulatory Visit: Payer: Self-pay

## 2021-07-10 ENCOUNTER — Other Ambulatory Visit (HOSPITAL_COMMUNITY): Payer: Self-pay

## 2021-07-10 ENCOUNTER — Encounter: Payer: Self-pay | Admitting: Adult Health

## 2021-07-10 ENCOUNTER — Telehealth (INDEPENDENT_AMBULATORY_CARE_PROVIDER_SITE_OTHER): Payer: 59 | Admitting: Adult Health

## 2021-07-10 DIAGNOSIS — F410 Panic disorder [episodic paroxysmal anxiety] without agoraphobia: Secondary | ICD-10-CM | POA: Diagnosis not present

## 2021-07-10 DIAGNOSIS — F902 Attention-deficit hyperactivity disorder, combined type: Secondary | ICD-10-CM

## 2021-07-10 DIAGNOSIS — F411 Generalized anxiety disorder: Secondary | ICD-10-CM | POA: Diagnosis not present

## 2021-07-10 MED ORDER — LISDEXAMFETAMINE DIMESYLATE 60 MG PO CAPS
ORAL_CAPSULE | ORAL | 0 refills | Status: DC
Start: 2021-07-10 — End: 2021-10-16
  Filled 2021-07-10: qty 90, fill #0
  Filled 2021-07-17: qty 90, 90d supply, fill #0

## 2021-07-10 MED ORDER — BUPROPION HCL ER (XL) 150 MG PO TB24
ORAL_TABLET | ORAL | 3 refills | Status: DC
  Filled 2021-07-10 – 2021-09-24 (×2): qty 90, 90d supply, fill #0
  Filled 2021-12-19: qty 62, 62d supply, fill #1
  Filled 2021-12-19: qty 28, 28d supply, fill #1

## 2021-07-10 MED ORDER — ALPRAZOLAM 0.5 MG PO TABS
0.5000 mg | ORAL_TABLET | Freq: Two times a day (BID) | ORAL | 0 refills | Status: DC | PRN
Start: 2021-07-10 — End: 2021-10-09
  Filled 2021-07-10 – 2021-08-01 (×2): qty 180, 90d supply, fill #0
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
## 2021-07-13 DIAGNOSIS — Z7960 Long term (current) use of unspecified immunomodulators and immunosuppressants: Secondary | ICD-10-CM | POA: Diagnosis not present

## 2021-07-13 DIAGNOSIS — I7300 Raynaud's syndrome without gangrene: Secondary | ICD-10-CM | POA: Diagnosis not present

## 2021-07-13 DIAGNOSIS — M458 Ankylosing spondylitis sacral and sacrococcygeal region: Secondary | ICD-10-CM | POA: Diagnosis not present

## 2021-07-17 ENCOUNTER — Other Ambulatory Visit: Payer: Self-pay

## 2021-07-25 ENCOUNTER — Other Ambulatory Visit (HOSPITAL_COMMUNITY): Payer: Self-pay

## 2021-07-28 ENCOUNTER — Other Ambulatory Visit (HOSPITAL_COMMUNITY): Payer: Self-pay

## 2021-07-28 IMAGING — MR MR HEAD W/O CM
11 series · 48 of 48 positions shown · non-contrast
Comparison: 0739

CLINICAL DATA: Right eye vision changes

EXAM:
MRI HEAD WITHOUT CONTRAST
TECHNIQUE: Multiplanar, multiecho pulse sequences of the brain and surrounding
structures were obtained without intravenous contrast.

[Series 5: ax dwi_tracew · axial · 3.0mm · 0.65mm/px · z∈[-132,+10]mm · 4 of 48 slices shown]
[im 1/48]
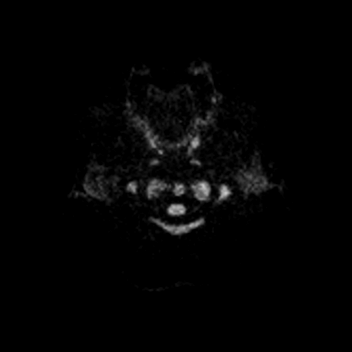
[im 16/48]
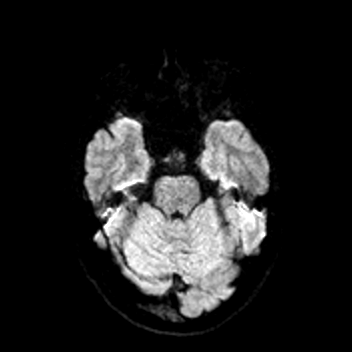
[im 32/48]
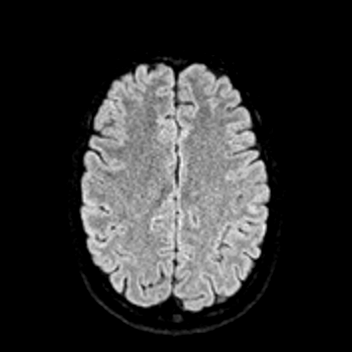
[im 48/48]
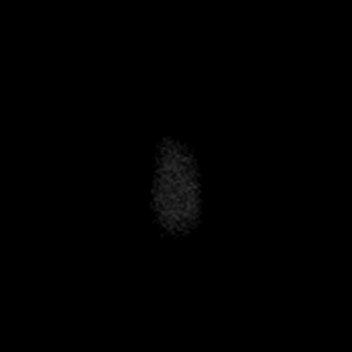

[Series 6: ax dwi_adc · axial · 3.0mm · 0.65mm/px · z∈[-132,+10]mm · 4 of 48 slices shown]
[im 1/48]
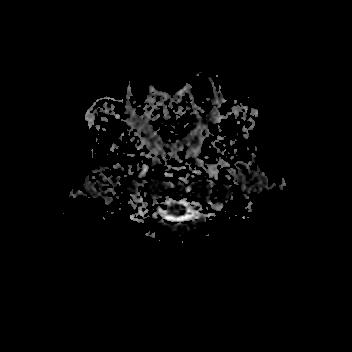
[im 16/48]
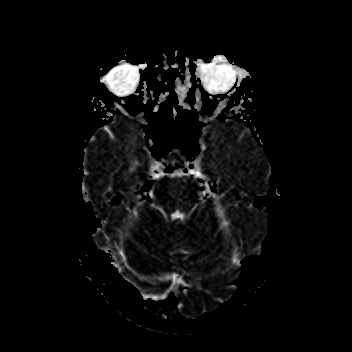
[im 32/48]
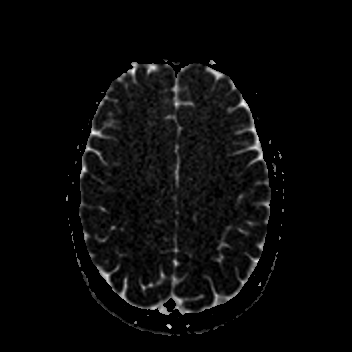
[im 48/48]
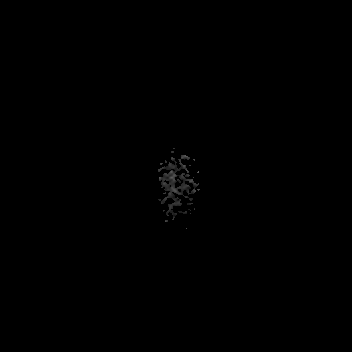

[Series 7: cor dwi_tracew · coronal · 5.0mm · 0.65mm/px · 3 of 38 slices shown]
[im 1/38]
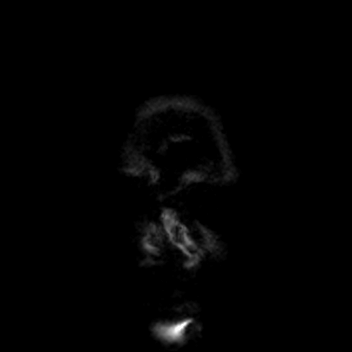
[im 19/38]
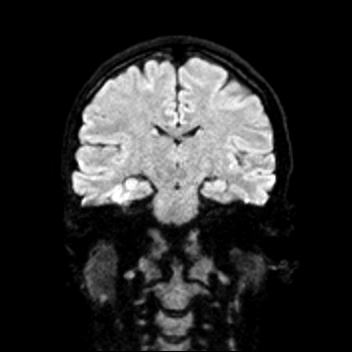
[im 38/38]
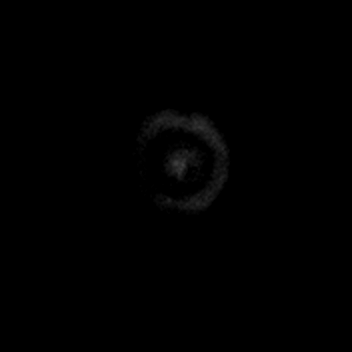

[Series 8: cor dwi_adc · coronal · 5.0mm · 0.65mm/px · 3 of 37 slices shown]
[im 1/37]
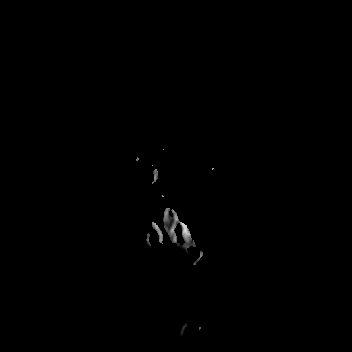
[im 19/37]
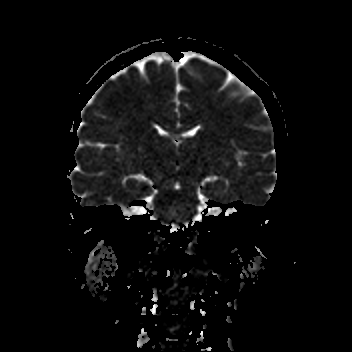
[im 37/37]
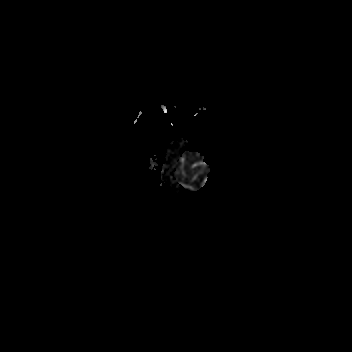

[Series 9: T1 · sagittal · 5.0mm · 0.62mm/px · 2 of 25 slices shown (1 of 2)]
[im 1/25]
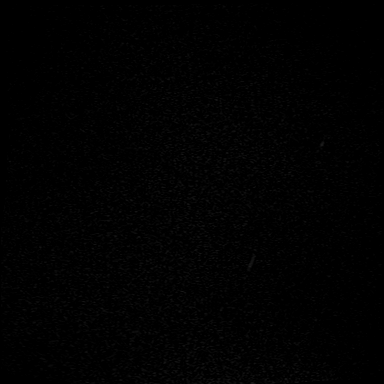
[im 25/25]
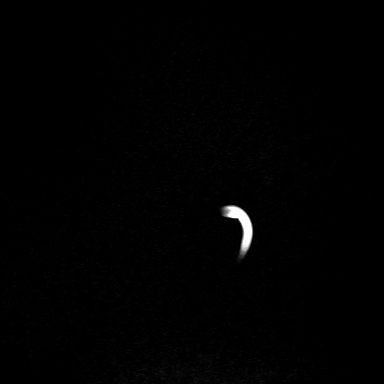

[Series 10: T2 · axial · 5.0mm · 0.53mm/px · z∈[-127,+10]mm · 2 of 26 slices shown (1 of 2)]
[im 1/26]
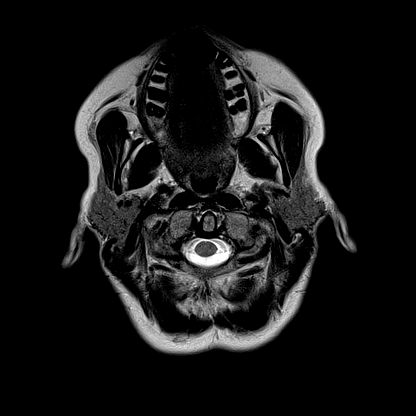
[im 26/26]
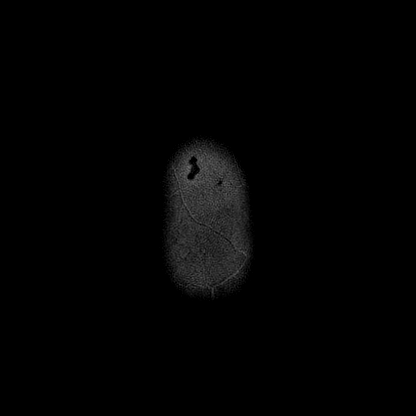

[Series 12: pha_images · axial · 3.0mm · 0.90mm/px · z∈[-142,+18]mm · 5 of 57 slices shown]
[im 1/57]
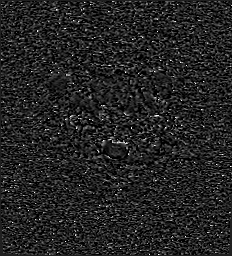
[im 15/57]
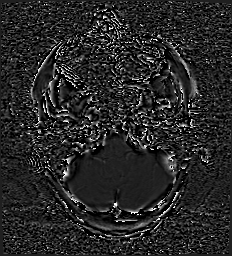
[im 29/57]
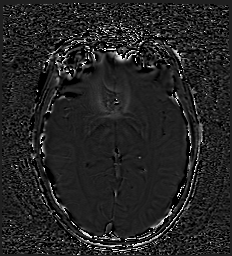
[im 43/57]
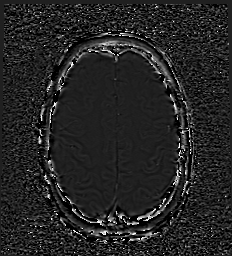
[im 57/57]
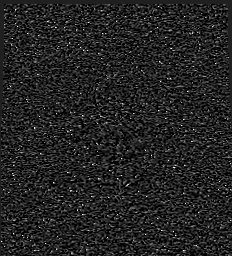

[Series 13: swi_images · axial · 3.0mm · 0.90mm/px · z∈[-142,+20]mm · 5 of 60 slices shown]
[im 1/60]
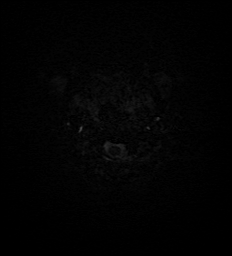
[im 15/60]
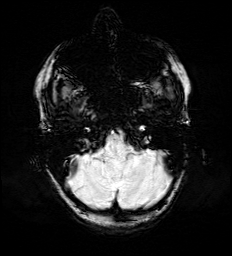
[im 30/60]
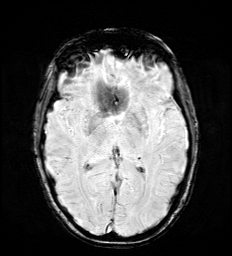
[im 45/60]
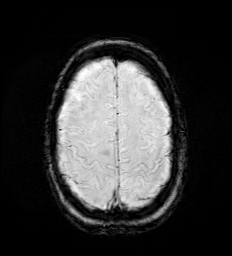
[im 60/60]
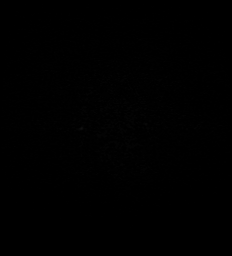

[Series 15: FLAIR · axial · 3.0mm · 0.53mm/px · z∈[-133,+15]mm · 4 of 55 slices shown]
[im 1/55]
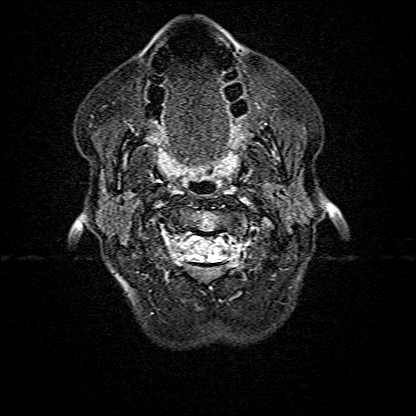
[im 19/55]
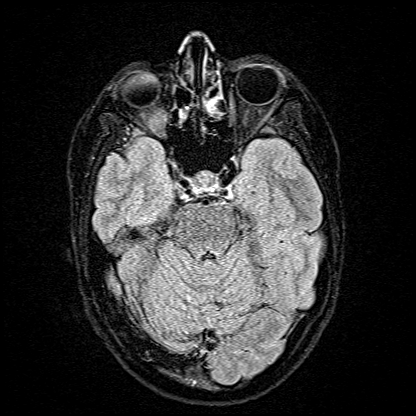
[im 37/55]
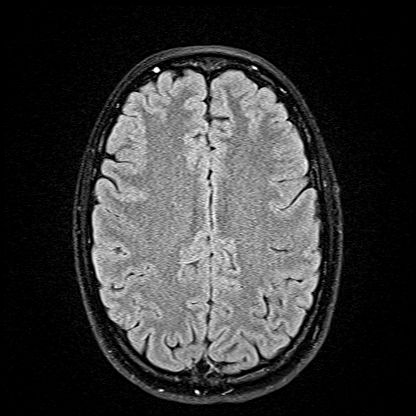
[im 55/55]
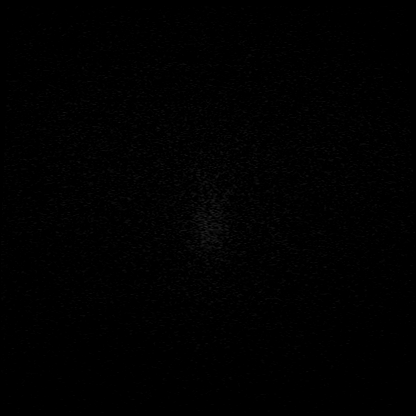

[Series 16: T1 · axial · 1.0mm · 0.98mm/px · z∈[-145,+15]mm · 14 of 176 slices shown (2 of 2)]
[im 1/176]
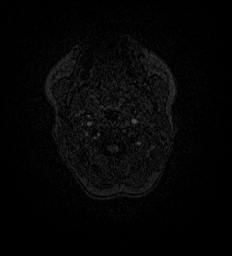
[im 14/176]
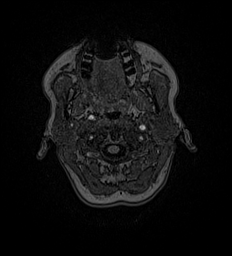
[im 27/176]
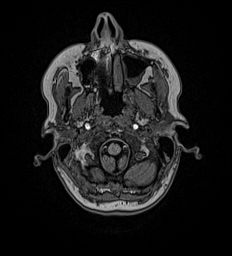
[im 41/176]
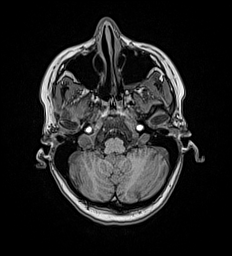
[im 54/176]
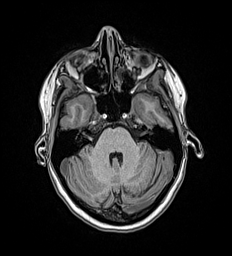
[im 68/176]
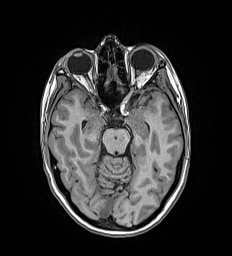
[im 81/176]
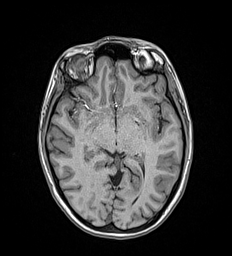
[im 95/176]
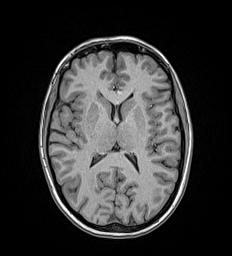
[im 108/176]
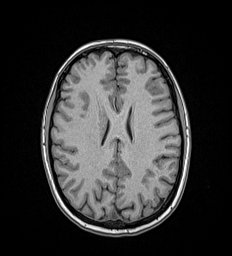
[im 122/176]
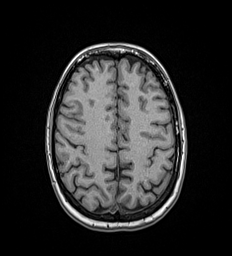
[im 135/176]
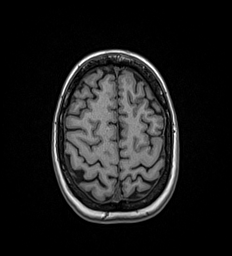
[im 149/176]
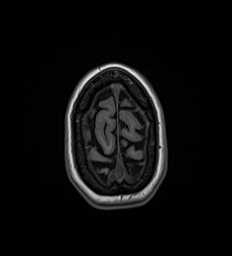
[im 162/176]
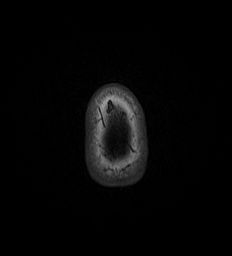
[im 176/176]
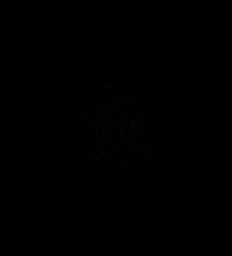

[Series 17: T2 · coronal · 5.0mm · 0.57mm/px · 2 of 29 slices shown (2 of 2)]
[im 1/29]
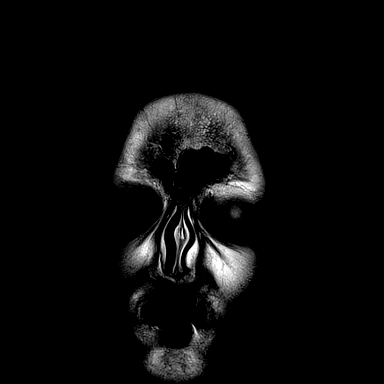
[im 29/29]
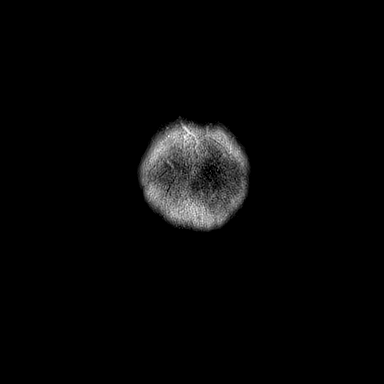

[48 of 48 positions shown; findings below may reference images not displayed]

FINDINGS: Brain: There is no acute infarction or intracranial hemorrhage.
There is no intracranial mass, mass effect, or edema. There is no
hydrocephalus or extra-axial fluid collection. Ventricles and sulci
are normal in size and configuration CT.

Vascular: Major vessel flow voids at the skull base are preserved.

Skull and upper cervical spine: Normal marrow signal is preserved.

Sinuses/Orbits: Patchy paranasal sinus mucosal thickening. Orbits
are unremarkable.

Other: Sella is unremarkable.  Mastoid air cells are clear.
IMPRESSION: Normal MRI of the brain.

## 2021-07-28 IMAGING — CT CT HEAD W/O CM
3 series · 16 of 46 positions shown, 19 images · non-contrast
Comparison: November 06, 2007.

CLINICAL DATA: Headache.

EXAM:
CT HEAD WITHOUT CONTRAST
TECHNIQUE: Contiguous axial images were obtained from the base of the skull
through the vertex without intravenous contrast.

[Series 3: head wo · axial · 0.43mm/px · z∈[-172,-52]mm · 10 of 29 slices shown, 13 images]
[im 3/29  brain]
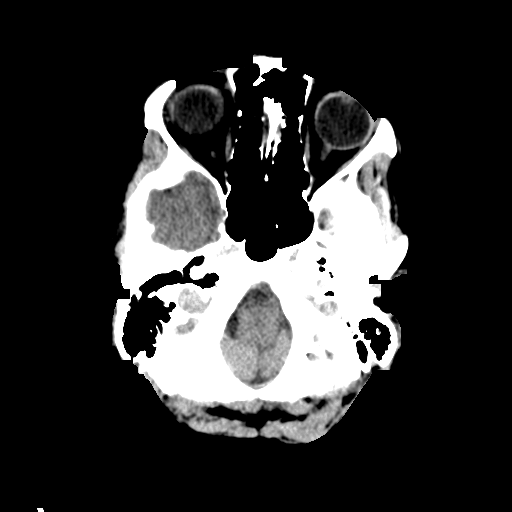
[im 3/29  bone]
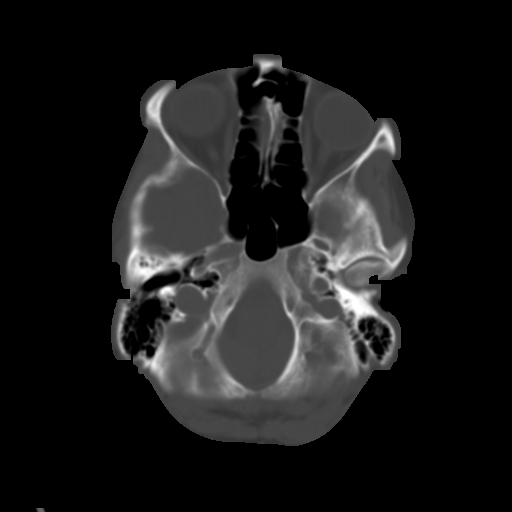
[im 6/29  brain]
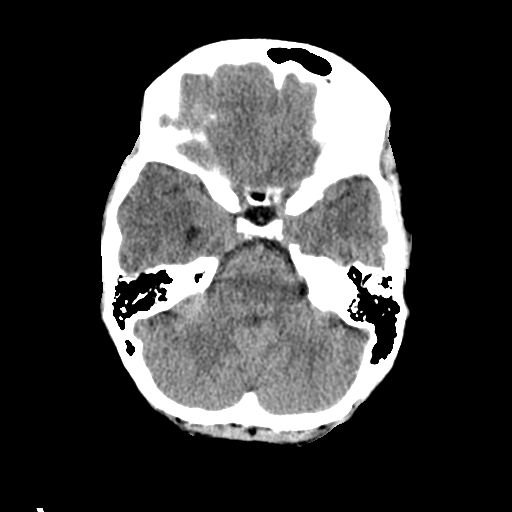
[im 8/29  brain]
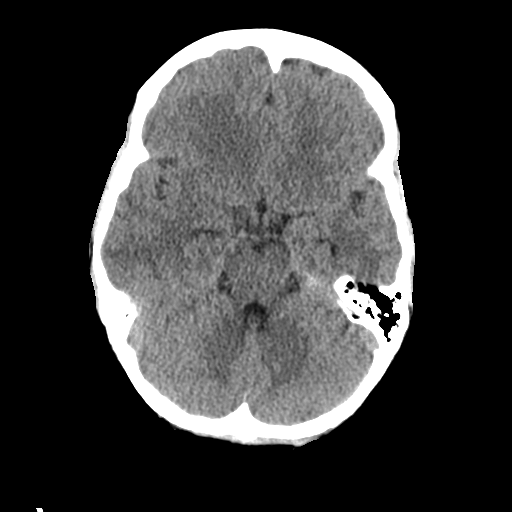
[im 11/29  brain]
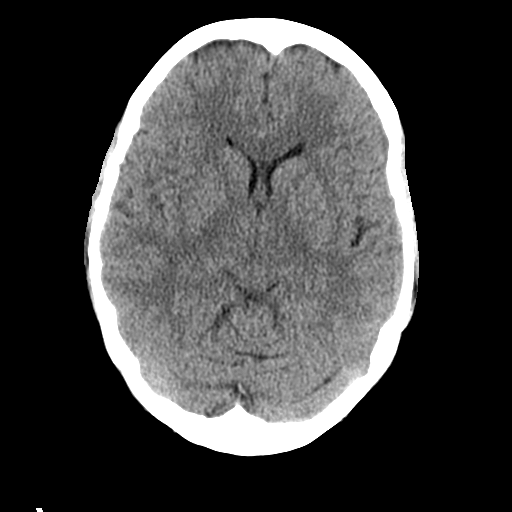
[im 14/29  brain]
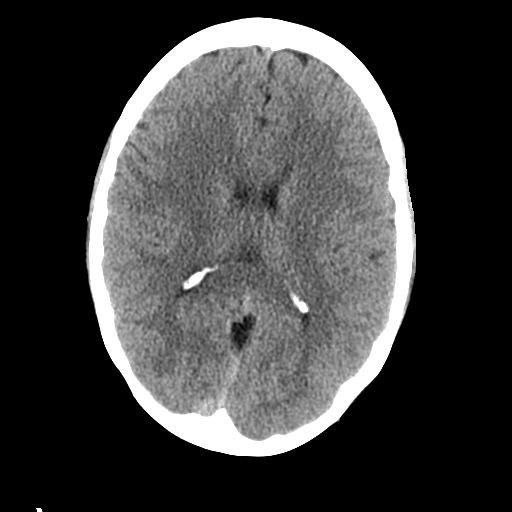
[im 14/29  bone]
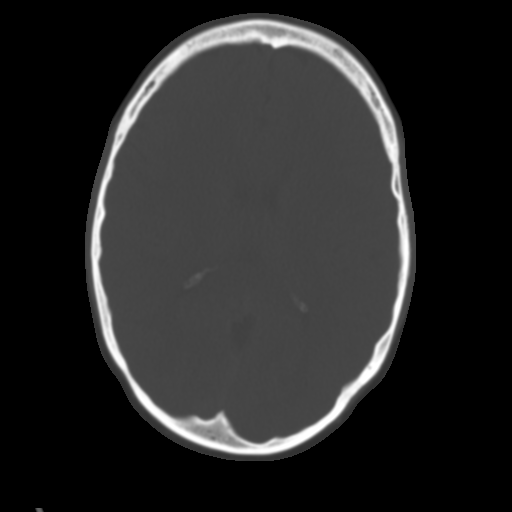
[im 16/29  brain]
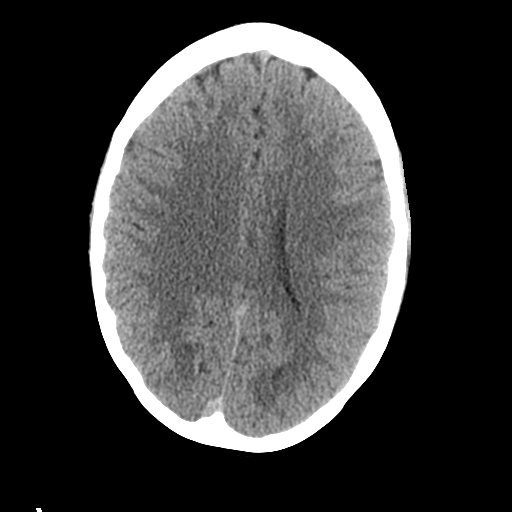
[im 19/29  brain]
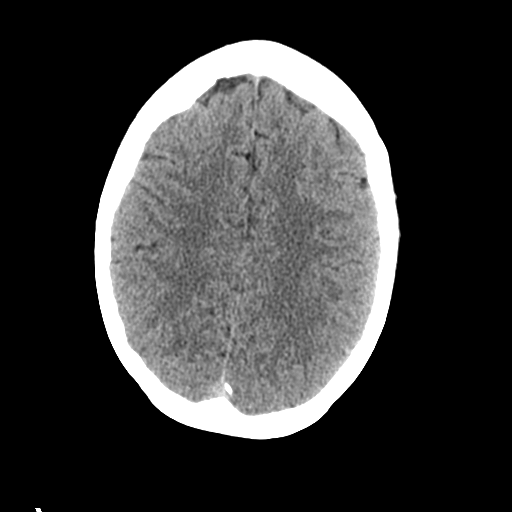
[im 22/29  brain]
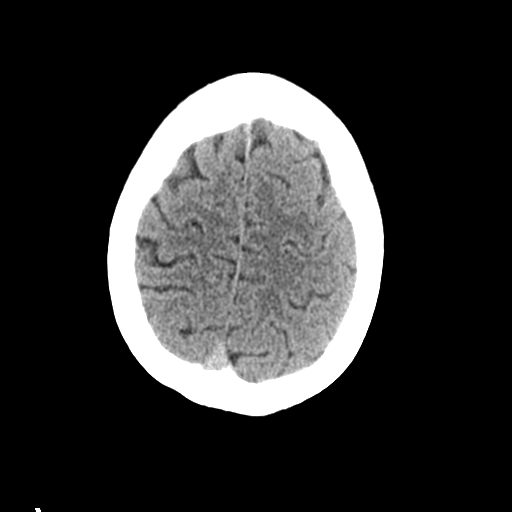
[im 24/29  brain]
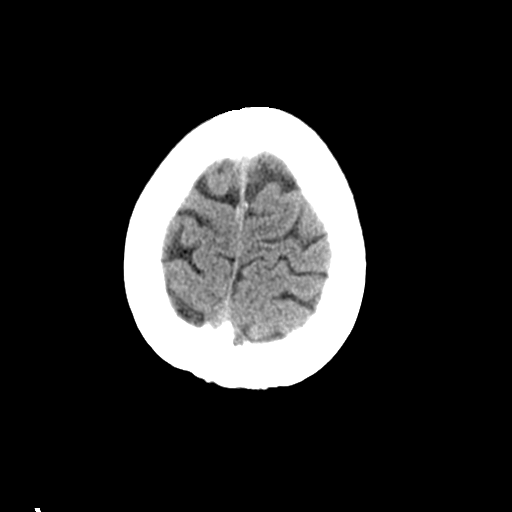
[im 24/29  bone]
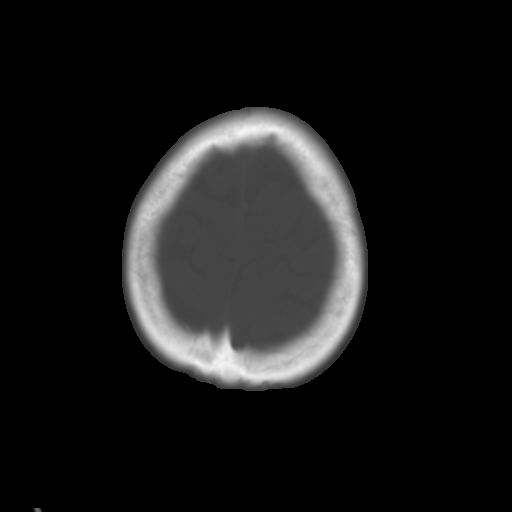
[im 27/29  brain]
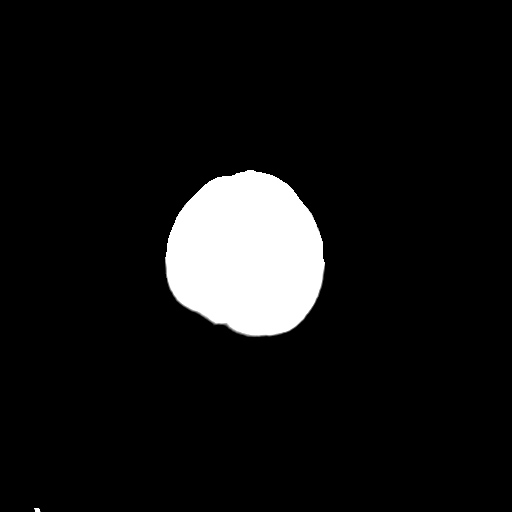

[Series 4: coronal soft tissue · coronal · 0.33mm/px · 3 of 68 slices shown]
[im 25/68  brain]
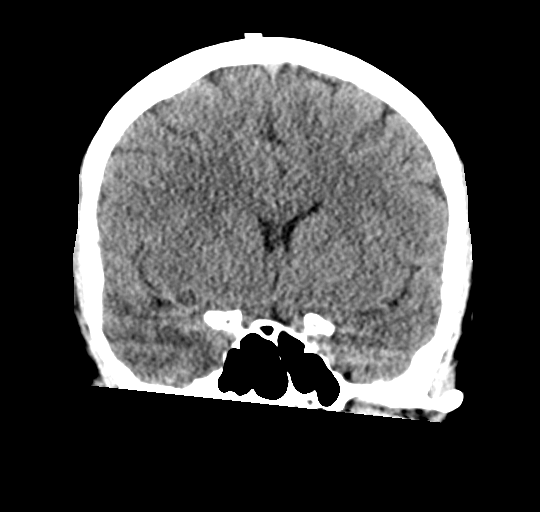
[im 31/68  brain]
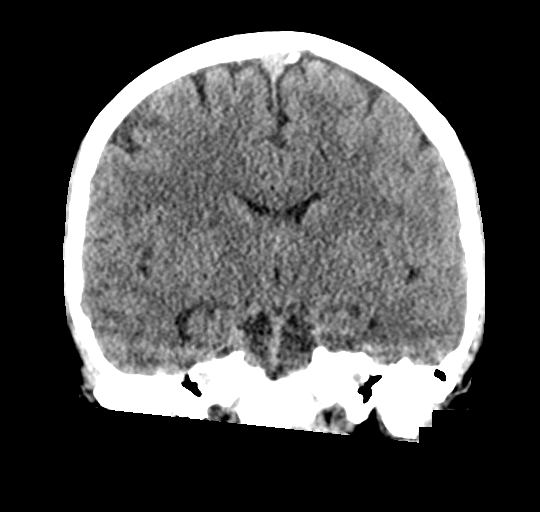
[im 37/68  brain]
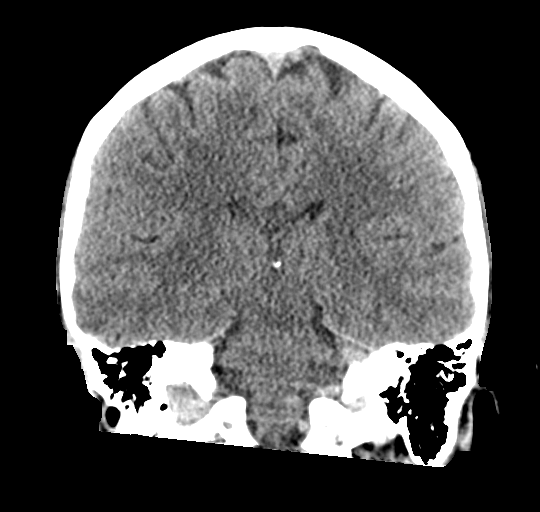

[Series 5: sagittal soft tissue · sagittal · 0.33mm/px · 3 of 54 slices shown]
[im 18/54  brain]
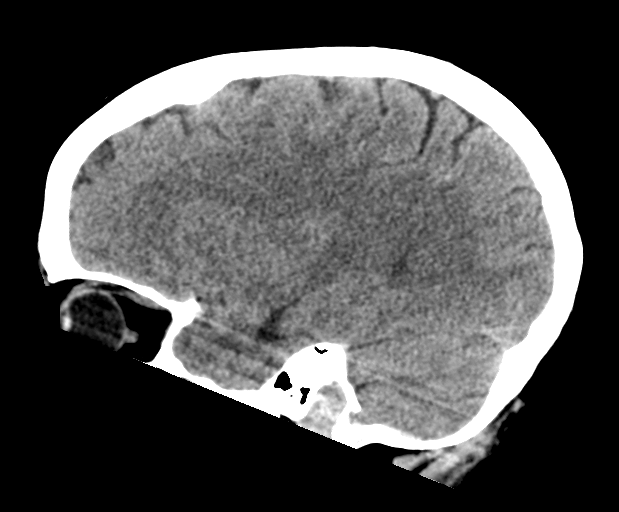
[im 27/54  brain]
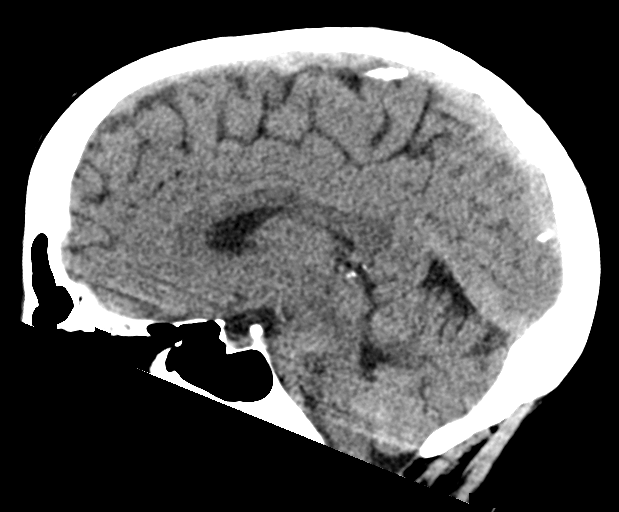
[im 36/54  brain]
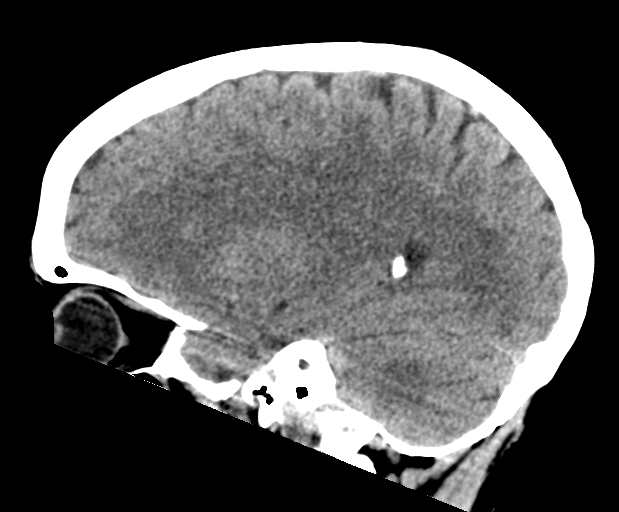

[16 of 46 positions shown; findings below may reference images not displayed]

FINDINGS: Brain: No evidence of acute infarction, hemorrhage, hydrocephalus,
extra-axial collection or mass lesion/mass effect.

Vascular: No hyperdense vessel or unexpected calcification.

Skull: Normal. Negative for fracture or focal lesion.

Sinuses/Orbits: Mild left ethmoid sinusitis is noted.

Other: None.
IMPRESSION: No acute intracranial abnormality seen.

## 2021-07-31 ENCOUNTER — Other Ambulatory Visit (HOSPITAL_COMMUNITY): Payer: Self-pay

## 2021-08-01 ENCOUNTER — Other Ambulatory Visit: Payer: Self-pay

## 2021-08-02 ENCOUNTER — Other Ambulatory Visit (HOSPITAL_COMMUNITY): Payer: Self-pay

## 2021-08-22 ENCOUNTER — Other Ambulatory Visit (HOSPITAL_COMMUNITY): Payer: Self-pay

## 2021-08-31 ENCOUNTER — Other Ambulatory Visit (HOSPITAL_COMMUNITY): Payer: Self-pay

## 2021-09-13 ENCOUNTER — Encounter: Payer: Self-pay | Admitting: Family Medicine

## 2021-09-13 ENCOUNTER — Other Ambulatory Visit: Payer: Self-pay

## 2021-09-13 ENCOUNTER — Ambulatory Visit: Payer: 59 | Admitting: Family Medicine

## 2021-09-13 VITALS — BP 120/80 | HR 106 | Temp 98.7°F | Ht 65.5 in | Wt 148.5 lb

## 2021-09-13 DIAGNOSIS — J029 Acute pharyngitis, unspecified: Secondary | ICD-10-CM

## 2021-09-13 DIAGNOSIS — J020 Streptococcal pharyngitis: Secondary | ICD-10-CM | POA: Diagnosis not present

## 2021-09-13 LAB — POCT RAPID STREP A (OFFICE): Rapid Strep A Screen: POSITIVE — AB

## 2021-09-13 MED ORDER — AMOXICILLIN 875 MG PO TABS
875.0000 mg | ORAL_TABLET | Freq: Two times a day (BID) | ORAL | 0 refills | Status: DC
  Filled 2021-09-13: qty 20, 10d supply, fill #0

## 2021-09-13 NOTE — Progress Notes (Signed)
? ? ?Caitlin Ator T. Myha Arizpe, MD, [facility] ?Therapist, music at [facility] ?[place] ?[place], [address] ? ?Phone: [phone]  FAX: [phone] ? ?Caitlin Burns - 41 y.o. female  MRN [id number]  Date of Birth: April 15, 1981 ? ?Date: 09/13/2021  PCP: Ria Bush, MD  Referral: Ria Bush, MD ? ?Chief Complaint  ?Patient presents with  ? Sore Throat  ?  Husband and son both positive for strep  ? Generalized Body Aches  ?   ?  ? Fever  ? Chills  ? ? ?This visit occurred during the SARS-CoV-2 public health emergency.  Safety protocols were in place, including screening questions prior to the visit, additional usage of staff PPE, and extensive cleaning of exam room while observing appropriate contact time as indicated for disinfecting solutions.  ? ?Subjective:  ? ?Caitlin Burns is a 41 y.o. very pleasant female patient with Body mass index is 24.34 kg/m?. who presents with the following: ? ?She is a very pleasant young lady and she presents with some very significant sore throat, achiness, fever and chills.  Temp is 98.7 today, but pulse is 106. ? ?She does have multiple family members with confirmed strep. ? ?She has no URI symptoms, nausea, vomiting, diarrhea, rashes or dysuria. ? ?Review of Systems is noted in the HPI, as appropriate ? ?Objective:  ? ?BP 120/80   Pulse (!) 106   Temp 98.7 ?F (37.1 ?C) (Oral)   Ht 5' 5.5" (1.664 m)   Wt 148 lb 8 oz (67.4 kg)   SpO2 99%   BMI 24.34 kg/m?  ? ? ?Gen: WDWN, NAD. Globally Non-toxic ?HEENT: Throat clear, w/o exudate, R TM clear, L TM - good landmarks, No fluid present. rhinnorhea.  MMM ?Frontal sinuses: NT ?Max sinuses: NT ?NECK: Anterior cervical  LAD is present ?CV: Regular rhythm, mildly tachycardic to 105 on exam, No M/G/R, cap refill <2 sec ?PULM: Breathing comfortably in no respiratory distress. no wheezing, crackles, rhonchi  ? ?Laboratory and Imaging Data: ?Results for orders placed or performed in visit on  09/13/21  ?POCT rapid strep A  ?Result Value Ref Range  ? Rapid Strep A Screen Positive (A) Negative  ?  ? ?Assessment and Plan:  ? ?  ICD-10-CM   ?1. Strep throat  J02.0   ?  ?2. Sore throat  J02.9 POCT rapid strep A  ?  ? ?Treat as such.  Supportive care. Amoxicillin 875 mg, 1 po BID, #20, 0 ref  ?Tylenol if needed for achiness or fever. ? ?Medication Management during today's office visit: ?Meds ordered this encounter  ?Medications  ? amoxicillin (AMOXIL) 875 MG tablet  ?  Sig: Take 1 tablet (875 mg total) by mouth 2 (two) times daily.  ?  Dispense:  20 tablet  ?  Refill:  0  ? ?Medications Discontinued During This Encounter  ?Medication Reason  ? sucralfate (CARAFATE) 1 g tablet Completed Course  ? ? ?Orders placed today for conditions managed today: ?Orders Placed This Encounter  ?Procedures  ? POCT rapid strep A  ? ? ?Follow-up if needed: No follow-ups on file. ? ?Dragon [employer] One speech-to-text software was used for transcription in this dictation.  Possible transcriptional errors can occur using Editor, commissioning.  ? ?Signed, ? ?Caitlin Troost T. Malyssa Maris, MD ? ? ?Outpatient Encounter Medications as of 09/13/2021  ?Medication Sig  ? Adalimumab (HUMIRA PEN) 40 MG/0.4ML PNKT Inject 40 mg subcutaneously every 14 (fourteen) days  ? albuterol (VENTOLIN HFA) 108 (90  Base) MCG/ACT inhaler Inhale 2 puffs into the lungs every 6 (six) hours as needed for wheezing or shortness of breath.  ? ALPRAZolam (XANAX) 0.5 MG tablet Take 1 tablet (0.5 mg total) by mouth 2 (two) times daily as needed.  ? amLODipine (NORVASC) 5 MG tablet Take 1 tablet (5 mg total) by mouth daily.  ? amoxicillin (AMOXIL) 875 MG tablet Take 1 tablet (875 mg total) by mouth 2 (two) times daily.  ? buPROPion (WELLBUTRIN XL) 150 MG 24 hr tablet TAKE 1 TABLET BY MOUTH DAILY AFTER BREAKFAST  ? butalbital-acetaminophen-caffeine (FIORICET) 50-325-40 MG tablet Take 1 tablet by mouth every 6 (six) hours as needed for headache.  ? cyclobenzaprine (FLEXERIL) 5 MG  tablet Take 1-2 tablets (5-10 mg total) by mouth 2 (two) times daily as needed (migraine headache - sedation precautions).  ? drospirenone-ethinyl estradiol (JASMIEL) 3-0.02 MG tablet TAKE 1 TABLET BY MOUTH DAILY, CONTINUOUS ACTIVE PILLS  ? fluticasone (FLOVENT HFA) 110 MCG/ACT inhaler Inhale 1 puff into the lungs in the morning and at bedtime.  ? lisdexamfetamine (VYVANSE) 60 MG capsule TAKE 1 CAPSULE BY MOUTH DAILY.  ? pantoprazole (PROTONIX) 40 MG tablet Take 1 tablet by mouth daily  ? Rimegepant Sulfate (NURTEC) 75 MG TBDP Take 75 mg by mouth daily as needed. For migraines. Take as close to onset of migraine as possible. One daily maximum.  ? pantoprazole (PROTONIX) 20 MG tablet Take 1 tablet (20 mg total) by mouth 2 (two) times daily for 14 days.  ? [DISCONTINUED] sucralfate (CARAFATE) 1 g tablet Take 1 tablet (1 g total) by mouth 4 (four) times daily -  with meals and at bedtime.  ? ?No facility-administered encounter medications on file as of 09/13/2021.  ?  ?

## 2021-09-25 ENCOUNTER — Other Ambulatory Visit (HOSPITAL_COMMUNITY): Payer: Self-pay

## 2021-09-25 ENCOUNTER — Other Ambulatory Visit: Payer: Self-pay

## 2021-09-26 ENCOUNTER — Other Ambulatory Visit: Payer: Self-pay

## 2021-09-26 ENCOUNTER — Telehealth: Payer: Self-pay

## 2021-09-26 NOTE — Telephone Encounter (Signed)
Prior auth started for Nurtec '75MG'$  dispersible tablets. Bennett Scrape Key: [id number] - PA Case ID: [id number] - Rx #: [id number] Waiting for determination.

## 2021-09-28 NOTE — Telephone Encounter (Signed)
Prior auth for Nurtec '75MG'$  dispersible tablets states: The prior authorization request has been cancelled. No reason given.  Please let me know if I need to do anything else on this one.

## 2021-09-29 ENCOUNTER — Other Ambulatory Visit (HOSPITAL_COMMUNITY): Payer: Self-pay

## 2021-09-29 NOTE — Telephone Encounter (Signed)
Noted  

## 2021-09-29 NOTE — Telephone Encounter (Signed)
I see there's a phn #, in the PA, to call for questions or additional info.  I would try that to see at least why it was canceled and see if it can be resubmitted.

## 2021-09-29 NOTE — Telephone Encounter (Signed)
We received correspondence from [place] re:  Nurtec.  This prior Josem Kaufmann was partially approved.  The authorization is effective for a maximum of 6 fill(s) from 09/28/2021 to 03/27/2022, as long as you are enrolled as a member of your current health plan...  Please note that  a prior authorization has been approved for Nurtec ODT '75mg'$  tablets for 6 months with a quantity limit of 18 tablets per 30 days.

## 2021-10-04 ENCOUNTER — Encounter: Payer: Self-pay | Admitting: Pharmacist

## 2021-10-04 ENCOUNTER — Other Ambulatory Visit: Payer: Self-pay

## 2021-10-09 ENCOUNTER — Other Ambulatory Visit: Payer: Self-pay

## 2021-10-09 ENCOUNTER — Other Ambulatory Visit: Payer: Self-pay | Admitting: Adult Health

## 2021-10-09 DIAGNOSIS — F411 Generalized anxiety disorder: Secondary | ICD-10-CM

## 2021-10-09 MED ORDER — ALPRAZOLAM 0.5 MG PO TABS
0.5000 mg | ORAL_TABLET | Freq: Two times a day (BID) | ORAL | 0 refills | Status: DC | PRN
  Filled 2021-10-09 – 2021-11-02 (×3): qty 180, 90d supply, fill #0

## 2021-10-09 NOTE — Telephone Encounter (Signed)
Filled 3/29 appt due in October

## 2021-10-16 ENCOUNTER — Other Ambulatory Visit: Payer: Self-pay

## 2021-10-16 ENCOUNTER — Other Ambulatory Visit: Payer: Self-pay | Admitting: Adult Health

## 2021-10-16 DIAGNOSIS — F902 Attention-deficit hyperactivity disorder, combined type: Secondary | ICD-10-CM

## 2021-10-17 ENCOUNTER — Other Ambulatory Visit: Payer: Self-pay

## 2021-10-17 MED ORDER — LISDEXAMFETAMINE DIMESYLATE 60 MG PO CAPS
ORAL_CAPSULE | ORAL | 0 refills | Status: DC
  Filled 2021-10-17: qty 90, 90d supply, fill #0

## 2021-10-17 NOTE — Telephone Encounter (Signed)
Filled 3/13 appt due in September

## 2021-10-19 ENCOUNTER — Other Ambulatory Visit (HOSPITAL_COMMUNITY): Payer: Self-pay

## 2021-10-23 ENCOUNTER — Other Ambulatory Visit (HOSPITAL_COMMUNITY): Payer: Self-pay

## 2021-10-27 ENCOUNTER — Other Ambulatory Visit: Payer: Self-pay

## 2021-10-31 ENCOUNTER — Telehealth: Payer: Self-pay

## 2021-11-02 ENCOUNTER — Other Ambulatory Visit: Payer: Self-pay

## 2021-11-02 NOTE — Telephone Encounter (Signed)
Mychart message sent also today

## 2021-11-02 NOTE — Telephone Encounter (Signed)
Patient responded via mychart, she forgot and is on vacation this week she will try and come by next week

## 2021-11-14 ENCOUNTER — Other Ambulatory Visit (HOSPITAL_COMMUNITY): Payer: Self-pay

## 2021-11-14 DIAGNOSIS — M458 Ankylosing spondylitis sacral and sacrococcygeal region: Secondary | ICD-10-CM | POA: Diagnosis not present

## 2021-11-14 DIAGNOSIS — I7300 Raynaud's syndrome without gangrene: Secondary | ICD-10-CM | POA: Diagnosis not present

## 2021-11-14 DIAGNOSIS — Z7960 Long term (current) use of unspecified immunomodulators and immunosuppressants: Secondary | ICD-10-CM | POA: Diagnosis not present

## 2021-11-17 ENCOUNTER — Other Ambulatory Visit: Payer: 59

## 2021-11-17 DIAGNOSIS — A048 Other specified bacterial intestinal infections: Secondary | ICD-10-CM

## 2021-11-21 LAB — HELICOBACTER PYLORI  SPECIAL ANTIGEN
MICRO NUMBER:: 13648723
SPECIMEN QUALITY: ADEQUATE

## 2021-11-22 ENCOUNTER — Other Ambulatory Visit (HOSPITAL_COMMUNITY): Payer: Self-pay

## 2021-11-25 ENCOUNTER — Encounter: Payer: Self-pay | Admitting: Family Medicine

## 2021-12-15 ENCOUNTER — Other Ambulatory Visit: Payer: Self-pay | Admitting: Internal Medicine

## 2021-12-18 ENCOUNTER — Other Ambulatory Visit: Payer: Self-pay

## 2021-12-19 ENCOUNTER — Other Ambulatory Visit: Payer: Self-pay

## 2021-12-21 ENCOUNTER — Ambulatory Visit: Payer: 59 | Attending: Family Medicine | Admitting: Pharmacist

## 2021-12-21 ENCOUNTER — Other Ambulatory Visit (HOSPITAL_COMMUNITY): Payer: Self-pay

## 2021-12-21 DIAGNOSIS — Z79899 Other long term (current) drug therapy: Secondary | ICD-10-CM

## 2021-12-21 MED ORDER — HUMIRA (2 PEN) 40 MG/0.4ML ~~LOC~~ AJKT
AUTO-INJECTOR | SUBCUTANEOUS | 5 refills | Status: DC
  Filled 2021-12-21: qty 2, 28d supply, fill #0
  Filled 2022-01-15 – 2022-01-16 (×2): qty 2, 28d supply, fill #1
  Filled 2022-02-15: qty 2, 28d supply, fill #2
  Filled 2022-03-15: qty 2, 28d supply, fill #3
  Filled 2022-04-17 – 2022-04-20 (×3): qty 2, 28d supply, fill #4
  Filled 2022-05-11 – 2022-05-17 (×2): qty 2, 28d supply, fill #5

## 2021-12-21 MED ORDER — HUMIRA (2 PEN) 40 MG/0.4ML ~~LOC~~ AJKT: AUTO-INJECTOR | SUBCUTANEOUS | 5 refills | Status: DC

## 2021-12-21 NOTE — Progress Notes (Signed)
  S: Patient presents for review of their specialty medication therapy.  Patient is Humira for ankylosing spondylitis. Patient is managed by Dr. Posey Pronto for this.   Adherence: confirms  Efficacy: doing well, controlling her pain  Dosing:  Ankylosing spondylitis: SubQ: 40 mg every other week (may continue methotrexate, other nonbiologic DMARDS, corticosteroids, NSAIDs and/or analgesics)  Dose adjustments: Renal: no dose adjustments (has not been studied) Hepatic: no dose adjustments (has not been studied)  Drug-drug interactions: none identified  Screening: TB test: negative per [facility] on 11/08/2020 Hepatitis: negative per [facility]where on 11/08/2020  Monitoring: S/sx of infection: none CBC: wnl per [facility] 11/14/2021 S/sx of hypersensitivity: none S/sx of malignancy: none S/sx of heart failure: none  Other side effects: none  O:     Lab Results  Component Value Date   WBC 6.9 06/16/2021   HGB 13.3 06/16/2021   HCT 37.0 06/16/2021   MCV 85 06/16/2021   PLT 348 06/16/2021      Chemistry      Component Value Date/Time   NA 141 06/16/2021 1602   K 3.5 06/16/2021 1602   CL 101 06/16/2021 1602   CO2 22 06/16/2021 1602   BUN 9 06/16/2021 1602   CREATININE 0.76 06/16/2021 1602      Component Value Date/Time   CALCIUM 9.5 06/16/2021 1602   ALKPHOS 44 06/16/2021 1602   AST 13 06/16/2021 1602   ALT 11 06/16/2021 1602   BILITOT 0.3 06/16/2021 1602       A/P: 1. Medication review: Patient is taking Humira for Ankylosing Spondylitis. Reviewed the medication with the patient, including the following: Humira is a TNF blocking agent indicated for ankylosing spondylitis, Crohn's disease, Hidradenitis suppurativa, psoriatic arthritis, plaque psoriasis, ulcerative colitis, and uveitis. Patient educated on purpose, proper use and potential adverse effects of Humira. Possible adverse effects are increased risk of infections, headache, and injection site  reactions. There is the possibility of an increased risk of malignancy but it is not well understood if this increased risk is due to there medication or the disease state. There are rare cases of pancytopenia and aplastic anemia. For SubQ injection at separate sites in the thigh or lower abdomen (avoiding areas within 2 inches of navel); rotate injection sites. May leave at room temperature for ~15 to 30 minutes prior to use; do not remove cap or cover while allowing product to reach room temperature. Do not use if solution is discolored or contains particulate matter. Do not administer to skin which is red, tender, bruised, hard, or that has scars, stretch marks, or psoriasis plaques. Prefilled pens and syringes are available for use by patients and the full amount of the syringe should be injected (self-administration); the vial is intended for institutional use only. Vials do not contain a preservative; discard unused portion. No recommendations for any changes at this time.   Benard Halsted, PharmD, [facility] [place], [place] [phone]

## 2021-12-22 ENCOUNTER — Other Ambulatory Visit (HOSPITAL_COMMUNITY): Payer: Self-pay

## 2021-12-26 ENCOUNTER — Other Ambulatory Visit: Payer: Self-pay

## 2021-12-26 ENCOUNTER — Other Ambulatory Visit (HOSPITAL_COMMUNITY): Payer: Self-pay

## 2021-12-26 ENCOUNTER — Emergency Department: Payer: 59

## 2021-12-26 ENCOUNTER — Encounter: Payer: Self-pay | Admitting: Emergency Medicine

## 2021-12-26 ENCOUNTER — Emergency Department
Admission: EM | Admit: 2021-12-26 | Discharge: 2021-12-26 | Disposition: A | Discharge status: Home / Self Care | Payer: 59 | Attending: Emergency Medicine | Admitting: Emergency Medicine

## 2021-12-26 DIAGNOSIS — R1031 Right lower quadrant pain: Secondary | ICD-10-CM | POA: Diagnosis not present

## 2021-12-26 DIAGNOSIS — I10 Essential (primary) hypertension: Secondary | ICD-10-CM | POA: Diagnosis not present

## 2021-12-26 DIAGNOSIS — E119 Type 2 diabetes mellitus without complications: Secondary | ICD-10-CM | POA: Diagnosis not present

## 2021-12-26 LAB — CBC WITH DIFFERENTIAL/PLATELET
Abs Immature Granulocytes: 0.02 10*3/uL (ref 0.00–0.07)
Basophils Absolute: 0.1 10*3/uL (ref 0.0–0.1)
Basophils Relative: 1 %
Eosinophils Absolute: 0.2 10*3/uL (ref 0.0–0.5)
Eosinophils Relative: 2 %
HCT: 37.2 % (ref 36.0–46.0)
Hemoglobin: 13 g/dL (ref 12.0–15.0)
Immature Granulocytes: 0 %
Lymphocytes Relative: 42 %
Lymphs Abs: 4.1 10*3/uL — ABNORMAL HIGH (ref 0.7–4.0)
MCH: 30 pg (ref 26.0–34.0)
MCHC: 34.9 g/dL (ref 30.0–36.0)
MCV: 85.7 fL (ref 80.0–100.0)
Monocytes Absolute: 0.6 10*3/uL (ref 0.1–1.0)
Monocytes Relative: 6 %
Neutro Abs: 4.8 10*3/uL (ref 1.7–7.7)
Neutrophils Relative %: 49 %
Platelets: 328 10*3/uL (ref 150–400)
RBC: 4.34 MIL/uL (ref 3.87–5.11)
RDW: 11.9 % (ref 11.5–15.5)
WBC: 9.7 10*3/uL (ref 4.0–10.5)
nRBC: 0 % (ref 0.0–0.2)

## 2021-12-26 LAB — POC URINE PREG, ED: Preg Test, Ur: NEGATIVE

## 2021-12-26 LAB — COMPREHENSIVE METABOLIC PANEL
ALT: 8 U/L (ref 0–44)
AST: 13 U/L — ABNORMAL LOW (ref 15–41)
Albumin: 4.1 g/dL (ref 3.5–5.0)
Alkaline Phosphatase: 40 U/L (ref 38–126)
Anion gap: 8 (ref 5–15)
BUN: 14 mg/dL (ref 6–20)
CO2: 25 mmol/L (ref 22–32)
Calcium: 9.3 mg/dL (ref 8.9–10.3)
Chloride: 104 mmol/L (ref 98–111)
Creatinine, Ser: 0.7 mg/dL (ref 0.44–1.00)
GFR, Estimated: >60 mL/min (ref >60)
Glucose, Bld: 118 mg/dL — ABNORMAL HIGH (ref 70–99)
Potassium: 3.4 mmol/L — ABNORMAL LOW (ref 3.5–5.1)
Sodium: 137 mmol/L (ref 135–145)
Total Bilirubin: 0.6 mg/dL (ref 0.3–1.2)
Total Protein: 8.2 g/dL — ABNORMAL HIGH (ref 6.5–8.1)

## 2021-12-26 LAB — URINALYSIS, ROUTINE W REFLEX MICROSCOPIC
Bilirubin Urine: NEGATIVE
Glucose, UA: NEGATIVE mg/dL
Hgb urine dipstick: NEGATIVE
Ketones, ur: NEGATIVE mg/dL
Leukocytes,Ua: NEGATIVE
Nitrite: NEGATIVE
Protein, ur: NEGATIVE mg/dL
Specific Gravity, Urine: 1.024 (ref 1.005–1.030)
pH: 5 (ref 5.0–8.0)

## 2021-12-26 LAB — LIPASE, BLOOD: Lipase: 39 U/L (ref 11–51)

## 2021-12-26 MED ORDER — IOHEXOL 300 MG/ML  SOLN
100.0000 mL | Freq: Once | INTRAMUSCULAR | Status: AC | PRN
  Administered 2021-12-26: 100 mL via INTRAVENOUS

## 2021-12-26 NOTE — ED Provider Triage Note (Signed)
  Emergency Medicine Provider Triage Evaluation Note  Caitlin Burns , a 41 y.o.female,  was evaluated in triage.  Pt complains of right lower quadrant pain that started suddenly a few hours ago.  She states the pain has been migrating back-and-forth between right lower quadrant and her umbilical region.  Endorses some relief in her pain since being here, but is concerned for appendicitis.   Review of Systems  Positive: Abdominal pain Negative: Denies fever, chest pain, vomiting  Physical Exam  There were no vitals filed for this visit. Gen:   Awake, no distress   Resp:  Normal effort  MSK:   Moves extremities without difficulty  Other:    Medical Decision Making  Given the patient's initial medical screening exam, the following diagnostic evaluation has been ordered. The patient will be placed in the appropriate treatment space, once one is available, to complete the evaluation and treatment. I have discussed the plan of care with the patient and I have advised the patient that an ED physician or mid-level practitioner will reevaluate their condition after the test results have been received, as the results may give them additional insight into the type of treatment they may need.    Diagnostics: Labs, UA, abdominal/pelvic CT  Treatments: none immediately   Teodoro Spray, [place] 12/26/21 1841

## 2021-12-26 NOTE — Discharge Instructions (Signed)
Take MiraLAX once daily for the next 7 days.

## 2021-12-26 NOTE — ED Triage Notes (Signed)
Pt via POV from home. Pt c/o RLQ pain that started around 6:00pm this evening. States it was sudden onset. Denies NVD. Denies fevers. Denies any abd surgeries. Denies urinary symptoms. Pt is A&Ox4 and NAD

## 2021-12-26 NOTE — ED Triage Notes (Signed)
First Nurse note:  C/o RLQ abd pain.  AAOx3.  Skin warm and dry.  Posture upright and gait steady NAD

## 2021-12-26 NOTE — ED Provider Notes (Signed)
[facility] Provider Note  Patient Contact: 9:31 PM (approximate)   History   Abdominal Pain   HPI  Caitlin Burns is a 41 y.o. female with a history of diabetes and hypertension, presents to the emergency department with acute right lower quadrant abdominal pain that started around 6:00 PM.  Patient states that her pain is improved while waiting in the emergency department.  No associated fever, chills, vomiting or diarrhea.  Patient still has her appendix.  She states that she does have a history of ovarian cyst when she was a teenager but none in adulthood.  She denies chest pain, chest tightness, shortness of breath.  No dysuria, hematuria or increased urinary frequency.  No prior history of nephrolithiasis.      Physical Exam   Triage Vital Signs: ED Triage Vitals  Enc Vitals Group     BP 12/26/21 1850 (!) 153/101     Pulse Rate 12/26/21 1850 85     Resp 12/26/21 1850 18     Temp 12/26/21 1850 98.1 F (36.7 C)     Temp Source 12/26/21 1850 Oral     SpO2 12/26/21 1850 100 %     Weight 12/26/21 1848 140 lb (63.5 kg)     Height 12/26/21 1848 '5\' 6"'$  (1.676 m)     Head Circumference --      Peak Flow --      Pain Score 12/26/21 1847 3     Pain Loc --      Pain Edu? --      Excl. in [place]? --     Most recent vital signs: Vitals:   12/26/21 1850  BP: (!) 153/101  Pulse: 85  Resp: 18  Temp: 98.1 F (36.7 C)  SpO2: 100%     General: Alert and in no acute distress. Eyes:  PERRL. EOMI. Head: No acute traumatic findings ENT:      Nose: No congestion/rhinnorhea.      Mouth/Throat: Mucous membranes are moist.  Neck: No stridor. No cervical spine tenderness to palpation. Cardiovascular:  Good peripheral perfusion Respiratory: Normal respiratory effort without tachypnea or retractions. Lungs CTAB. Good air entry to the bases with no decreased or absent breath sounds. Gastrointestinal: Bowel sounds 4 quadrants.  Soft and tender to palpation.   Patient has guarding.  No palpable masses. No distention. No CVA tenderness. Musculoskeletal: Full range of motion to all extremities.  Neurologic:  No gross focal neurologic deficits are appreciated.  Skin:   No rash noted Other:   ED Results / Procedures / Treatments   Labs (all labs ordered are listed, but only abnormal results are displayed) Labs Reviewed  COMPREHENSIVE METABOLIC PANEL - Abnormal; Notable for the following components:      Result Value   Potassium 3.4 (*)    Glucose, Bld 118 (*)    Total Protein 8.2 (*)    AST 13 (*)    All other components within normal limits  CBC WITH DIFFERENTIAL/PLATELET - Abnormal; Notable for the following components:   Lymphs Abs 4.1 (*)    All other components within normal limits  URINALYSIS, ROUTINE W REFLEX MICROSCOPIC - Abnormal; Notable for the following components:   Color, Urine YELLOW (*)    APPearance HAZY (*)    All other components within normal limits  LIPASE, BLOOD  POC URINE PREG, ED       RADIOLOGY  I personally viewed and evaluated these images as part of my medical decision making,  as well as reviewing the written report by the radiologist.  ED Provider Interpretation: Patient has large stool burden in right lower quadrant without evidence of ovarian cyst or appendicitis.   PROCEDURES:  Critical Care performed: No  Procedures   MEDICATIONS ORDERED IN ED: Medications  iohexol (OMNIPAQUE) 300 MG/ML solution 100 mL (100 mLs Intravenous Contrast Given 12/26/21 2014)     IMPRESSION / MDM / ASSESSMENT AND PLAN / ED COURSE  I reviewed the triage vital signs and the nursing notes.                              Assessment and plan Abdominal pain 41 year old female presents to the emergency department with right lower quadrant abdominal pain that started tonight.  Patient was hypertensive at triage but vital signs otherwise reassuring.  She was alert and nontoxic-appearing.  She did have right lower  quadrant tenderness with guarding on exam.  Patient had very mild hypokalemia on CMP but CBC was reassuring without leukocytosis.  Lipase within reference range.  Urinalysis shows no signs of UTI or hematuria that would suggest nephrolithiasis.  CT abdomen and pelvis indicates large stool burden in the right lower quadrant without evidence of appendicitis or other acute abnormality.  I recommended daily MiraLAX, increase hydration, ambulation and intake of soluble fiber.  Return precautions were given to return with new or worsening symptoms.      FINAL CLINICAL IMPRESSION(S) / ED DIAGNOSES   Final diagnoses:  Right lower quadrant abdominal pain     Rx / DC Orders   ED Discharge Orders     None        Note:  This document was prepared using Dragon voice recognition software and may include unintentional dictation errors.   Vallarie Mare [place], Hershal Coria 12/26/21 2135    Blake Divine, MD 12/26/21 2241

## 2021-12-31 ENCOUNTER — Other Ambulatory Visit: Payer: Self-pay

## 2022-01-01 ENCOUNTER — Other Ambulatory Visit: Payer: Self-pay

## 2022-01-02 ENCOUNTER — Other Ambulatory Visit: Payer: Self-pay

## 2022-01-10 ENCOUNTER — Ambulatory Visit: Payer: 59 | Admitting: Adult Health

## 2022-01-10 ENCOUNTER — Ambulatory Visit (INDEPENDENT_AMBULATORY_CARE_PROVIDER_SITE_OTHER): Payer: 59 | Admitting: Adult Health

## 2022-01-10 ENCOUNTER — Encounter: Payer: Self-pay | Admitting: Adult Health

## 2022-01-10 ENCOUNTER — Other Ambulatory Visit: Payer: Self-pay

## 2022-01-10 DIAGNOSIS — F410 Panic disorder [episodic paroxysmal anxiety] without agoraphobia: Secondary | ICD-10-CM | POA: Diagnosis not present

## 2022-01-10 DIAGNOSIS — F411 Generalized anxiety disorder: Secondary | ICD-10-CM

## 2022-01-10 DIAGNOSIS — F902 Attention-deficit hyperactivity disorder, combined type: Secondary | ICD-10-CM | POA: Diagnosis not present

## 2022-01-10 MED ORDER — LISDEXAMFETAMINE DIMESYLATE 60 MG PO CAPS
ORAL_CAPSULE | ORAL | 0 refills | Status: DC
  Filled 2022-01-10 – 2022-01-15 (×2): qty 90, 90d supply, fill #0

## 2022-01-10 MED ORDER — ALPRAZOLAM 0.5 MG PO TABS
0.5000 mg | ORAL_TABLET | Freq: Two times a day (BID) | ORAL | 0 refills | Status: DC | PRN
  Filled 2022-01-10 – 2022-01-29 (×4): qty 180, 90d supply, fill #0

## 2022-01-10 MED ORDER — BUPROPION HCL ER (XL) 150 MG PO TB24
ORAL_TABLET | ORAL | 3 refills | Status: DC
  Filled 2022-01-10 – 2022-03-22 (×2): qty 90, 90d supply, fill #0
  Filled 2022-06-25: qty 90, 90d supply, fill #1
  Filled 2022-09-17: qty 90, 90d supply, fill #2

## 2022-01-10 NOTE — Progress Notes (Signed)
Caitlin Burns [id number] [birth year] 41 y.o.  Subjective:   Patient ID:  Caitlin Burns is a 41 y.o. (DOB Jul 04, 1980) female.  Chief Complaint: No chief complaint on file.   HPI Caitlin Burns presents to the office today for follow-up of ADHD, GAD and panic disorder.   Describes mood today as "ok". Pleasant. Denies tearfulness. Mood symptoms - denies depression, anxiety, and irritability. Mood is consistent. Stating "I'm doing good". Family doing well. Stable interest and motivation. Taking medications as prescribed.  Energy levels stable. Active, does not have a regular exercise routine.   Enjoys some usual interests and activities. Married. Lives with husband, 2 children and 3 dogs. Family local. Spending time with family. Appetite adequate. Weight stable - 145 pounds. Sleeps well most nights. Averages 7 hours. Focus and concentration stable with Vyvanse. Completing tasks. Managing aspects of household. 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 Denies SI or HI.  Denies AH or VH.  Previous medication trials: Denies   [place] Office Visit from 01/06/2021 in [place] at [facility] Visit from 01/05/2020 in [place] at [facility] Visit from 11/06/2019 in [place] at [facility]  Total GAD-7 Score 0 1 2      [place] Visit from 01/06/2021 in [place] at [facility] from 01/05/2020 in [place] at [place] from 11/06/2019 in [place] at [place]  PHQ-2 Total Score 0 0 0  PHQ-9 Total Score 0 0 0      Flowsheet Row ED from 12/26/2021 in [place] ED from 05/26/2021 in [facility] ED from 08/04/2020 in [facility] No Risk No Risk No Risk        Review of Systems:  Review of Systems  Musculoskeletal:  Negative for  gait problem.  Neurological:  Negative for tremors.  Psychiatric/Behavioral:         Please refer to HPI    Medications: I have reviewed the patient's current medications.  Current Outpatient Medications  Medication Sig Dispense Refill   Adalimumab (HUMIRA PEN) 40 MG/0.4ML PNKT Inject 40 mg subcutaneously every 14 (fourteen) days 2 each 5   albuterol (VENTOLIN HFA) 108 (90 Base) MCG/ACT inhaler Inhale 2 puffs into the lungs every 6 (six) hours as needed for wheezing or shortness of breath. 8.5 g 0   ALPRAZolam (XANAX) 0.5 MG tablet Take 1 tablet (0.5 mg total) by mouth 2 (two) times daily as needed. 180 tablet 0   amLODipine (NORVASC) 5 MG tablet Take 1 tablet (5 mg total) by mouth daily. 90 tablet 3   amoxicillin (AMOXIL) 875 MG tablet Take 1 tablet (875 mg total) by mouth 2 (two) times daily. 20 tablet 0   buPROPion (WELLBUTRIN XL) 150 MG 24 hr tablet TAKE 1 TABLET BY MOUTH DAILY AFTER BREAKFAST 90 tablet 3   butalbital-acetaminophen-caffeine (FIORICET) 50-325-40 MG tablet Take 1 tablet by mouth every 6 (six) hours as needed for headache. 20 tablet 0   cyclobenzaprine (FLEXERIL) 5 MG tablet Take 1-2 tablets (5-10 mg total) by mouth 2 (two) times daily as needed (migraine headache - sedation precautions). 30 tablet 1   drospirenone-ethinyl estradiol (JASMIEL) 3-0.02 MG tablet TAKE 1 TABLET BY MOUTH DAILY, CONTINUOUS ACTIVE PILLS 112 tablet 3   fluticasone (FLOVENT HFA) 110 MCG/ACT inhaler Inhale 1 puff into the lungs in the morning and  at bedtime. 12 g 11   lisdexamfetamine (VYVANSE) 60 MG capsule TAKE 1 CAPSULE BY MOUTH DAILY. 90 capsule 0   pantoprazole (PROTONIX) 20 MG tablet Take 1 tablet (20 mg total) by mouth 2 (two) times daily for 14 days. 28 tablet 0   pantoprazole (PROTONIX) 40 MG tablet Take 1 tablet by mouth daily 30 tablet 7   Rimegepant Sulfate (NURTEC) 75 MG TBDP Take 75 mg by mouth daily as needed. For migraines. Take as close to onset of migraine as possible. One daily  maximum. 8 tablet 6   No current facility-administered medications for this visit.    Medication Side Effects: None  Allergies:  Allergies  Allergen Reactions   Advil [Ibuprofen] Other (See Comments)    Stroke like ibuprofen   Aspirin Hives and Rash   Sulfonamide Derivatives Hives and Rash    Past Medical History:  Diagnosis Date   Anxiety 10/06/2010   Asthma 10/06/2010   ATTENTION DEFICIT HYPERACTIVITY DISORDER, 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   DEPRESSION 07/14/2007   Diabetes mellitus without complication ([place])    gestational   Essential hypertension, benign 03/24/2010   GERD (gastroesophageal reflux disease)    Helicobacter pylori ab+ 06/2021   treated for H pylori due to IgG +. 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   Please see review of systems for further details on the patient's review from today.   Objective:   Physical Exam:  There were no vitals taken for this visit.  Physical Exam Constitutional:      General: She is not in acute distress. Musculoskeletal:        General: No deformity.  Neurological:     Mental Status: She is alert and oriented to person, place, and time.     Coordination: Coordination normal.  Psychiatric:        Attention and Perception: Attention and perception normal. She does not perceive auditory or visual hallucinations.        Mood and Affect: Mood normal. Mood is not anxious or depressed. Affect is not labile, blunt, angry or inappropriate.        Speech: Speech normal.        Behavior: Behavior normal.        Thought Content: Thought content normal. Thought content is not paranoid or delusional. Thought content does not include homicidal or suicidal ideation. Thought content does not  include homicidal or suicidal plan.        Cognition and Memory: Cognition and memory normal.        Judgment: Judgment normal.     Comments: Insight intact     Lab Review:     Component Value Date/Time   NA 137 12/26/2021 1854   NA 141 06/16/2021 1602   K 3.4 (L) 12/26/2021 1854   CL 104 12/26/2021 1854   CO2 25 12/26/2021 1854   GLUCOSE 118 (H) 12/26/2021 1854   BUN 14 12/26/2021 1854   BUN 9 06/16/2021 1602   CREATININE 0.70 12/26/2021 1854   CALCIUM 9.3 12/26/2021 1854   PROT 8.2 (H) 12/26/2021 1854   PROT 7.6 06/16/2021 1602   ALBUMIN 4.1 12/26/2021 1854   ALBUMIN 4.6 06/16/2021 1602   AST 13 (L) 12/26/2021 1854   ALT 8 12/26/2021 1854  ALKPHOS 40 12/26/2021 1854   BILITOT 0.6 12/26/2021 1854   BILITOT 0.3 06/16/2021 1602   GFRNONAA >60 12/26/2021 1854   GFRAA 113 09/28/2019 1610       Component Value Date/Time   WBC 9.7 12/26/2021 1854   RBC 4.34 12/26/2021 1854   HGB 13.0 12/26/2021 1854   HGB 13.3 06/16/2021 1602   HCT 37.2 12/26/2021 1854   HCT 37.0 06/16/2021 1602   PLT 328 12/26/2021 1854   PLT 348 06/16/2021 1602   MCV 85.7 12/26/2021 1854   MCV 85 06/16/2021 1602   MCH 30.0 12/26/2021 1854   MCHC 34.9 12/26/2021 1854   RDW 11.9 12/26/2021 1854   RDW 11.7 06/16/2021 1602   LYMPHSABS 4.1 (H) 12/26/2021 1854   LYMPHSABS 3.5 (H) 06/16/2021 1602   MONOABS 0.6 12/26/2021 1854   EOSABS 0.2 12/26/2021 1854   EOSABS 0.1 06/16/2021 1602   BASOSABS 0.1 12/26/2021 1854   BASOSABS 0.0 06/16/2021 1602    No results found for: "POCLITH", "LITHIUM"   No results found for: "PHENYTOIN", "PHENOBARB", "VALPROATE", "CBMZ"   .res Assessment: Plan:    Plan:  PDMP reviewed  1. Xanax 0.'5mg'$  BID 2. Vyvanse '60mg'$  daily 3. Wellbutrin XL '150mg'$  daily  BP 129/97/85 plans to PCP  Read and reviewed note with patient for accuracy.   RTC 6 months - will call in 3 months for refills.  Patient advised to contact office with any questions, adverse effects, or  acute worsening in signs and symptoms.  Discussed potential benefits, risks, and side effects of stimulants with patient to include increased heart rate, palpitations, insomnia, increased anxiety, increased irritability, or decreased appetite.  Instructed patient to contact office if experiencing any significant tolerability issues.  Diagnoses and all orders for this visit:  Panic disorder  Generalized anxiety disorder -     ALPRAZolam (XANAX) 0.5 MG tablet; Take 1 tablet (0.5 mg total) by mouth 2 (two) times daily as needed. -     buPROPion (WELLBUTRIN XL) 150 MG 24 hr tablet; TAKE 1 TABLET BY MOUTH DAILY AFTER BREAKFAST  Attention-deficit hyperactivity disorder, combined type -     lisdexamfetamine (VYVANSE) 60 MG capsule; TAKE 1 CAPSULE BY MOUTH DAILY.     Please see After Visit Summary for patient specific instructions.  No future appointments.  No orders of the defined types were placed in this encounter.   -------------------------------

## 2022-01-11 ENCOUNTER — Other Ambulatory Visit: Payer: Self-pay

## 2022-01-15 ENCOUNTER — Other Ambulatory Visit (HOSPITAL_COMMUNITY): Payer: Self-pay

## 2022-01-15 ENCOUNTER — Other Ambulatory Visit: Payer: Self-pay

## 2022-01-16 ENCOUNTER — Other Ambulatory Visit (HOSPITAL_COMMUNITY): Payer: Self-pay

## 2022-01-21 ENCOUNTER — Other Ambulatory Visit: Payer: Self-pay

## 2022-01-22 ENCOUNTER — Encounter: Payer: Self-pay | Admitting: Family Medicine

## 2022-01-22 ENCOUNTER — Other Ambulatory Visit: Payer: Self-pay

## 2022-01-23 ENCOUNTER — Other Ambulatory Visit: Payer: Self-pay

## 2022-01-23 ENCOUNTER — Other Ambulatory Visit (HOSPITAL_COMMUNITY): Payer: Self-pay

## 2022-01-25 NOTE — Telephone Encounter (Signed)
Spoke with pt offering OV on 01/29/22 at 2:00.  Pt declined and needs a later appt.  Ok to offer 4:30 OV on 01/29/22?

## 2022-01-26 ENCOUNTER — Other Ambulatory Visit: Payer: Self-pay

## 2022-01-26 ENCOUNTER — Other Ambulatory Visit (HOSPITAL_COMMUNITY): Payer: Self-pay

## 2022-01-26 ENCOUNTER — Ambulatory Visit: Payer: 59 | Admitting: Family Medicine

## 2022-01-26 MED ORDER — PANTOPRAZOLE SODIUM 40 MG PO TBEC
40.0000 mg | DELAYED_RELEASE_TABLET | Freq: Every day | ORAL | 0 refills | Status: DC
  Filled 2022-01-26: qty 30, 30d supply, fill #0

## 2022-01-26 NOTE — Telephone Encounter (Signed)
Noted.  Will have pt added to schedule.

## 2022-01-26 NOTE — Telephone Encounter (Signed)
Ok to offer 4:30pm have her come in at 4:15pm

## 2022-01-29 ENCOUNTER — Ambulatory Visit: Payer: 59 | Admitting: Family Medicine

## 2022-01-29 ENCOUNTER — Other Ambulatory Visit: Payer: Self-pay

## 2022-01-29 ENCOUNTER — Encounter: Payer: Self-pay | Admitting: Family Medicine

## 2022-01-29 VITALS — BP 126/88 | HR 91 | Temp 97.8°F | Ht 66.0 in | Wt 147.4 lb

## 2022-01-29 DIAGNOSIS — I10 Essential (primary) hypertension: Secondary | ICD-10-CM

## 2022-01-29 DIAGNOSIS — R002 Palpitations: Secondary | ICD-10-CM | POA: Diagnosis not present

## 2022-01-29 DIAGNOSIS — N921 Excessive and frequent menstruation with irregular cycle: Secondary | ICD-10-CM | POA: Diagnosis not present

## 2022-01-29 DIAGNOSIS — I7300 Raynaud's syndrome without gangrene: Secondary | ICD-10-CM | POA: Diagnosis not present

## 2022-01-29 LAB — POCT URINE PREGNANCY: Preg Test, Ur: NEGATIVE

## 2022-01-29 MED ORDER — LISINOPRIL 5 MG PO TABS
5.0000 mg | ORAL_TABLET | Freq: Every day | ORAL | 6 refills | Status: DC
  Filled 2022-01-29: qty 30, 30d supply, fill #0

## 2022-01-29 NOTE — Assessment & Plan Note (Signed)
Sees rheumatology on amlodipine.

## 2022-01-29 NOTE — Progress Notes (Signed)
Patient ID: Caitlin Burns, female    DOB: 05-Aug-1980, 41 y.o.   MRN: [id number]  This visit was conducted in person.  BP 126/88   Pulse 91   Temp 97.8 F (36.6 C) (Temporal)   Ht '5\' 6"'$  (1.676 m)   Wt 147 lb 6 oz (66.8 kg)   SpO2 100%   BMI 23.79 kg/m   BP Readings from Last 3 Encounters:  01/29/22 126/88  12/26/21 (!) 153/101  09/13/21 120/80   CC: f/u HTN  Subjective:   HPI: Caitlin Burns is a 41 y.o. female presenting on 01/29/2022 for Hypertension (Here for f/u.  C/o higher than usual BP readings off and on and fatigue.  Last night about 6:00 PM-180/113.  Then 158/98 about 1 hr later. )   HTN - Compliant with current antihypertensive regimen of amlodipine '5mg'$  daily. Does check blood pressures at home: today 126-148/77-88. No low blood pressure readings or symptoms of dizziness/syncope. Denies vision changes, CP/tightness, SOB, leg swelling.   She's been checking BP with wrist cuff at home.   Notes for months now occasional rises in blood pressure to 160-170/110s most noticeable in the evenings ie when she gets home from work, associated with flushed face and hot feeling. Occasional headache, occasional palpitation. Can feel very tired when BP goes up. No sweating/diaphoresis. This can happen both with increased stress, but also even when at rest.  Last night 180/113, she took a shower and 1 hour later bp 158/98.   She is on vyvanse, yaz (4 month continuous), and wellbutrin XL '150mg'$  daily - all of which can have an effect on blood pressures. .      Relevant past medical, surgical, family and social history reviewed and updated as indicated. Interim medical history since our last visit reviewed. Allergies and medications reviewed and updated. Outpatient Medications Prior to Visit  Medication Sig Dispense Refill   Adalimumab (HUMIRA PEN) 40 MG/0.4ML PNKT Inject 40 mg subcutaneously every 14 (fourteen) days 2 each 5   albuterol (VENTOLIN HFA) 108 (90 Base) MCG/ACT  inhaler Inhale 2 puffs into the lungs every 6 (six) hours as needed for wheezing or shortness of breath. 8.5 g 0   ALPRAZolam (XANAX) 0.5 MG tablet Take 1 tablet (0.5 mg total) by mouth 2 (two) times daily as needed. 180 tablet 0   amLODipine (NORVASC) 5 MG tablet Take 1 tablet (5 mg total) by mouth daily. 90 tablet 3   buPROPion (WELLBUTRIN XL) 150 MG 24 hr tablet TAKE 1 TABLET BY MOUTH DAILY AFTER BREAKFAST 90 tablet 3   cyclobenzaprine (FLEXERIL) 5 MG tablet Take 1-2 tablets (5-10 mg total) by mouth 2 (two) times daily as needed (migraine headache - sedation precautions). 30 tablet 1   drospirenone-ethinyl estradiol (JASMIEL) 3-0.02 MG tablet TAKE 1 TABLET BY MOUTH DAILY, CONTINUOUS ACTIVE PILLS 112 tablet 3   fluticasone (FLOVENT HFA) 110 MCG/ACT inhaler Inhale 1 puff into the lungs in the morning and at bedtime. 12 g 11   lisdexamfetamine (VYVANSE) 60 MG capsule TAKE 1 CAPSULE BY MOUTH DAILY. 90 capsule 0   pantoprazole (PROTONIX) 20 MG tablet Take 1 tablet (20 mg total) by mouth 2 (two) times daily for 14 days. 28 tablet 0   pantoprazole (PROTONIX) 40 MG tablet Take 1 tablet by mouth daily 30 tablet 0   Rimegepant Sulfate (NURTEC) 75 MG TBDP Take 75 mg by mouth daily as needed. For migraines. Take as close to onset of migraine as possible. One daily maximum.  8 tablet 6   amoxicillin (AMOXIL) 875 MG tablet Take 1 tablet (875 mg total) by mouth 2 (two) times daily. 20 tablet 0   butalbital-acetaminophen-caffeine (FIORICET) 50-325-40 MG tablet Take 1 tablet by mouth every 6 (six) hours as needed for headache. 20 tablet 0   No facility-administered medications prior to visit.     Per HPI unless specifically indicated in ROS section below Review of Systems  Objective:  BP 126/88   Pulse 91   Temp 97.8 F (36.6 C) (Temporal)   Ht '5\' 6"'$  (1.676 m)   Wt 147 lb 6 oz (66.8 kg)   SpO2 100%   BMI 23.79 kg/m   Wt Readings from Last 3 Encounters:  01/29/22 147 lb 6 oz (66.8 kg)  12/26/21 140  lb (63.5 kg)  09/13/21 148 lb 8 oz (67.4 kg)      Physical Exam Vitals and nursing note reviewed.  Constitutional:      Appearance: Normal appearance. She is not ill-appearing.  HENT:     Head: Normocephalic and atraumatic.     Mouth/Throat:     Mouth: Mucous membranes are moist.     Pharynx: Oropharynx is clear. No oropharyngeal exudate or posterior oropharyngeal erythema.  Eyes:     Extraocular Movements: Extraocular movements intact.     Conjunctiva/sclera: Conjunctivae normal.     Pupils: Pupils are equal, round, and reactive to light.  Neck:     Thyroid: No thyroid mass or thyromegaly.  Cardiovascular:     Rate and Rhythm: Normal rate and regular rhythm.     Pulses: Normal pulses.     Heart sounds: Normal heart sounds. No murmur heard. Pulmonary:     Effort: Pulmonary effort is normal. No respiratory distress.     Breath sounds: Normal breath sounds. No wheezing, rhonchi or rales.  Musculoskeletal:     Cervical back: Normal range of motion and neck supple.     Right lower leg: No edema.     Left lower leg: No edema.  Skin:    General: Skin is warm and dry.  Neurological:     Mental Status: She is alert.  Psychiatric:        Mood and Affect: Mood normal.        Behavior: Behavior normal.       Results for orders placed or performed during the hospital encounter of 12/26/21  Comprehensive metabolic panel  Result Value Ref Range   Sodium 137 135 - 145 mmol/L   Potassium 3.4 (L) 3.5 - 5.1 mmol/L   Chloride 104 98 - 111 mmol/L   CO2 25 22 - 32 mmol/L   Glucose, Bld 118 (H) 70 - 99 mg/dL   BUN 14 6 - 20 mg/dL   Creatinine, Ser 0.70 0.44 - 1.00 mg/dL   Calcium 9.3 8.9 - 10.3 mg/dL   Total Protein 8.2 (H) 6.5 - 8.1 g/dL   Albumin 4.1 3.5 - 5.0 g/dL   AST 13 (L) 15 - 41 U/L   ALT 8 0 - 44 U/L   Alkaline Phosphatase 40 38 - 126 U/L   Total Bilirubin 0.6 0.3 - 1.2 mg/dL   GFR, Estimated >60 >60 mL/min   Anion gap 8 5 - 15  CBC with Differential  Result Value Ref  Range   WBC 9.7 4.0 - 10.5 K/uL   RBC 4.34 3.87 - 5.11 MIL/uL   Hemoglobin 13.0 12.0 - 15.0 g/dL   HCT 37.2 36.0 - 46.0 %   MCV  85.7 80.0 - 100.0 fL   MCH 30.0 26.0 - 34.0 pg   MCHC 34.9 30.0 - 36.0 g/dL   RDW 11.9 11.5 - 15.5 %   Platelets 328 150 - 400 K/uL   nRBC 0.0 0.0 - 0.2 %   Neutrophils Relative % 49 %   Neutro Abs 4.8 1.7 - 7.7 K/uL   Lymphocytes Relative 42 %   Lymphs Abs 4.1 (H) 0.7 - 4.0 K/uL   Monocytes Relative 6 %   Monocytes Absolute 0.6 0.1 - 1.0 K/uL   Eosinophils Relative 2 %   Eosinophils Absolute 0.2 0.0 - 0.5 K/uL   Basophils Relative 1 %   Basophils Absolute 0.1 0.0 - 0.1 K/uL   Immature Granulocytes 0 %   Abs Immature Granulocytes 0.02 0.00 - 0.07 K/uL  Lipase, blood  Result Value Ref Range   Lipase 39 11 - 51 U/L  Urinalysis, Routine w reflex microscopic Urine, Clean Catch  Result Value Ref Range   Color, Urine YELLOW (A) YELLOW   APPearance HAZY (A) CLEAR   Specific Gravity, Urine 1.024 1.005 - 1.030   pH 5.0 5.0 - 8.0   Glucose, UA NEGATIVE NEGATIVE mg/dL   Hgb urine dipstick NEGATIVE NEGATIVE   Bilirubin Urine NEGATIVE NEGATIVE   Ketones, ur NEGATIVE NEGATIVE mg/dL   Protein, ur NEGATIVE NEGATIVE mg/dL   Nitrite NEGATIVE NEGATIVE   Leukocytes,Ua NEGATIVE NEGATIVE  POC urine preg, ED  Result Value Ref Range   Preg Test, Ur NEGATIVE NEGATIVE   Lab Results  Component Value Date   TSH 1.32 09/09/2020    Assessment & Plan:   Problem List Items Addressed This Visit     Essential hypertension - Primary    Chronic, stable in office however notes very high home readings predominantly in evenings. She's been checking with a wrist cuff- advised buy arm cuff. Given episodes of high BP (to 825 systolic) associated with flushed face, heat intolerance, headache, prudent to r/o pheochromocytoma - sent home for 24 hour collection for urine fractionated catecholamines and metanephrine. Will also check for other secondary causes with TSH,  aldosterone/renin levels.  With high readings in evenings, will add lisinopril '5mg'$  nightly to her current amlodipine '5mg'$  daily regimen. Reviewed side effect of nagging cough, angioedema allergy, and teratogenicity so she needs to avoid pregnancy. Check Upreg with ACEI start. She agrees with above.  Update with effect over MyChart.       Relevant Medications   lisinopril (ZESTRIL) 5 MG tablet   Other Relevant Orders   TSH   Aldosterone + renin activity w/ ratio   Metanephrines, urine, 24 hour   Catecholamines, fractionated, urine, 24 hour   POCT urine pregnancy   Palpitations    Has recorded pulse up to 120s while at rest at work - see labs today.  Check labs today.       Raynaud's syndrome    Sees rheumatology on amlodipine.       Relevant Medications   lisinopril (ZESTRIL) 5 MG tablet   Other Visit Diagnoses     Irregular intermenstrual bleeding       Relevant Orders   POCT urine pregnancy        Meds ordered this encounter  Medications   lisinopril (ZESTRIL) 5 MG tablet    Sig: Take 1 tablet (5 mg total) by mouth daily.    Dispense:  30 tablet    Refill:  6   Orders Placed This Encounter  Procedures   TSH  Aldosterone + renin activity w/ ratio   Metanephrines, urine, 24 hour    Standing Status:   Future    Standing Expiration Date:   01/30/2023   Catecholamines, fractionated, urine, 24 hour    Standing Status:   Future    Standing Expiration Date:   01/30/2023   POCT urine pregnancy     Patient Instructions  Work on fruits/vegetables - high potassium diet.  Continue good water intake.  Continue amlodipine '5mg'$  daily. Add lisinopril '5mg'$  daily at night time (watch for dry cough, tongue/lip swelling, no pregnancy on it).  Use arm cuff to check BP.  Pass by lab for urine test and for labwork.   Follow up plan: Return if symptoms worsen or fail to improve.  Caitlin Bush, MD

## 2022-01-29 NOTE — Assessment & Plan Note (Addendum)
Has recorded pulse up to 120s while at rest at work - see labs today.  Check labs today.

## 2022-01-29 NOTE — Patient Instructions (Addendum)
Work on fruits/vegetables - high potassium diet.  Continue good water intake.  Continue amlodipine '5mg'$  daily. Add lisinopril '5mg'$  daily at night time (watch for dry cough, tongue/lip swelling, no pregnancy on it).  Use arm cuff to check BP.  Pass by lab for urine test and for labwork.

## 2022-01-29 NOTE — Assessment & Plan Note (Addendum)
Chronic, stable in office however notes very high home readings predominantly in evenings. She's been checking with a wrist cuff- advised buy arm cuff. Given episodes of high BP (to 403 systolic) associated with flushed face, heat intolerance, headache, prudent to r/o pheochromocytoma - sent home for 24 hour collection for urine fractionated catecholamines and metanephrine. Will also check for other secondary causes with TSH, aldosterone/renin levels.  With high readings in evenings, will add lisinopril '5mg'$  nightly to her current amlodipine '5mg'$  daily regimen. Reviewed side effect of nagging cough, angioedema allergy, and teratogenicity so she needs to avoid pregnancy. Check Upreg with ACEI start. She agrees with above.  Update with effect over MyChart.

## 2022-01-30 LAB — TSH: TSH: 1.78 u[IU]/mL (ref 0.35–5.50)

## 2022-02-03 LAB — ALDOSTERONE + RENIN ACTIVITY W/ RATIO
ALDO / PRA Ratio: 4.2 Ratio (ref 0.9–28.9)
Aldosterone: 23 ng/dL
Renin Activity: 5.44 ng/mL/h (ref 0.25–5.82)

## 2022-02-14 ENCOUNTER — Encounter: Payer: Self-pay | Admitting: Family Medicine

## 2022-02-15 ENCOUNTER — Other Ambulatory Visit (HOSPITAL_COMMUNITY): Payer: Self-pay

## 2022-02-19 ENCOUNTER — Other Ambulatory Visit: Payer: Self-pay

## 2022-02-19 DIAGNOSIS — Z01419 Encounter for gynecological examination (general) (routine) without abnormal findings: Secondary | ICD-10-CM | POA: Diagnosis not present

## 2022-02-19 DIAGNOSIS — Z113 Encounter for screening for infections with a predominantly sexual mode of transmission: Secondary | ICD-10-CM | POA: Diagnosis not present

## 2022-02-19 DIAGNOSIS — Z124 Encounter for screening for malignant neoplasm of cervix: Secondary | ICD-10-CM | POA: Diagnosis not present

## 2022-02-19 DIAGNOSIS — Z01411 Encounter for gynecological examination (general) (routine) with abnormal findings: Secondary | ICD-10-CM | POA: Diagnosis not present

## 2022-02-19 LAB — HM MAMMOGRAPHY

## 2022-02-19 MED ORDER — DROSPIRENONE-ETHINYL ESTRADIOL 3-0.02 MG PO TABS
ORAL_TABLET | ORAL | 3 refills | Status: DC
  Filled 2022-02-19: qty 112, 84d supply, fill #0
  Filled 2022-06-05: qty 112, 84d supply, fill #1
  Filled 2022-09-03: qty 112, 84d supply, fill #2

## 2022-02-22 LAB — HM PAP SMEAR: HPV, high-risk: NEGATIVE

## 2022-02-23 ENCOUNTER — Other Ambulatory Visit: Payer: Self-pay

## 2022-02-26 ENCOUNTER — Other Ambulatory Visit (HOSPITAL_COMMUNITY): Payer: Self-pay

## 2022-03-07 ENCOUNTER — Other Ambulatory Visit: Payer: Self-pay

## 2022-03-08 ENCOUNTER — Other Ambulatory Visit: Payer: Self-pay

## 2022-03-12 ENCOUNTER — Encounter: Payer: Self-pay | Admitting: Family Medicine

## 2022-03-12 ENCOUNTER — Other Ambulatory Visit: Payer: Self-pay

## 2022-03-13 ENCOUNTER — Other Ambulatory Visit: Payer: Self-pay

## 2022-03-13 MED ORDER — PANTOPRAZOLE SODIUM 40 MG PO TBEC
40.0000 mg | DELAYED_RELEASE_TABLET | Freq: Every day | ORAL | 1 refills | Status: DC
  Filled 2022-03-13: qty 90, 90d supply, fill #0
  Filled 2022-06-05: qty 90, 90d supply, fill #1

## 2022-03-13 NOTE — Telephone Encounter (Signed)
ERx 

## 2022-03-15 ENCOUNTER — Other Ambulatory Visit (HOSPITAL_COMMUNITY): Payer: Self-pay

## 2022-03-19 ENCOUNTER — Other Ambulatory Visit: Payer: Self-pay | Admitting: *Deleted

## 2022-03-19 DIAGNOSIS — Z7960 Long term (current) use of unspecified immunomodulators and immunosuppressants: Secondary | ICD-10-CM | POA: Diagnosis not present

## 2022-03-19 DIAGNOSIS — I10 Essential (primary) hypertension: Secondary | ICD-10-CM

## 2022-03-19 DIAGNOSIS — I7300 Raynaud's syndrome without gangrene: Secondary | ICD-10-CM | POA: Diagnosis not present

## 2022-03-19 DIAGNOSIS — M47819 Spondylosis without myelopathy or radiculopathy, site unspecified: Secondary | ICD-10-CM | POA: Diagnosis not present

## 2022-03-19 DIAGNOSIS — M458 Ankylosing spondylitis sacral and sacrococcygeal region: Secondary | ICD-10-CM | POA: Diagnosis not present

## 2022-03-22 ENCOUNTER — Other Ambulatory Visit: Payer: Self-pay

## 2022-03-25 LAB — METANEPHRINES, URINE, 24 HOUR
METANEPHRINE: 100 mcg/24 h (ref 58–203)
METANEPHRINES, TOTAL: 379 mcg/24 h (ref 182–739)
NORMETANEPHRINE: 279 mcg/24 h (ref 88–649)
Total Volume: 2100 mL

## 2022-03-25 LAB — CATECHOLAMINES, FRACTIONATED, URINE, 24 HOUR
Calc Total (E+NE): 38 mcg/24 h (ref 26–121)
Creatinine, Urine mg/day-CATEUR: 1.3 g/(24.h) (ref 0.50–2.15)
Dopamine 24 Hr Urine: 505 mcg/24 h — ABNORMAL HIGH (ref 52–480)
Norepinephrine, 24H, Ur: 38 mcg/24 h (ref 15–100)
Total Volume: 2100 mL

## 2022-03-26 ENCOUNTER — Other Ambulatory Visit (HOSPITAL_COMMUNITY): Payer: Self-pay

## 2022-03-27 ENCOUNTER — Other Ambulatory Visit: Payer: Self-pay

## 2022-04-04 IMAGING — CR DG RIBS 2V*R*
1 series · 4 of 4 positions shown · non-contrast
Comparison: 06/22/2009

CLINICAL DATA: Blunt trauma to the right chest several days ago
with persistent pain, initial encounter

EXAM:
RIGHT RIBS - 2 VIEW

[Series 1: dg ribs unilateral right · 0.14mm/px · 4 of 4 slices shown]
[im 1/4]
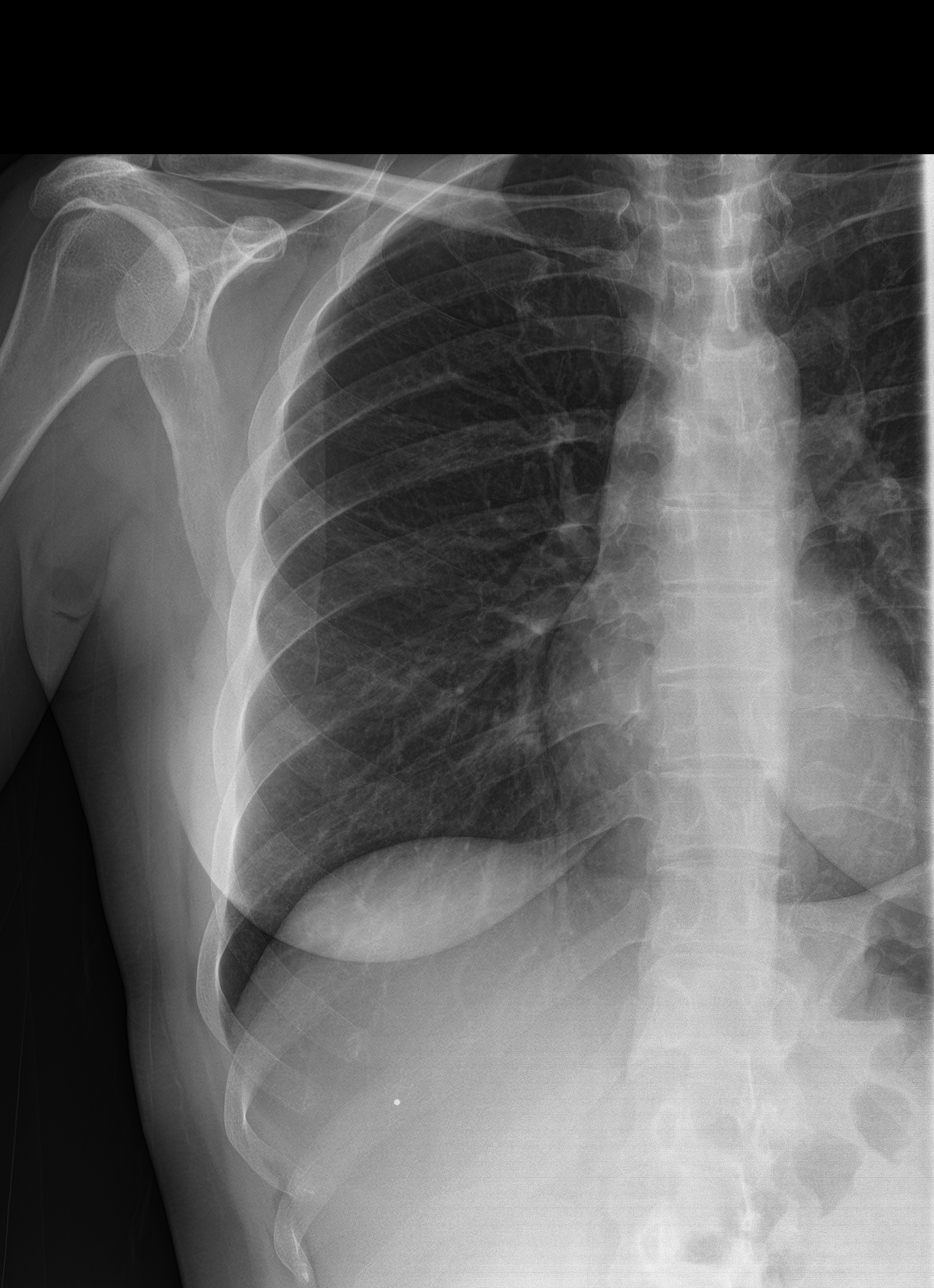
[im 2/4]
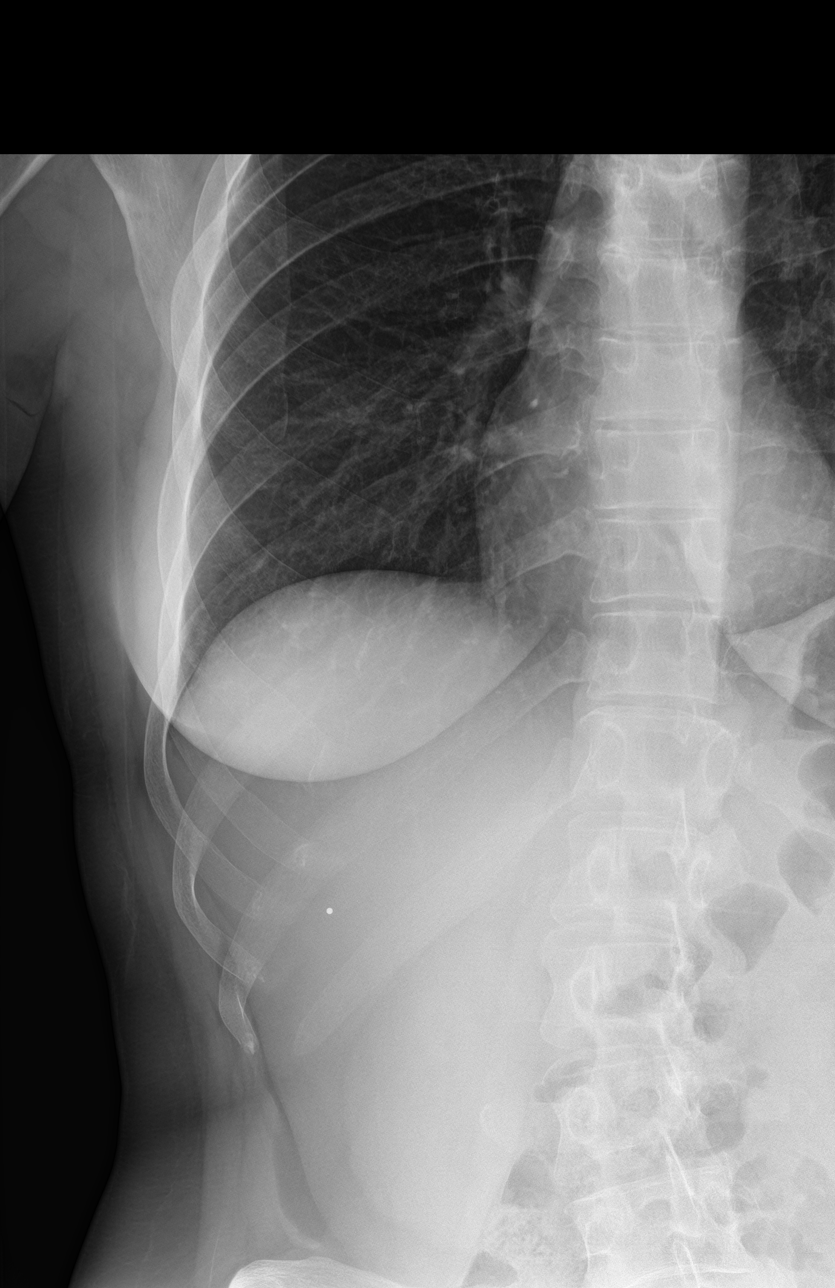
[im 3/4]
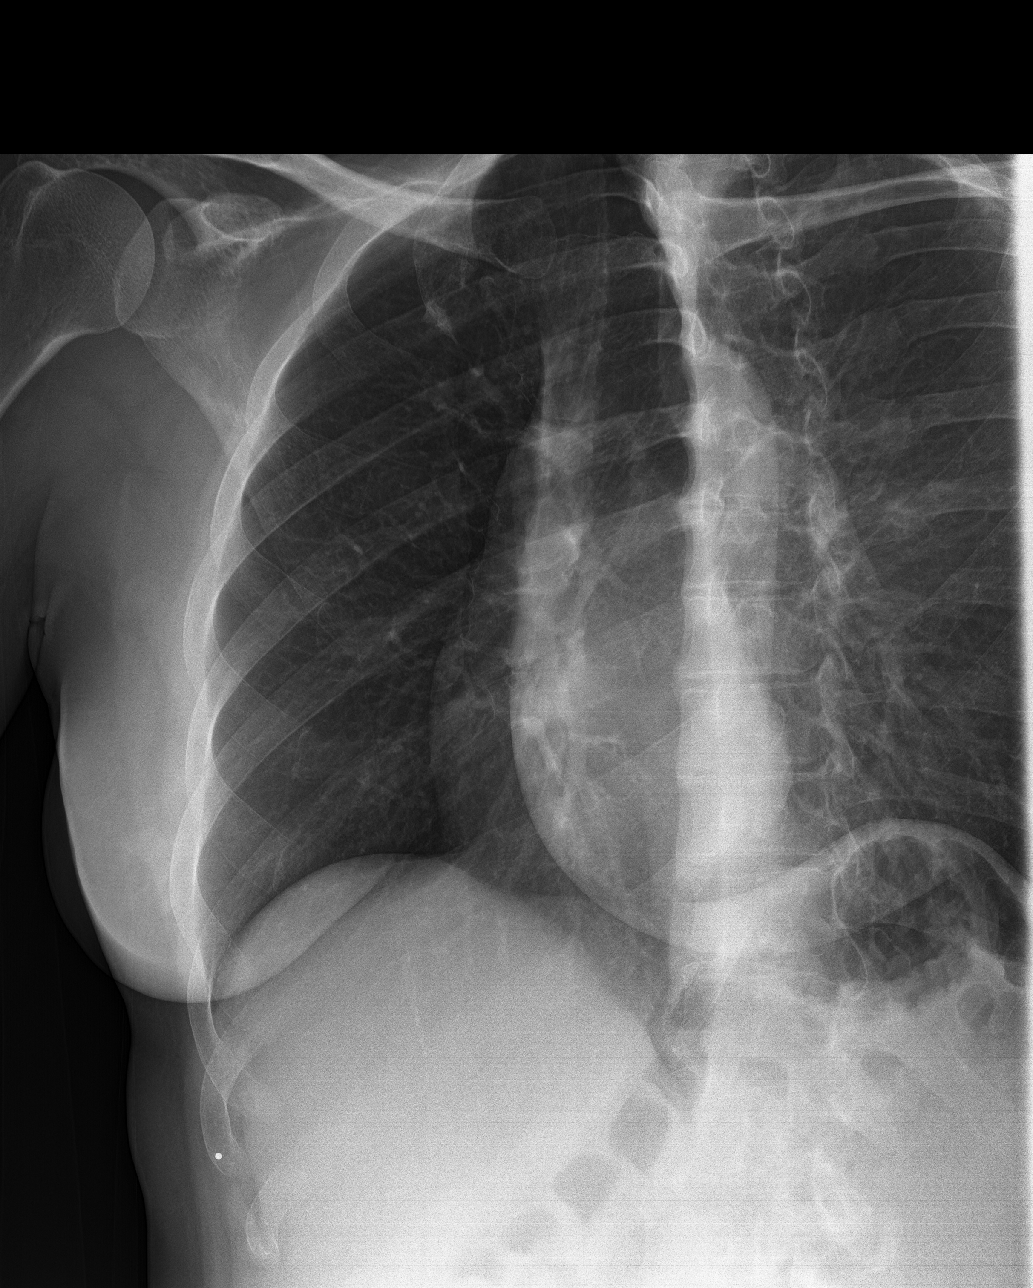
[im 4/4]
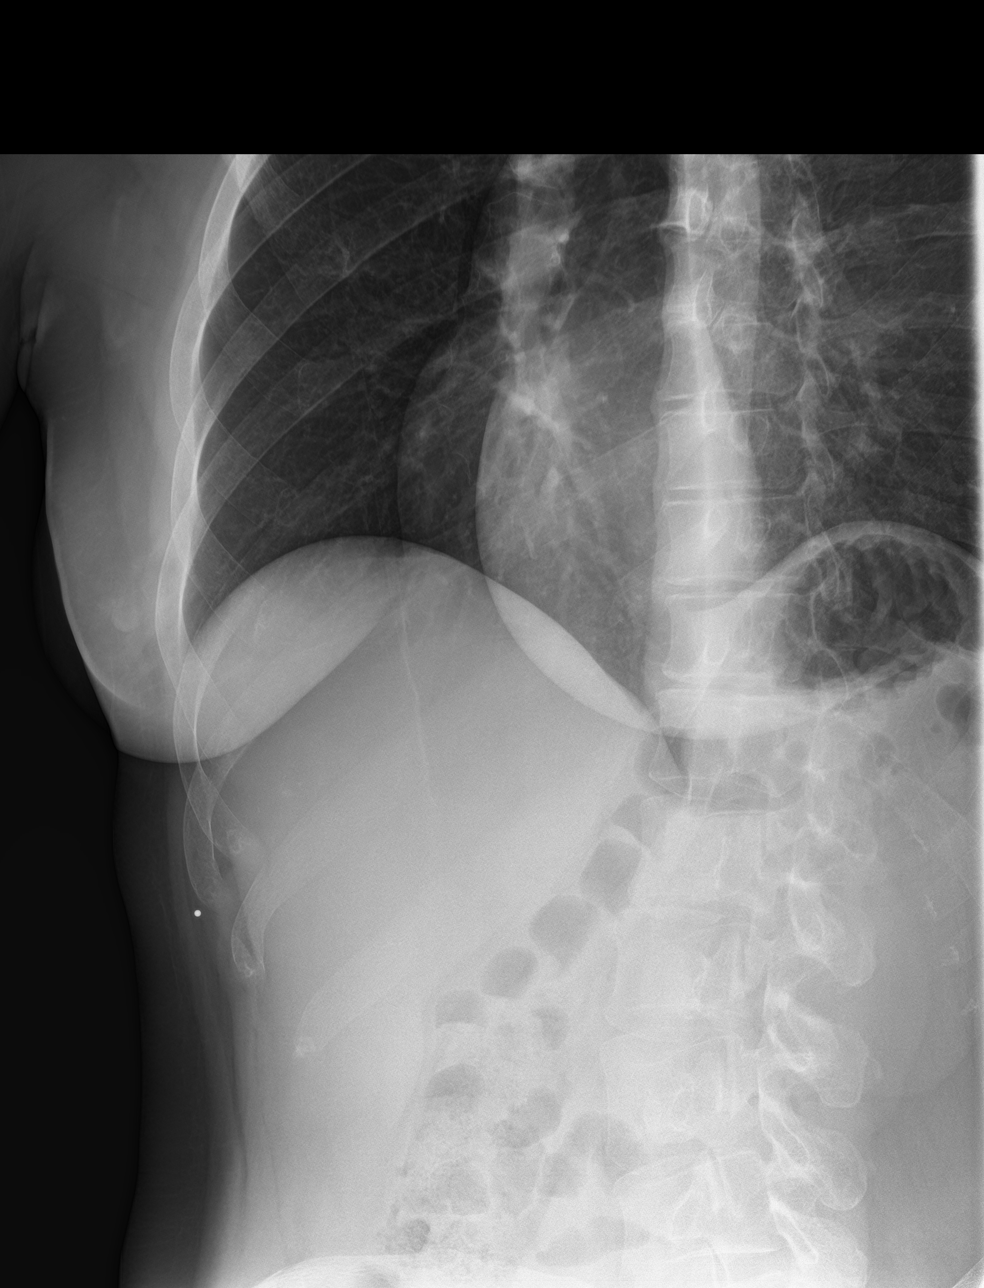

[4 of 4 positions shown; findings below may reference images not displayed]

FINDINGS: No fracture or other bone lesions are seen involving the ribs.
IMPRESSION: No acute abnormality noted.

## 2022-04-15 ENCOUNTER — Other Ambulatory Visit: Payer: Self-pay | Admitting: Family Medicine

## 2022-04-15 DIAGNOSIS — I10 Essential (primary) hypertension: Secondary | ICD-10-CM

## 2022-04-16 ENCOUNTER — Other Ambulatory Visit: Payer: Self-pay

## 2022-04-16 MED ORDER — AMLODIPINE BESYLATE 5 MG PO TABS
5.0000 mg | ORAL_TABLET | Freq: Every day | ORAL | 0 refills | Status: DC
  Filled 2022-04-16: qty 90, 90d supply, fill #0

## 2022-04-16 NOTE — Telephone Encounter (Signed)
E-scribed refill.  Plz schedule CPE and lab visits for additional refills.  

## 2022-04-17 ENCOUNTER — Other Ambulatory Visit (HOSPITAL_COMMUNITY): Payer: Self-pay

## 2022-04-17 NOTE — Telephone Encounter (Signed)
Noted  

## 2022-04-17 NOTE — Telephone Encounter (Signed)
Patient scheduled.

## 2022-04-20 ENCOUNTER — Other Ambulatory Visit: Payer: Self-pay

## 2022-04-20 ENCOUNTER — Other Ambulatory Visit (HOSPITAL_COMMUNITY): Payer: Self-pay

## 2022-04-22 ENCOUNTER — Other Ambulatory Visit: Payer: Self-pay

## 2022-04-22 ENCOUNTER — Telehealth: Payer: Self-pay | Admitting: Adult Health

## 2022-04-22 ENCOUNTER — Other Ambulatory Visit: Payer: Self-pay | Admitting: Adult Health

## 2022-04-22 DIAGNOSIS — F902 Attention-deficit hyperactivity disorder, combined type: Secondary | ICD-10-CM

## 2022-04-22 DIAGNOSIS — F411 Generalized anxiety disorder: Secondary | ICD-10-CM

## 2022-04-23 ENCOUNTER — Other Ambulatory Visit: Payer: Self-pay

## 2022-04-24 ENCOUNTER — Other Ambulatory Visit: Payer: Self-pay

## 2022-04-24 MED FILL — Alprazolam Tab 0.5 MG: ORAL | 30 days supply | Qty: 60 | Fill #0 | Status: AC

## 2022-04-24 MED FILL — Lisdexamfetamine Dimesylate Cap 60 MG: ORAL | 90 days supply | Qty: 90 | Fill #0 | Status: AC

## 2022-04-24 NOTE — Telephone Encounter (Signed)
Please schedule appt

## 2022-04-24 NOTE — Telephone Encounter (Signed)
Filled 9/25 and 9/11 please send for gina

## 2022-05-11 ENCOUNTER — Other Ambulatory Visit (HOSPITAL_COMMUNITY): Payer: Self-pay

## 2022-05-15 ENCOUNTER — Other Ambulatory Visit (HOSPITAL_COMMUNITY): Payer: Self-pay

## 2022-05-17 ENCOUNTER — Other Ambulatory Visit: Payer: Self-pay

## 2022-05-17 ENCOUNTER — Other Ambulatory Visit (HOSPITAL_COMMUNITY): Payer: Self-pay

## 2022-05-17 NOTE — Telephone Encounter (Signed)
Pt has appt 1/30

## 2022-05-18 ENCOUNTER — Other Ambulatory Visit (HOSPITAL_COMMUNITY): Payer: Self-pay

## 2022-05-18 ENCOUNTER — Other Ambulatory Visit: Payer: Self-pay

## 2022-05-21 ENCOUNTER — Telehealth: Payer: Self-pay | Admitting: Physician Assistant

## 2022-05-21 ENCOUNTER — Other Ambulatory Visit (HOSPITAL_COMMUNITY): Payer: Self-pay

## 2022-05-21 NOTE — Telephone Encounter (Signed)
Pt called and said that her next appt is 06/05/22. She needs a refill on her xanax. Pharmacy is [facility]

## 2022-05-21 NOTE — Telephone Encounter (Signed)
Last filled 12/19, due 1/16

## 2022-05-22 ENCOUNTER — Other Ambulatory Visit: Payer: Self-pay

## 2022-05-22 ENCOUNTER — Other Ambulatory Visit (HOSPITAL_COMMUNITY): Payer: Self-pay

## 2022-05-22 DIAGNOSIS — F411 Generalized anxiety disorder: Secondary | ICD-10-CM

## 2022-05-22 MED ORDER — ALPRAZOLAM 0.5 MG PO TABS
0.5000 mg | ORAL_TABLET | Freq: Two times a day (BID) | ORAL | 0 refills | Status: DC | PRN
  Filled 2022-05-22: qty 60, 30d supply, fill #0

## 2022-05-22 NOTE — Telephone Encounter (Signed)
Pended.

## 2022-05-24 ENCOUNTER — Other Ambulatory Visit (HOSPITAL_COMMUNITY): Payer: Self-pay

## 2022-05-28 ENCOUNTER — Other Ambulatory Visit: Payer: Self-pay | Admitting: Nurse Practitioner

## 2022-05-28 DIAGNOSIS — U071 COVID-19: Secondary | ICD-10-CM

## 2022-06-02 ENCOUNTER — Other Ambulatory Visit: Payer: Self-pay

## 2022-06-02 ENCOUNTER — Other Ambulatory Visit: Payer: Self-pay | Admitting: Nurse Practitioner

## 2022-06-02 DIAGNOSIS — U071 COVID-19: Secondary | ICD-10-CM

## 2022-06-04 ENCOUNTER — Other Ambulatory Visit: Payer: Self-pay

## 2022-06-05 ENCOUNTER — Ambulatory Visit (INDEPENDENT_AMBULATORY_CARE_PROVIDER_SITE_OTHER): Payer: Commercial Managed Care - PPO | Admitting: Adult Health

## 2022-06-05 ENCOUNTER — Encounter: Payer: Self-pay | Admitting: Adult Health

## 2022-06-05 ENCOUNTER — Other Ambulatory Visit: Payer: Self-pay

## 2022-06-05 ENCOUNTER — Other Ambulatory Visit: Payer: Self-pay | Admitting: Family Medicine

## 2022-06-05 DIAGNOSIS — U071 COVID-19: Secondary | ICD-10-CM

## 2022-06-05 DIAGNOSIS — F902 Attention-deficit hyperactivity disorder, combined type: Secondary | ICD-10-CM | POA: Diagnosis not present

## 2022-06-05 DIAGNOSIS — F411 Generalized anxiety disorder: Secondary | ICD-10-CM | POA: Diagnosis not present

## 2022-06-05 DIAGNOSIS — F410 Panic disorder [episodic paroxysmal anxiety] without agoraphobia: Secondary | ICD-10-CM | POA: Diagnosis not present

## 2022-06-05 DIAGNOSIS — J4520 Mild intermittent asthma, uncomplicated: Secondary | ICD-10-CM

## 2022-06-05 MED ORDER — ALPRAZOLAM 0.5 MG PO TABS
0.5000 mg | ORAL_TABLET | Freq: Three times a day (TID) | ORAL | 2 refills | Status: DC | PRN
  Filled 2022-06-05 – 2022-06-25 (×2): qty 90, 30d supply, fill #0
  Filled 2022-07-30: qty 90, 30d supply, fill #1
  Filled 2022-09-03: qty 10, 3d supply, fill #2
  Filled 2022-09-03: qty 80, 27d supply, fill #2

## 2022-06-05 NOTE — Progress Notes (Signed)
Caitlin Burns [id number] 04/23/1981 42 y.o.  Subjective:   Patient ID:  Caitlin Burns is a 42 y.o. (DOB 21-Nov-1980) female.  Chief Complaint: No chief complaint on file.   HPI Caitlin Burns presents to the office today for follow-up of ADHD, GAD and panic disorder.   Describes mood today as "ok". Pleasant. Denies tearfulness. Mood symptoms - denies depression, anxiety, and irritability. Mood is consistent. Stating "I'm doing good". Feels like medications work well. Family doing well. Stable interest and motivation. Taking medications as prescribed.  Energy levels stable. Active, does not have a regular exercise routine.   Enjoys some usual interests and activities. Married. Lives with husband, 2 children and 3 dogs. Family local. Spending time with family. Appetite adequate. Weight stable - 145 pounds. Sleeps well most nights. Averages 7 hours. Focus and concentration stable with Vyvanse. Completing tasks. Managing aspects of household. 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 Denies SI or HI.  Denies AH or VH. Denies self harm. Denies substance use.  Previous medication trials: Denies   [place] Office Visit from 01/06/2021 in [place] at [facility] Visit from 01/05/2020 in [place] at [facility] Visit from 11/06/2019 in [place] at [facility]  Total GAD-7 Score 0 1 2      [place] Office Visit from 01/06/2021 in [place] at [facility] Visit from 01/05/2020 in [place] at [place] Visit from 11/06/2019 in [place] at [place]  PHQ-2 Total Score 0 0 0  PHQ-9 Total Score 0 0 0      Flowsheet Row ED from 12/26/2021 in [facility] Emergency Department at [facility] ED from 05/26/2021 in [facility] Emergency Department at [facility] ED from 08/04/2020 in [facility] Emergency  Department at [place] No Risk No Risk No Risk        Review of Systems:  Review of Systems  Musculoskeletal:  Negative for gait problem.  Neurological:  Negative for tremors.  Psychiatric/Behavioral:         Please refer to HPI    Medications: I have reviewed the patient's current medications.  Current Outpatient Medications  Medication Sig Dispense Refill   Adalimumab (HUMIRA, 2 PEN,) 40 MG/0.4ML PNKT Inject 40 mg subcutaneously every 14 (fourteen) days 2 each 5   albuterol (VENTOLIN HFA) 108 (90 Base) MCG/ACT inhaler Inhale 2 puffs into the lungs every 6 (six) hours as needed for wheezing or shortness of breath. 8.5 g 0   ALPRAZolam (XANAX) 0.5 MG tablet Take 1 tablet (0.5 mg total) by mouth 3 (three) times daily as needed. 90 tablet 2   amLODipine (NORVASC) 5 MG tablet Take 1 tablet (5 mg total) by mouth daily. 90 tablet 0   buPROPion (WELLBUTRIN XL) 150 MG 24 hr tablet TAKE 1 TABLET BY MOUTH DAILY AFTER BREAKFAST 90 tablet 3   cyclobenzaprine (FLEXERIL) 5 MG tablet Take 1-2 tablets (5-10 mg total) by mouth 2 (two) times daily as needed (migraine headache - sedation precautions). 30 tablet 1   drospirenone-ethinyl estradiol (JASMIEL) 3-0.02 MG tablet TAKE 1 TABLET DAILY, CONTINUOUS ACTIVE PILLS 84 tablet 3   fluticasone (FLOVENT HFA) 110 MCG/ACT inhaler Inhale 1 puff into the lungs in the morning and at bedtime. 12 g 11   lisdexamfetamine (VYVANSE) 60 MG capsule TAKE 1 CAPSULE BY MOUTH DAILY.  90 capsule 0   lisinopril (ZESTRIL) 5 MG tablet Take 1 tablet (5 mg total) by mouth daily. 30 tablet 6   pantoprazole (PROTONIX) 40 MG tablet Take 1 tablet by mouth daily 90 tablet 1   Rimegepant Sulfate (NURTEC) 75 MG TBDP Take 75 mg by mouth daily as needed. For migraines. Take as close to onset of migraine as possible. One daily maximum. 8 tablet 6   No current facility-administered medications for this visit.    Medication Side Effects: None  Allergies:   Allergies  Allergen Reactions   Advil [Ibuprofen] Other (See Comments)    Stroke like ibuprofen   Aspirin Hives and Rash   Sulfonamide Derivatives Hives and Rash    Past Medical History:  Diagnosis Date   Anxiety 10/06/2010   Asthma 10/06/2010   ATTENTION DEFICIT HYPERACTIVITY DISORDER, 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   DEPRESSION 07/14/2007   Diabetes mellitus without complication ([place])    gestational   Essential hypertension, benign 03/24/2010   GERD (gastroesophageal reflux disease)    Helicobacter pylori ab+ 06/2021   treated for H pylori due to IgG +. 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   Please see review of systems for further details on the patient's review from today.   Objective:   Physical Exam:  There were no vitals taken for this visit.  Physical Exam Constitutional:      General: She is not in acute distress. Musculoskeletal:        General: No deformity.  Neurological:     Mental Status: She is alert and oriented to person, place, and time.     Coordination: Coordination normal.  Psychiatric:        Attention and Perception: Attention and perception normal. She does not perceive auditory or visual hallucinations.        Mood and Affect: Mood normal. Mood is not anxious or depressed. Affect is not labile, blunt, angry or inappropriate.        Speech: Speech normal.        Behavior: Behavior normal.        Thought Content: Thought content normal. Thought content is not paranoid or delusional. Thought content does not include homicidal or suicidal ideation. Thought content does not include homicidal or suicidal plan.        Cognition and Memory: Cognition and memory normal.        Judgment: Judgment normal.      Comments: Insight intact     Lab Review:     Component Value Date/Time   NA 137 12/26/2021 1854   NA 141 06/16/2021 1602   K 3.4 (L) 12/26/2021 1854   CL 104 12/26/2021 1854   CO2 25 12/26/2021 1854   GLUCOSE 118 (H) 12/26/2021 1854   BUN 14 12/26/2021 1854   BUN 9 06/16/2021 1602   CREATININE 0.70 12/26/2021 1854   CALCIUM 9.3 12/26/2021 1854   PROT 8.2 (H) 12/26/2021 1854   PROT 7.6 06/16/2021 1602   ALBUMIN 4.1 12/26/2021 1854   ALBUMIN 4.6 06/16/2021 1602   AST 13 (L) 12/26/2021 1854   ALT 8 12/26/2021 1854   ALKPHOS 40 12/26/2021 1854   BILITOT 0.6 12/26/2021 1854   BILITOT 0.3 06/16/2021 1602   GFRNONAA >60 12/26/2021 1854  GFRAA 113 09/28/2019 1610       Component Value Date/Time   WBC 9.7 12/26/2021 1854   RBC 4.34 12/26/2021 1854   HGB 13.0 12/26/2021 1854   HGB 13.3 06/16/2021 1602   HCT 37.2 12/26/2021 1854   HCT 37.0 06/16/2021 1602   PLT 328 12/26/2021 1854   PLT 348 06/16/2021 1602   MCV 85.7 12/26/2021 1854   MCV 85 06/16/2021 1602   MCH 30.0 12/26/2021 1854   MCHC 34.9 12/26/2021 1854   RDW 11.9 12/26/2021 1854   RDW 11.7 06/16/2021 1602   LYMPHSABS 4.1 (H) 12/26/2021 1854   LYMPHSABS 3.5 (H) 06/16/2021 1602   MONOABS 0.6 12/26/2021 1854   EOSABS 0.2 12/26/2021 1854   EOSABS 0.1 06/16/2021 1602   BASOSABS 0.1 12/26/2021 1854   BASOSABS 0.0 06/16/2021 1602    No results found for: "POCLITH", "LITHIUM"   No results found for: "PHENYTOIN", "PHENOBARB", "VALPROATE", "CBMZ"   .res Assessment: Plan:    Plan:  PDMP reviewed  1. Xanax 0.'5mg'$  BID to TID 2. Vyvanse '60mg'$  daily 3. Wellbutrin XL '150mg'$  daily  BP WNL   Monitor BP between visits while taking stimulant medication.   Read and reviewed note with patient for accuracy.   RTC 6 months - will call in 3 months for refills.  Patient advised to contact office with any questions, adverse effects, or acute worsening in signs and symptoms.  Discussed potential benefits, risks, and  side effects of stimulants with patient to include increased heart rate, palpitations, insomnia, increased anxiety, increased irritability, or decreased appetite.  Instructed patient to contact office if experiencing any significant tolerability issues.  Diagnoses and all orders for this visit:  Attention-deficit hyperactivity disorder, combined type  Generalized anxiety disorder -     ALPRAZolam (XANAX) 0.5 MG tablet; Take 1 tablet (0.5 mg total) by mouth 3 (three) times daily as needed.  Panic disorder     Please see After Visit Summary for patient specific instructions.  Future Appointments  Date Time Provider [place]  06/20/2022  7:30 AM [facility]  06/27/2022  3:30 PM Ria Bush, MD [facility]    No orders of the defined types were placed in this encounter.   -------------------------------

## 2022-06-06 ENCOUNTER — Other Ambulatory Visit: Payer: Self-pay

## 2022-06-06 MED FILL — Albuterol Sulfate Inhal Aero 108 MCG/ACT (90MCG Base Equiv): RESPIRATORY_TRACT | 25 days supply | Qty: 6.7 | Fill #0 | Status: AC

## 2022-06-08 ENCOUNTER — Other Ambulatory Visit: Payer: Self-pay

## 2022-06-12 ENCOUNTER — Other Ambulatory Visit (HOSPITAL_COMMUNITY): Payer: Self-pay

## 2022-06-12 ENCOUNTER — Other Ambulatory Visit: Payer: Self-pay | Admitting: Internal Medicine

## 2022-06-13 ENCOUNTER — Other Ambulatory Visit: Payer: Self-pay | Admitting: Pharmacist

## 2022-06-13 ENCOUNTER — Other Ambulatory Visit (HOSPITAL_COMMUNITY): Payer: Self-pay

## 2022-06-13 MED ORDER — HUMIRA (2 PEN) 40 MG/0.4ML ~~LOC~~ AJKT: AUTO-INJECTOR | SUBCUTANEOUS | 5 refills | Status: DC

## 2022-06-13 MED ORDER — HUMIRA (2 PEN) 40 MG/0.4ML ~~LOC~~ AJKT
AUTO-INJECTOR | SUBCUTANEOUS | 5 refills | Status: DC
  Filled 2022-06-13 – 2022-07-12 (×3): qty 2, 28d supply, fill #0
  Filled 2022-07-26 – 2022-08-01 (×3): qty 2, 28d supply, fill #1
  Filled 2022-08-23 – 2022-08-28 (×2): qty 2, 28d supply, fill #2
  Filled 2022-09-24: qty 2, 28d supply, fill #3
  Filled 2022-10-22: qty 2, 28d supply, fill #4
  Filled 2022-11-15 (×2): qty 2, 28d supply, fill #5

## 2022-06-15 ENCOUNTER — Other Ambulatory Visit (HOSPITAL_COMMUNITY): Payer: Self-pay

## 2022-06-17 ENCOUNTER — Other Ambulatory Visit: Payer: Self-pay | Admitting: Family Medicine

## 2022-06-17 DIAGNOSIS — M457 Ankylosing spondylitis of lumbosacral region: Secondary | ICD-10-CM

## 2022-06-17 DIAGNOSIS — I10 Essential (primary) hypertension: Secondary | ICD-10-CM

## 2022-06-18 ENCOUNTER — Other Ambulatory Visit (HOSPITAL_COMMUNITY): Payer: Self-pay

## 2022-06-18 ENCOUNTER — Other Ambulatory Visit: Payer: Self-pay

## 2022-06-19 ENCOUNTER — Other Ambulatory Visit (HOSPITAL_COMMUNITY): Payer: Self-pay

## 2022-06-19 ENCOUNTER — Other Ambulatory Visit: Payer: Self-pay

## 2022-06-20 ENCOUNTER — Other Ambulatory Visit (HOSPITAL_COMMUNITY): Payer: Self-pay

## 2022-06-20 ENCOUNTER — Other Ambulatory Visit (INDEPENDENT_AMBULATORY_CARE_PROVIDER_SITE_OTHER): Payer: Commercial Managed Care - PPO

## 2022-06-20 ENCOUNTER — Other Ambulatory Visit: Payer: Self-pay

## 2022-06-20 DIAGNOSIS — I10 Essential (primary) hypertension: Secondary | ICD-10-CM | POA: Diagnosis not present

## 2022-06-20 DIAGNOSIS — M457 Ankylosing spondylitis of lumbosacral region: Secondary | ICD-10-CM

## 2022-06-20 LAB — CBC WITH DIFFERENTIAL/PLATELET
Basophils Absolute: 0 10*3/uL (ref 0.0–0.1)
Basophils Relative: 0.5 % (ref 0.0–3.0)
Eosinophils Absolute: 0.2 10*3/uL (ref 0.0–0.7)
Eosinophils Relative: 3.4 % (ref 0.0–5.0)
HCT: 38.4 % (ref 36.0–46.0)
Hemoglobin: 13.3 g/dL (ref 12.0–15.0)
Lymphocytes Relative: 48.6 % — ABNORMAL HIGH (ref 12.0–46.0)
Lymphs Abs: 3.4 10*3/uL (ref 0.7–4.0)
MCHC: 34.6 g/dL (ref 30.0–36.0)
MCV: 87.5 fl (ref 78.0–100.0)
Monocytes Absolute: 0.4 10*3/uL (ref 0.1–1.0)
Monocytes Relative: 6.3 % (ref 3.0–12.0)
Neutro Abs: 2.9 10*3/uL (ref 1.4–7.7)
Neutrophils Relative %: 41.2 % — ABNORMAL LOW (ref 43.0–77.0)
Platelets: 297 10*3/uL (ref 150.0–400.0)
RBC: 4.39 Mil/uL (ref 3.87–5.11)
RDW: 12.4 % (ref 11.5–15.5)
WBC: 7.1 10*3/uL (ref 4.0–10.5)

## 2022-06-20 LAB — BASIC METABOLIC PANEL
BUN: 15 mg/dL (ref 6–23)
CO2: 26 mEq/L (ref 19–32)
Calcium: 9.5 mg/dL (ref 8.4–10.5)
Chloride: 104 mEq/L (ref 96–112)
Creatinine, Ser: 0.8 mg/dL (ref 0.40–1.20)
GFR: 91.56 mL/min (ref >60.00)
Glucose, Bld: 96 mg/dL (ref 70–99)
Potassium: 3.7 mEq/L (ref 3.5–5.1)
Sodium: 137 mEq/L (ref 135–145)

## 2022-06-20 LAB — SEDIMENTATION RATE: Sed Rate: 8 mm/hr (ref 0–20)

## 2022-06-21 ENCOUNTER — Other Ambulatory Visit (HOSPITAL_COMMUNITY): Payer: Self-pay

## 2022-06-22 ENCOUNTER — Other Ambulatory Visit (HOSPITAL_COMMUNITY): Payer: Self-pay

## 2022-06-22 ENCOUNTER — Other Ambulatory Visit: Payer: Self-pay

## 2022-06-25 ENCOUNTER — Other Ambulatory Visit: Payer: Self-pay

## 2022-06-25 ENCOUNTER — Other Ambulatory Visit (HOSPITAL_COMMUNITY): Payer: Self-pay

## 2022-06-26 ENCOUNTER — Other Ambulatory Visit (HOSPITAL_COMMUNITY): Payer: Self-pay

## 2022-06-27 ENCOUNTER — Ambulatory Visit (INDEPENDENT_AMBULATORY_CARE_PROVIDER_SITE_OTHER): Payer: Commercial Managed Care - PPO | Admitting: Family Medicine

## 2022-06-27 ENCOUNTER — Other Ambulatory Visit: Payer: Self-pay

## 2022-06-27 ENCOUNTER — Encounter: Payer: Self-pay | Admitting: Family Medicine

## 2022-06-27 VITALS — BP 128/76 | HR 94 | Temp 97.2°F | Ht 65.0 in | Wt 150.2 lb

## 2022-06-27 DIAGNOSIS — I10 Essential (primary) hypertension: Secondary | ICD-10-CM | POA: Diagnosis not present

## 2022-06-27 DIAGNOSIS — Z0001 Encounter for general adult medical examination with abnormal findings: Secondary | ICD-10-CM | POA: Diagnosis not present

## 2022-06-27 DIAGNOSIS — M457 Ankylosing spondylitis of lumbosacral region: Secondary | ICD-10-CM | POA: Diagnosis not present

## 2022-06-27 DIAGNOSIS — F411 Generalized anxiety disorder: Secondary | ICD-10-CM

## 2022-06-27 DIAGNOSIS — R109 Unspecified abdominal pain: Secondary | ICD-10-CM | POA: Diagnosis not present

## 2022-06-27 DIAGNOSIS — J4520 Mild intermittent asthma, uncomplicated: Secondary | ICD-10-CM

## 2022-06-27 DIAGNOSIS — R002 Palpitations: Secondary | ICD-10-CM

## 2022-06-27 DIAGNOSIS — K219 Gastro-esophageal reflux disease without esophagitis: Secondary | ICD-10-CM

## 2022-06-27 DIAGNOSIS — I7300 Raynaud's syndrome without gangrene: Secondary | ICD-10-CM

## 2022-06-27 DIAGNOSIS — F902 Attention-deficit hyperactivity disorder, combined type: Secondary | ICD-10-CM | POA: Diagnosis not present

## 2022-06-27 DIAGNOSIS — Z23 Encounter for immunization: Secondary | ICD-10-CM | POA: Diagnosis not present

## 2022-06-27 DIAGNOSIS — G43701 Chronic migraine without aura, not intractable, with status migrainosus: Secondary | ICD-10-CM | POA: Diagnosis not present

## 2022-06-27 DIAGNOSIS — I4510 Unspecified right bundle-branch block: Secondary | ICD-10-CM

## 2022-06-27 LAB — POC URINALSYSI DIPSTICK (AUTOMATED)
Bilirubin, UA: NEGATIVE
Blood, UA: NEGATIVE
Glucose, UA: NEGATIVE
Ketones, UA: NEGATIVE
Leukocytes, UA: NEGATIVE
Nitrite, UA: NEGATIVE
Protein, UA: NEGATIVE
Spec Grav, UA: 1.015 (ref 1.010–1.025)
Urobilinogen, UA: 0.2 E.U./dL
pH, UA: 6 (ref 5.0–8.0)

## 2022-06-27 MED ORDER — PANTOPRAZOLE SODIUM 40 MG PO TBEC
40.0000 mg | DELAYED_RELEASE_TABLET | Freq: Every day | ORAL | 4 refills | Status: DC
Start: 2022-06-27 — End: 2023-07-03
  Filled 2022-06-27 – 2022-09-03 (×2): qty 90, 90d supply, fill #0
  Filled 2022-12-04: qty 90, 90d supply, fill #1
  Filled 2023-03-04: qty 90, 90d supply, fill #2
  Filled 2023-05-30: qty 90, 90d supply, fill #3

## 2022-06-27 MED ORDER — AMLODIPINE BESYLATE 5 MG PO TABS
5.0000 mg | ORAL_TABLET | Freq: Every day | ORAL | 4 refills | Status: DC
  Filled 2022-06-27: qty 90, 90d supply, fill #0
  Filled 2022-07-12 – 2022-10-09 (×2): qty 90, 90d supply, fill #1
  Filled 2023-01-09: qty 90, 90d supply, fill #2
  Filled 2023-04-08: qty 90, 90d supply, fill #3

## 2022-06-27 NOTE — Patient Instructions (Addendum)
We will order Zio patch.  Tdap today  Urinalysis today for right flank pain.  Good to see you today.  Return as needed or in 1 year for next physical.

## 2022-06-27 NOTE — Progress Notes (Unsigned)
Patient ID: Caitlin Burns, female    DOB: May 19, 1980, 42 y.o.   MRN: [id number]  This visit was conducted in person.  BP 128/76   Pulse 94   Temp (!) 97.2 F (36.2 C) (Temporal)   Ht 5' 5"$  (1.651 m)   Wt 150 lb 4 oz (68.2 kg)   LMP  (Within Months) Comment: 02/2022  SpO2 100%   BMI 25.00 kg/m    CC: CPE Subjective:   HPI: Caitlin Burns is a 42 y.o. female presenting on 06/27/2022 for Annual Exam   HTN - stable period on amlodipine 53m daily. She's not been taking lisinopril as BP readings running well controlled.  She notes occ episodes of tachy-palpitations >120 when [place]- this [place] 30 min. Last episode was 2 wks ago, none since. Trigger seems to be prolonged standing. Stays well hydrated. No noted association to stress or caffeine intake. Not associated with dizziness, dyspnea or significant chest pain. H/o normal stress echo 2016 (Johnsie Cancel  Ankylosing spondylitis 72022 followed by rheumatologist Dr PPosey Pronto on Humira 434mevery other week. Known raynaud's disease. Sees Q4 months.    ADHD on vyvanse, xanax, wellbutrin managed by psychiatrist Dr MoDwaine Gale  Migraines - managed with PRN nurtec, now off Ajovy injections - previously saw neuro Dr AhJaynee EaglesAjovy caused hives ?latex reaction. HTN could trigger migraines. Avoiding triptans and NSAID with HTN and raynaud history.    GERD on protonix 4036maily since 2013. She had reassuring EGD 2013 as well.   Preventative: Well woman through GYN Dr TaaRonita Hipps/2023 - normal pap smear Mammogram through GYN - 02/2022 WNL No fmhx breast cancer.  Flu shot [place], 05/2019, no booster Tdap 05/2012, rpt today  Seat belt use discussed Sunscreen use discussed. No changing moles on skin.  Sleep - averages 7  Non smoker  Alcohol - rare  Dentist q6 mo, uses mouth guard  Eye exam yearly - s/p lasik 2003.    Remarried '11 .1 daughter (adopted [id number]).  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 Interim medical history since our last visit reviewed. Allergies and medications reviewed and updated. Outpatient Medications Prior to Visit  Medication Sig Dispense Refill   Adalimumab (HUMIRA, 2 PEN,) 40 MG/0.4ML PNKT Inject 40 mg subcutaneously every 14 (fourteen) days 2 each 5   albuterol (VENTOLIN HFA) 108 (90 Base) MCG/ACT inhaler Inhale 2 puffs into the lungs every 6 (six) hours as needed for wheezing or shortness of breath. 6.7 g 0   ALPRAZolam (XANAX) 0.5 MG tablet Take 1 tablet (0.5 mg total) by mouth 3 (three) times daily as needed. 90 tablet 2   amLODipine (NORVASC) 5 MG tablet Take 1 tablet (5 mg total) by mouth daily. 90 tablet 0   buPROPion (WELLBUTRIN XL) 150 MG 24 hr tablet TAKE 1 TABLET BY MOUTH DAILY AFTER BREAKFAST 90 tablet 3   cyclobenzaprine (FLEXERIL) 5 MG tablet Take 1-2 tablets (5-10 mg total) by mouth 2 (two) times daily as needed (migraine headache - sedation precautions). 30 tablet 1   drospirenone-ethinyl estradiol (JASMIEL) 3-0.02 MG tablet TAKE 1 TABLET DAILY, CONTINUOUS ACTIVE PILLS 84 tablet 3   lisdexamfetamine (VYVANSE) 60  MG capsule TAKE 1 CAPSULE BY MOUTH DAILY. 90 capsule 0   pantoprazole (PROTONIX) 40 MG tablet Take 1 tablet by mouth daily 90 tablet 1   Rimegepant Sulfate (NURTEC) 75 MG TBDP Take 75 mg by mouth daily as needed. For migraines. Take as close to onset of migraine as possible. One daily maximum. 8 tablet 6   lisinopril (ZESTRIL) 5 MG tablet Take 1 tablet (5 mg total) by mouth daily. (Patient not taking: Reported on 06/27/2022) 30 tablet 6   fluticasone (FLOVENT HFA) 110 MCG/ACT inhaler Inhale 1 puff into the lungs in the morning and at bedtime. 12 g  11   No facility-administered medications prior to visit.     Per HPI unless specifically indicated in ROS section below Review of Systems  Constitutional:  Negative for activity change, appetite change, chills, fatigue, fever and unexpected weight change.  HENT:  Negative for hearing loss.   Eyes:  Negative for visual disturbance.  Respiratory:  Negative for cough, chest tightness, shortness of breath and wheezing.   Cardiovascular:  Positive for palpitations. Negative for chest pain and leg swelling.  Gastrointestinal:  Negative for abdominal distention, abdominal pain, blood in stool, constipation, diarrhea, nausea and vomiting.  Genitourinary:  Negative for difficulty urinating and hematuria.  Musculoskeletal:  Negative for arthralgias, myalgias and neck pain.  Skin:  Negative for rash.  Neurological:  Negative for dizziness, seizures, syncope and headaches.  Hematological:  Negative for adenopathy. Does not bruise/bleed easily.  Psychiatric/Behavioral:  Negative for dysphoric mood. The patient is not nervous/anxious.     Objective:  BP 128/76   Pulse 94   Temp (!) 97.2 F (36.2 C) (Temporal)   Ht 5' 5"$  (1.651 m)   Wt 150 lb 4 oz (68.2 kg)   LMP  (Within Months) Comment: 02/2022  SpO2 100%   BMI 25.00 kg/m   Wt Readings from Last 3 Encounters:  06/27/22 150 lb 4 oz (68.2 kg)  01/29/22 147 lb 6 oz (66.8 kg)  12/26/21 140 lb (63.5 kg)      Physical Exam Vitals and nursing note reviewed.  Constitutional:      Appearance: Normal appearance. She is not ill-appearing.  HENT:     Head: Normocephalic and atraumatic.     Right Ear: Tympanic membrane, ear canal and external ear normal. There is no impacted cerumen.     Left Ear: Tympanic membrane, ear canal and external ear normal. There is no impacted cerumen.     Nose: Nose normal.     Mouth/Throat:     Mouth: Mucous membranes are moist.     Pharynx: Oropharynx is clear. No oropharyngeal exudate or posterior oropharyngeal  erythema.  Eyes:     General:        Right eye: No discharge.        Left eye: No discharge.     Extraocular Movements: Extraocular movements intact.     Conjunctiva/sclera: Conjunctivae normal.     Pupils: Pupils are equal, round, and reactive to light.  Neck:     Thyroid: No thyroid mass or thyromegaly.  Cardiovascular:     Rate and Rhythm: Normal rate and regular rhythm.     Pulses: Normal pulses.     Heart sounds: Normal heart sounds. No murmur heard. Pulmonary:     Effort: Pulmonary effort is normal. No respiratory distress.     Breath sounds: Normal breath sounds. No wheezing, rhonchi or rales.  Abdominal:     General: Bowel sounds are normal.  There is no distension.     Palpations: Abdomen is soft. There is no mass.     Tenderness: There is no abdominal tenderness. There is no guarding or rebound.     Hernia: No hernia is present.  Musculoskeletal:     Cervical back: Normal range of motion and neck supple. No rigidity.     Right lower leg: No edema.     Left lower leg: No edema.  Lymphadenopathy:     Cervical: No cervical adenopathy.  Skin:    General: Skin is warm and dry.     Findings: No rash.  Neurological:     General: No focal deficit present.     Mental Status: She is alert. Mental status is at baseline.  Psychiatric:        Mood and Affect: Mood normal.        Behavior: Behavior normal.       Results for orders placed or performed in visit on 06/20/22  CBC with Differential/Platelet  Result Value Ref Range   WBC 7.1 4.0 - 10.5 K/uL   RBC 4.39 3.87 - 5.11 Mil/uL   Hemoglobin 13.3 12.0 - 15.0 g/dL   HCT 38.4 36.0 - 46.0 %   MCV 87.5 78.0 - 100.0 fl   MCHC 34.6 30.0 - 36.0 g/dL   RDW 12.4 11.5 - 15.5 %   Platelets 297.0 150.0 - 400.0 K/uL   Neutrophils Relative % 41.2 (L) 43.0 - 77.0 %   Lymphocytes Relative 48.6 (H) 12.0 - 46.0 %   Monocytes Relative 6.3 3.0 - 12.0 %   Eosinophils Relative 3.4 0.0 - 5.0 %   Basophils Relative 0.5 0.0 - 3.0 %    Neutro Abs 2.9 1.4 - 7.7 K/uL   Lymphs Abs 3.4 0.7 - 4.0 K/uL   Monocytes Absolute 0.4 0.1 - 1.0 K/uL   Eosinophils Absolute 0.2 0.0 - 0.7 K/uL   Basophils Absolute 0.0 0.0 - 0.1 K/uL  Sedimentation rate  Result Value Ref Range   Sed Rate 8 0 - 20 mm/hr  Basic metabolic panel  Result Value Ref Range   Sodium 137 135 - 145 mEq/L   Potassium 3.7 3.5 - 5.1 mEq/L   Chloride 104 96 - 112 mEq/L   CO2 26 19 - 32 mEq/L   Glucose, Bld 96 70 - 99 mg/dL   BUN 15 6 - 23 mg/dL   Creatinine, Ser 0.80 0.40 - 1.20 mg/dL   GFR 91.56 >60.00 mL/min   Calcium 9.5 8.4 - 10.5 mg/dL   Lab Results  Component Value Date   TSH 1.78 01/29/2022    Assessment & Plan:   Problem List Items Addressed This Visit     Essential hypertension     No orders of the defined types were placed in this encounter.   No orders of the defined types were placed in this encounter.   There are no Patient Instructions on file for this visit.  Follow up plan: No follow-ups on file.  Ria Bush, MD

## 2022-06-28 ENCOUNTER — Other Ambulatory Visit: Payer: Self-pay

## 2022-06-28 ENCOUNTER — Ambulatory Visit: Payer: Commercial Managed Care - PPO | Attending: Family Medicine

## 2022-06-28 DIAGNOSIS — R002 Palpitations: Secondary | ICD-10-CM

## 2022-06-28 DIAGNOSIS — I4510 Unspecified right bundle-branch block: Secondary | ICD-10-CM

## 2022-06-28 DIAGNOSIS — R109 Unspecified abdominal pain: Secondary | ICD-10-CM | POA: Insufficient documentation

## 2022-06-28 DIAGNOSIS — R931 Abnormal findings on diagnostic imaging of heart and coronary circulation: Secondary | ICD-10-CM | POA: Insufficient documentation

## 2022-06-28 MED ORDER — NURTEC 75 MG PO TBDP
75.0000 mg | ORAL_TABLET | Freq: Every day | ORAL | 6 refills | Status: DC | PRN
  Filled 2022-06-28 (×2): qty 8, 8d supply, fill #0
  Filled 2022-07-26 – 2022-07-30 (×2): qty 8, 30d supply, fill #0
  Filled 2022-08-01: qty 8, 8d supply, fill #0
  Filled 2022-08-07: qty 8, 16d supply, fill #0
  Filled 2022-09-03: qty 8, 16d supply, fill #1

## 2022-06-28 NOTE — Assessment & Plan Note (Signed)
Notes pulse can increase to >120 while standing at work, episodes last >30 min.  Will order Zio patch for further evaluation.

## 2022-06-28 NOTE — Assessment & Plan Note (Signed)
Stable period on wellbutrin and PRN xanax - followed by psych.

## 2022-06-28 NOTE — Assessment & Plan Note (Signed)
Continues PRN albuterol.

## 2022-06-28 NOTE — Assessment & Plan Note (Signed)
Chronic, stable period only on amlodipine 33m daily - using this for h/o Raynaud's as well.  Continue current regimen.

## 2022-06-28 NOTE — Assessment & Plan Note (Addendum)
H/o this, saw cardiology 2016 with reassuring stress echocardiogram.

## 2022-06-28 NOTE — Assessment & Plan Note (Signed)
-   Continue amlodipine ?

## 2022-06-28 NOTE — Assessment & Plan Note (Signed)
Chronic, stable on daily pantoprazole - s/p EGD 2013.

## 2022-06-28 NOTE — Assessment & Plan Note (Signed)
Chronic, infrequent migraines controlled with PRN nurtec.  Notes elevated BP reading triggers migraines.

## 2022-06-28 NOTE — Assessment & Plan Note (Signed)
Preventative protocols reviewed and updated unless pt declined. Discussed healthy diet and lifestyle.  

## 2022-06-28 NOTE — Assessment & Plan Note (Signed)
Continues Vyvanse followed by psych

## 2022-06-28 NOTE — Assessment & Plan Note (Addendum)
Appreciate rheum care (Dr Posey Pronto) - on humira.

## 2022-06-28 NOTE — Assessment & Plan Note (Signed)
Recent episode of right flank discomfort in h/o kidney stones however no noted gross hematuria and symptoms have resolved with increased water intake - update UA today.

## 2022-07-02 DIAGNOSIS — I4510 Unspecified right bundle-branch block: Secondary | ICD-10-CM

## 2022-07-02 DIAGNOSIS — R002 Palpitations: Secondary | ICD-10-CM | POA: Diagnosis not present

## 2022-07-05 ENCOUNTER — Other Ambulatory Visit (HOSPITAL_COMMUNITY): Payer: Self-pay

## 2022-07-06 ENCOUNTER — Other Ambulatory Visit (HOSPITAL_COMMUNITY): Payer: Self-pay

## 2022-07-12 ENCOUNTER — Other Ambulatory Visit: Payer: Self-pay

## 2022-07-12 ENCOUNTER — Other Ambulatory Visit (HOSPITAL_COMMUNITY): Payer: Self-pay

## 2022-07-18 DIAGNOSIS — M458 Ankylosing spondylitis sacral and sacrococcygeal region: Secondary | ICD-10-CM | POA: Diagnosis not present

## 2022-07-18 DIAGNOSIS — M47819 Spondylosis without myelopathy or radiculopathy, site unspecified: Secondary | ICD-10-CM | POA: Diagnosis not present

## 2022-07-18 DIAGNOSIS — I7300 Raynaud's syndrome without gangrene: Secondary | ICD-10-CM | POA: Diagnosis not present

## 2022-07-18 DIAGNOSIS — Z7960 Long term (current) use of unspecified immunomodulators and immunosuppressants: Secondary | ICD-10-CM | POA: Diagnosis not present

## 2022-07-23 ENCOUNTER — Other Ambulatory Visit: Payer: Self-pay | Admitting: Adult Health

## 2022-07-23 ENCOUNTER — Other Ambulatory Visit: Payer: Self-pay

## 2022-07-23 DIAGNOSIS — F902 Attention-deficit hyperactivity disorder, combined type: Secondary | ICD-10-CM

## 2022-07-23 DIAGNOSIS — I4510 Unspecified right bundle-branch block: Secondary | ICD-10-CM | POA: Diagnosis not present

## 2022-07-23 DIAGNOSIS — R002 Palpitations: Secondary | ICD-10-CM | POA: Diagnosis not present

## 2022-07-24 ENCOUNTER — Other Ambulatory Visit: Payer: Self-pay

## 2022-07-24 ENCOUNTER — Encounter: Payer: Self-pay | Admitting: Pharmacist

## 2022-07-24 ENCOUNTER — Telehealth: Payer: Self-pay | Admitting: Adult Health

## 2022-07-24 MED ORDER — LISDEXAMFETAMINE DIMESYLATE 60 MG PO CHEW
1.0000 | CHEWABLE_TABLET | Freq: Every day | ORAL | 0 refills | Status: DC
  Filled 2022-07-24: qty 90, 90d supply, fill #0

## 2022-07-24 MED FILL — Lisdexamfetamine Dimesylate Cap 60 MG: ORAL | 90 days supply | Qty: 90 | Fill #0 | Status: CN

## 2022-07-24 NOTE — Telephone Encounter (Signed)
[facility] doesn't have Generic Vyvanse in stock but do have chewable Vyvanse in stock. Please send a new script to the pharmacy,  [place]   Phone: [phone]  Fax: [phone]

## 2022-07-24 NOTE — Telephone Encounter (Signed)
Pended.

## 2022-07-26 ENCOUNTER — Other Ambulatory Visit: Payer: Self-pay

## 2022-07-26 ENCOUNTER — Other Ambulatory Visit (HOSPITAL_COMMUNITY): Payer: Self-pay

## 2022-07-28 ENCOUNTER — Encounter: Payer: Self-pay | Admitting: Family Medicine

## 2022-07-30 ENCOUNTER — Other Ambulatory Visit: Payer: Self-pay

## 2022-08-01 ENCOUNTER — Other Ambulatory Visit (HOSPITAL_COMMUNITY): Payer: Self-pay

## 2022-08-01 ENCOUNTER — Other Ambulatory Visit: Payer: Self-pay

## 2022-08-03 ENCOUNTER — Other Ambulatory Visit: Payer: Self-pay

## 2022-08-06 ENCOUNTER — Telehealth: Payer: Commercial Managed Care - PPO | Admitting: Family

## 2022-08-06 DIAGNOSIS — T148XXA Other injury of unspecified body region, initial encounter: Secondary | ICD-10-CM | POA: Diagnosis not present

## 2022-08-06 MED ORDER — AMOXICILLIN-POT CLAVULANATE 875-125 MG PO TABS: 1.0000 | ORAL_TABLET | Freq: Two times a day (BID) | ORAL | 0 refills | Status: DC

## 2022-08-06 NOTE — Progress Notes (Signed)
E-Visit for Simple Cut/Laceration  We are sorry that you have had an injury. Here is how we plan to help!  I have prescribed Augmentin, 875 mg by mouth twice daily for two days  HOME CARE: Clean the cut or scrape - Wash it well with soap and water. * avoid using hydrogen peroxide which may cause tissue damage, or impede wound healing.  Stop the bleeding - If your cut or scrape is bleeding, press a clean cloth or bandage firmly on the area for 20 minutes. You can also help slow the bleeding by holding the cut above the level of your heart.   Put a thin layer of Bacitracin antibiotic ointment on the cut or scrape. (this can be purchased at any local pharmacy- ask your pharmacist if you need assistance)   Cover the cut or scrape with a bandage or gauze. Keep the bandage clean and dry. Change the bandage 1 to 2 times every day until your cut or scrape heals.   Watch for signs that your cut or scrape is infected (redness, drainage, pain, warmth, swelling or fever)  Over the next 48 hours your wound should start to improve with less pain, less swelling and less redness. If you should develop increasing pain, swelling, redness, fever, pus from the wound you should be seen immediately to make sure this is not becoming infected.   WOUND CARE: Please keep a layer of antibiotic ointment (bacitracin preferred) on this wound at least twice a day for the next seven days and keep a sterile dressing over top of it. You may gently clean the wound with warm soap and water between dressing changes.  We strongly recommend that you have a medical provider reevaluate your wound within 2 to 3 days in person to make sure that it is healing appropriately.  Thank you for choosing an e-visit.  Your e-visit answers were reviewed by a board certified advanced clinical practitioner to complete your personal care plan. Depending upon the condition, your plan could have included both over the counter or prescription  medications.  Please review your pharmacy choice. Make sure the pharmacy is open so you can pick up prescription now. If there is a problem, you may contact your provider through [employer] and have the prescription routed to another pharmacy.  Your safety is important to [place]. If you have drug allergies check your prescription carefully.   For the next 24 hours you can use MyChart to ask questions about today's visit, request a non-urgent call back, or ask for a work or school excuse. You will get an email in the next two days asking about your experience. I hope that your e-visit has been valuable and will speed your recovery.  Approximately 5 minutes was spent documenting and reviewing patient's chart.

## 2022-08-07 ENCOUNTER — Other Ambulatory Visit: Payer: Self-pay

## 2022-08-07 ENCOUNTER — Telehealth: Payer: Self-pay | Admitting: Pharmacy Technician

## 2022-08-07 NOTE — Telephone Encounter (Signed)
Patient Advocate Encounter   Received notification that prior authorization for Nurtec 75MG  dispersible tablets is required.   PA submitted on 08/07/2022 [place] [employer] ePA Form Status is pending

## 2022-08-14 ENCOUNTER — Telehealth: Payer: Commercial Managed Care - PPO | Admitting: Family Medicine

## 2022-08-14 ENCOUNTER — Other Ambulatory Visit: Payer: Self-pay

## 2022-08-14 DIAGNOSIS — B379 Candidiasis, unspecified: Secondary | ICD-10-CM | POA: Diagnosis not present

## 2022-08-14 DIAGNOSIS — T3695XA Adverse effect of unspecified systemic antibiotic, initial encounter: Secondary | ICD-10-CM | POA: Diagnosis not present

## 2022-08-14 MED ORDER — FLUCONAZOLE 150 MG PO TABS
150.0000 mg | ORAL_TABLET | Freq: Every day | ORAL | 0 refills | Status: DC
  Filled 2022-08-14: qty 1, 1d supply, fill #0

## 2022-08-14 NOTE — Progress Notes (Signed)

## 2022-08-15 ENCOUNTER — Other Ambulatory Visit: Payer: Self-pay

## 2022-08-15 MED ORDER — FLUCONAZOLE 150 MG PO TABS
150.0000 mg | ORAL_TABLET | Freq: Once | ORAL | 0 refills | Status: AC
  Filled 2022-08-15: qty 1, 1d supply, fill #0

## 2022-08-15 NOTE — Addendum Note (Signed)
Addended by: Bennie Pierini on: 08/15/2022 12:05 PM   Modules accepted: Orders

## 2022-08-19 ENCOUNTER — Telehealth: Payer: Commercial Managed Care - PPO | Admitting: Family

## 2022-08-19 DIAGNOSIS — B379 Candidiasis, unspecified: Secondary | ICD-10-CM

## 2022-08-19 MED ORDER — FLUCONAZOLE 150 MG PO TABS
150.0000 mg | ORAL_TABLET | ORAL | 0 refills | Status: DC | PRN
Start: 2022-08-19 — End: 2022-09-17

## 2022-08-19 NOTE — Progress Notes (Signed)

## 2022-08-20 ENCOUNTER — Telehealth: Payer: Self-pay | Admitting: Family Medicine

## 2022-08-20 NOTE — Telephone Encounter (Signed)
Patient contacted the office regarding labs from 2/14 visit, states that she has spoken to billing and was told the lab work was coded wrong and is not selected as preventative. Patient keeps getting bills from lab appt, was told by billing to contact office regarding this in order for it to be resolved. If needed, advise [phone].

## 2022-08-23 ENCOUNTER — Other Ambulatory Visit (HOSPITAL_COMMUNITY): Payer: Self-pay

## 2022-08-23 ENCOUNTER — Other Ambulatory Visit: Payer: Self-pay

## 2022-08-23 MED ORDER — TERCONAZOLE 0.4 % VA CREA
1.0000 | TOPICAL_CREAM | Freq: Every day | VAGINAL | 0 refills | Status: DC
  Filled 2022-08-23: qty 45, 7d supply, fill #0

## 2022-08-27 ENCOUNTER — Other Ambulatory Visit (HOSPITAL_COMMUNITY): Payer: Self-pay

## 2022-08-27 NOTE — Telephone Encounter (Signed)
Patient notified by telephone that PA has been approved.

## 2022-08-28 ENCOUNTER — Other Ambulatory Visit (HOSPITAL_COMMUNITY): Payer: Self-pay

## 2022-09-03 ENCOUNTER — Other Ambulatory Visit: Payer: Self-pay

## 2022-09-04 ENCOUNTER — Other Ambulatory Visit (HOSPITAL_COMMUNITY): Payer: Self-pay

## 2022-09-17 ENCOUNTER — Ambulatory Visit (INDEPENDENT_AMBULATORY_CARE_PROVIDER_SITE_OTHER)
Admission: RE | Admit: 2022-09-17 | Discharge: 2022-09-17 | Disposition: A | Discharge status: Home / Self Care | Payer: Commercial Managed Care - PPO | Source: Ambulatory Visit | Attending: Family Medicine | Admitting: Family Medicine

## 2022-09-17 ENCOUNTER — Encounter: Payer: Self-pay | Admitting: Family Medicine

## 2022-09-17 ENCOUNTER — Other Ambulatory Visit: Payer: Self-pay

## 2022-09-17 ENCOUNTER — Ambulatory Visit: Payer: Commercial Managed Care - PPO | Admitting: Family Medicine

## 2022-09-17 VITALS — BP 138/84 | HR 93 | Temp 97.7°F | Ht 65.0 in | Wt 154.2 lb

## 2022-09-17 DIAGNOSIS — M457 Ankylosing spondylitis of lumbosacral region: Secondary | ICD-10-CM | POA: Diagnosis not present

## 2022-09-17 DIAGNOSIS — S299XXA Unspecified injury of thorax, initial encounter: Secondary | ICD-10-CM | POA: Insufficient documentation

## 2022-09-17 DIAGNOSIS — R0789 Other chest pain: Secondary | ICD-10-CM | POA: Diagnosis not present

## 2022-09-17 NOTE — Assessment & Plan Note (Signed)
Stable period followed by rheum on Humira.

## 2022-09-17 NOTE — Assessment & Plan Note (Addendum)
Accidental blunt force trauma to left chest wall with immediate pleuritic pain.  Given location concern for costosternal separation however overall reassuring exam.  Check rib films - no obvious fracture noted.  Supportive measures reviewed - continue ice/heat, tylenol, try topical voltaren. Update if not improving with treatment.  Offered tramadol for breakthrough pain - declined

## 2022-09-17 NOTE — Patient Instructions (Addendum)
Xrays today  Continue tylenol, ice, heat, gentle stretching.  Carry cushion to act as buffer.  May try topical over the counter voltaren gel 2-3 times daily to affected area Let us know if not improving with treatment.

## 2022-09-17 NOTE — Progress Notes (Signed)
Ph: [phone] Fax: [phone]   Patient ID: Caitlin Burns, female    DOB: 06-06-1980, 42 y.o.   MRN: [id number]  This visit was conducted in person.  BP 138/84   Pulse 93   Temp 97.7 F (36.5 C) (Temporal)   Ht 5\' 5"  (1.651 m)   Wt 154 lb 4 oz (70 kg)   SpO2 99%   BMI 25.67 kg/m    CC: chest/rib pain  Subjective:   HPI: Caitlin Burns is a 42 y.o. female presenting on 09/17/2022 for Chest Pain (C/o rib pain in upper R chest above breast- especially when taking a deep breath and with certain movements. Started 09/14/22 after squeezing lemons. Felt rib move and something may have popped. Pt applied ice therapy to area, barely helpful. )   Pleuritic R upper chest pain that started 3d ago on Friday after squeezing lemons - felt rib movement/pop and tender since. She was putting pressure on chest with hand - slipped and hit hand onto left chest with significant immediate pain. Painful to breathe, talk, laugh. Constant pain. Feels better if it could "pop". No bruising to skin.   Treating pain with tylenol, ice and heat.   Known ankylosing spondylosis on humira Q2 wk injection.   LMP - ~02/2022. On continuous OCP Caitlin Burns.      Relevant past medical, surgical, family and social history reviewed and updated as indicated. Interim medical history since our last visit reviewed. Allergies and medications reviewed and updated. Outpatient Medications Prior to Visit  Medication Sig Dispense Refill   Adalimumab (HUMIRA, 2 PEN,) 40 MG/0.4ML PNKT Inject 40 mg subcutaneously every 14 (fourteen) days 2 each 5   albuterol (VENTOLIN HFA) 108 (90 Base) MCG/ACT inhaler Inhale 2 puffs into the lungs every 6 (six) hours as needed for wheezing or shortness of breath. 6.7 g 0   ALPRAZolam (XANAX) 0.5 MG tablet Take 1 tablet (0.5 mg total) by mouth 3 (three) times daily as needed. 90 tablet 2   amLODipine (NORVASC) 5 MG tablet Take 1 tablet (5 mg total) by mouth daily. 90 tablet 4   buPROPion  (WELLBUTRIN XL) 150 MG 24 hr tablet TAKE 1 TABLET BY MOUTH DAILY AFTER BREAKFAST 90 tablet 3   cyclobenzaprine (FLEXERIL) 5 MG tablet Take 1-2 tablets (5-10 mg total) by mouth 2 (two) times daily as needed (migraine headache - sedation precautions). 30 tablet 1   drospirenone-ethinyl estradiol (Caitlin Burns) 3-0.02 MG tablet TAKE 1 TABLET DAILY, CONTINUOUS ACTIVE PILLS 84 tablet 3   Lisdexamfetamine Dimesylate 60 MG CHEW Chew 1 tablet (60 mg total) by mouth daily. 90 tablet 0   pantoprazole (PROTONIX) 40 MG tablet Take 1 tablet by mouth daily 90 tablet 4   Rimegepant Sulfate (NURTEC) 75 MG TBDP Take 1 tablet (75 mg total) by mouth daily as needed. For migraines. Take as close to onset of migraine as possible. One daily maximum. 8 tablet 6   terconazole (TERAZOL 7) 0.4 % vaginal cream Place 1 applicator vaginally at bedtime for 7 days 45 g 0   amoxicillin-clavulanate (AUGMENTIN) 875-125 MG tablet Take 1 tablet by mouth 2 (two) times daily. 14 tablet 0   fluconazole (DIFLUCAN) 150 MG tablet Take 1 tablet (150 mg total) by mouth every three (3) days as needed. 2 tablet 0   No facility-administered medications prior to visit.     Per HPI unless specifically indicated in ROS section below Review of Systems  Objective:  BP 138/84   Pulse 93  Temp 97.7 F (36.5 C) (Temporal)   Ht 5\' 5"  (1.651 m)   Wt 154 lb 4 oz (70 kg)   SpO2 99%   BMI 25.67 kg/m   Wt Readings from Last 3 Encounters:  09/17/22 154 lb 4 oz (70 kg)  06/27/22 150 lb 4 oz (68.2 kg)  01/29/22 147 lb 6 oz (66.8 kg)      Physical Exam Vitals and nursing note reviewed.  Constitutional:      Appearance: Normal appearance. She is not ill-appearing.  Cardiovascular:     Rate and Rhythm: Normal rate and regular rhythm.     Pulses: Normal pulses.     Heart sounds: Normal heart sounds. No murmur heard. Pulmonary:     Effort: Pulmonary effort is normal. No respiratory distress.     Breath sounds: Normal breath sounds. No wheezing,  rhonchi or rales.  Chest:     Chest wall: Tenderness present.       Comments:  Point tender to palpation anterior along left 2nd/3rd ribs just lateral to costochondral joint  No bruising or significant swelling to chest wall Neurological:     Mental Status: She is alert.  Psychiatric:        Mood and Affect: Mood normal.        Behavior: Behavior normal.       LONG TERM MONITOR (3-14 DAYS) HR 54 - 163 bpm, average 91 bpm. 3 episodes of nonsustained SVT, longest 10 beats with an average rate of  135 bpm. Rare supraventricular and ventricular ectopy. No sustained arrhythmias. No atrial fibrillation. Symptom trigger episode corresponds to sinus rhythm +/- PAC.   Caitlin Lang T. Lalla Brothers, MD, [facility], [facility] Cardiac Electrophysiology  Assessment & Plan:   Problem List Items Addressed This Visit     Ankylosing spondylitis (HCC)    Stable period followed by rheum on Humira.       Chest wall injury, initial encounter - Primary    Accidental blunt force trauma to left chest wall with immediate pleuritic pain.  Given location concern for costosternal separation however overall reassuring exam.  Check rib films - no obvious fracture noted.  Supportive measures reviewed - continue ice/heat, tylenol, try topical voltaren. Update if not improving with treatment.  Offered tramadol for breakthrough pain - declined      Relevant Orders   DG Ribs Unilateral Left     No orders of the defined types were placed in this encounter.   Orders Placed This Encounter  Procedures   DG Ribs Unilateral Left    Standing Status:   Future    Number of Occurrences:   1    Standing Expiration Date:   09/17/2023    Order Specific Question:   Reason for Exam (SYMPTOM  OR DIAGNOSIS REQUIRED)    Answer:   left 2nd/3rd costochondral pain and rib pain after trauma to chest    Order Specific Question:   Is patient pregnant?    Answer:   No    Order Specific Question:   Preferred imaging location?    Answer:    [facility]    Patient Instructions  Xrays today  Continue tylenol, ice, heat, gentle stretching.  Carry cushion to act as buffer.  May try topical over the counter voltaren gel 2-3 times daily to affected area Let us know if not improving with treatment.   Follow up plan: Return if symptoms worsen or fail to improve.  Eustaquio Boyden, MD

## 2022-09-24 ENCOUNTER — Other Ambulatory Visit (HOSPITAL_COMMUNITY): Payer: Self-pay

## 2022-10-02 ENCOUNTER — Other Ambulatory Visit (HOSPITAL_COMMUNITY): Payer: Self-pay

## 2022-10-09 ENCOUNTER — Other Ambulatory Visit: Payer: Self-pay | Admitting: Adult Health

## 2022-10-09 ENCOUNTER — Other Ambulatory Visit: Payer: Self-pay

## 2022-10-09 DIAGNOSIS — F411 Generalized anxiety disorder: Secondary | ICD-10-CM

## 2022-10-11 ENCOUNTER — Other Ambulatory Visit: Payer: Self-pay

## 2022-10-12 ENCOUNTER — Other Ambulatory Visit: Payer: Self-pay

## 2022-10-12 MED FILL — Alprazolam Tab 0.5 MG: ORAL | 30 days supply | Qty: 90 | Fill #0 | Status: AC

## 2022-10-21 ENCOUNTER — Other Ambulatory Visit: Payer: Self-pay | Admitting: Adult Health

## 2022-10-22 ENCOUNTER — Other Ambulatory Visit: Payer: Self-pay

## 2022-10-22 MED ORDER — LISDEXAMFETAMINE DIMESYLATE 60 MG PO CHEW
1.0000 | CHEWABLE_TABLET | Freq: Every day | ORAL | 0 refills | Status: DC
  Filled 2022-10-22: qty 90, 90d supply, fill #0

## 2022-10-30 ENCOUNTER — Other Ambulatory Visit (HOSPITAL_COMMUNITY): Payer: Self-pay

## 2022-11-14 ENCOUNTER — Other Ambulatory Visit: Payer: Self-pay | Admitting: Oncology

## 2022-11-14 DIAGNOSIS — Z006 Encounter for examination for normal comparison and control in clinical research program: Secondary | ICD-10-CM

## 2022-11-15 ENCOUNTER — Other Ambulatory Visit
Admission: RE | Admit: 2022-11-15 | Discharge: 2022-11-15 | Disposition: A | Discharge status: Home / Self Care | Payer: Self-pay | Attending: Oncology | Admitting: Oncology

## 2022-11-15 ENCOUNTER — Other Ambulatory Visit (HOSPITAL_COMMUNITY): Payer: Self-pay

## 2022-11-15 DIAGNOSIS — Z006 Encounter for examination for normal comparison and control in clinical research program: Secondary | ICD-10-CM | POA: Insufficient documentation

## 2022-11-20 ENCOUNTER — Other Ambulatory Visit: Payer: Self-pay

## 2022-11-20 MED ORDER — DROSPIRENONE-ETHINYL ESTRADIOL 3-0.02 MG PO TABS
1.0000 | ORAL_TABLET | Freq: Every day | ORAL | 1 refills | Status: DC
  Filled 2022-11-20: qty 84, 84d supply, fill #0
  Filled 2023-02-13: qty 84, 84d supply, fill #1

## 2022-11-23 ENCOUNTER — Other Ambulatory Visit (HOSPITAL_COMMUNITY): Payer: Self-pay

## 2022-12-03 ENCOUNTER — Encounter: Payer: Self-pay | Admitting: Adult Health

## 2022-12-03 ENCOUNTER — Other Ambulatory Visit: Payer: Self-pay

## 2022-12-03 ENCOUNTER — Ambulatory Visit: Payer: Commercial Managed Care - PPO | Admitting: Adult Health

## 2022-12-03 DIAGNOSIS — F411 Generalized anxiety disorder: Secondary | ICD-10-CM

## 2022-12-03 DIAGNOSIS — F902 Attention-deficit hyperactivity disorder, combined type: Secondary | ICD-10-CM

## 2022-12-03 DIAGNOSIS — F410 Panic disorder [episodic paroxysmal anxiety] without agoraphobia: Secondary | ICD-10-CM | POA: Diagnosis not present

## 2022-12-03 MED ORDER — LISDEXAMFETAMINE DIMESYLATE 60 MG PO CAPS
60.0000 mg | ORAL_CAPSULE | ORAL | 0 refills | Status: DC
Start: 2022-12-03 — End: 2023-04-23
  Filled 2022-12-03 – 2023-01-14 (×2): qty 90, 90d supply, fill #0
  Filled 2023-01-20: qty 20, 20d supply, fill #0
  Filled 2023-01-21: qty 70, 70d supply, fill #0

## 2022-12-03 MED ORDER — ALPRAZOLAM 0.5 MG PO TABS
0.5000 mg | ORAL_TABLET | Freq: Three times a day (TID) | ORAL | 2 refills | Status: DC | PRN
Start: 2022-12-03 — End: 2023-04-07
  Filled 2022-12-03: qty 90, 30d supply, fill #0
  Filled 2023-01-09: qty 90, 30d supply, fill #1
  Filled 2023-02-13: qty 90, 30d supply, fill #2

## 2022-12-03 MED ORDER — BUPROPION HCL ER (XL) 150 MG PO TB24
150.0000 mg | ORAL_TABLET | Freq: Every day | ORAL | 3 refills | Status: DC
Start: 2022-12-03 — End: 2023-06-05
  Filled 2022-12-03: qty 90, 90d supply, fill #0
  Filled 2023-03-04: qty 90, 90d supply, fill #1
  Filled 2023-05-30: qty 90, 90d supply, fill #2

## 2022-12-03 NOTE — Progress Notes (Signed)
Caitlin Burns [id number] Sep 05, 1980 42 y.o.  Subjective:   Patient ID:  Caitlin Burns is a 42 y.o. (DOB 12-Nov-1980) female.  Chief Complaint: No chief complaint on file.   HPI Caitlin Burns presents to the office today for follow-up of ADHD, GAD and panic disorder.   Describes mood today as "ok". Pleasant. Denies tearfulness. Mood symptoms - denies depression, anxiety, and irritability. Mood is consistent. Stating "I feel like I'm doing good". Feels like medications work well. Family doing well. Stable interest and motivation. Taking medications as prescribed.  Energy levels stable. Active, does not have a regular exercise routine.   Enjoys some usual interests and activities. Married. Lives with husband, 2 children and 4 dogs and 3 cats. Family local. Spending time with family. Appetite adequate. Weight stable - 145 pounds. Sleeps well most nights. Averages 7 hours. Focus and concentration stable with Vyvanse. Completing tasks. Managing aspects of household. 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 Denies SI or HI.  Denies AH or VH. Denies self harm. Denies substance use.  Previous medication trials: Denies    GAD-7    Garment/textile technologist Visit from 06/27/2022 in [facility] [place] [facility] Visit from 01/06/2021 in [facility] at [facility] Office Visit from 01/05/2020 in [facility] at [facility] Visit from 11/06/2019 in [facility] at [facility]  Total GAD-7 Score 0 0 1 2      PHQ2-9    Flowsheet Row Office Visit from 06/27/2022 in [facility] Visit from 01/06/2021 in [facility] at [facility] Office Visit from 01/05/2020 in [facility] at [facility] Office Visit from 11/06/2019 in [facility] at [place]  PHQ-2 Total Score 0 0 0 0  PHQ-9 Total Score -- 0 0 0      Flowsheet Row ED from  12/26/2021 in [facility] at [facility] ED from 05/26/2021 in [facility] Emergency Department at [name] ED from 08/04/2020 in [facility] Emergency Department at [facility]  C-SSRS RISK CATEGORY No Risk No Risk No Risk        Review of Systems:  Review of Systems  Musculoskeletal:  Negative for gait problem.  Neurological:  Negative for tremors.  Psychiatric/Behavioral:         Please refer to HPI    Medications: I have reviewed the patient's current medications.  Current Outpatient Medications  Medication Sig Dispense Refill   adalimumab (HUMIRA, 2 PEN,) 40 MG/0.4ML pen Inject 40 mg subcutaneously every 14 (fourteen) days 2 each 5   albuterol (VENTOLIN HFA) 108 (90 Base) MCG/ACT inhaler Inhale 2 puffs into the lungs every 6 (six) hours as needed for wheezing or shortness of breath. 6.7 g 0   ALPRAZolam (XANAX) 0.5 MG tablet Take 1 tablet (0.5 mg total) by mouth 3 (three) times daily as needed. 90 tablet 2   amLODipine (NORVASC) 5 MG tablet Take 1 tablet (5 mg total) by mouth daily. 90 tablet 4   buPROPion (WELLBUTRIN XL) 150 MG 24 hr tablet TAKE 1 TABLET BY MOUTH DAILY AFTER BREAKFAST 90 tablet 3   cyclobenzaprine (FLEXERIL) 5 MG tablet Take 1-2 tablets (5-10 mg total) by mouth 2 (two) times daily as needed (migraine headache - sedation precautions). 30 tablet 1   drospirenone-ethinyl estradiol (JASMIEL) 3-0.02 MG tablet Take 1 tablet by mouth daily. Continuous active pills only 112 tablet  1   Lisdexamfetamine Dimesylate 60 MG CHEW Chew 1 tablet (60 mg total) by mouth daily. 90 tablet 0   pantoprazole (PROTONIX) 40 MG tablet Take 1 tablet by mouth daily 90 tablet 4   Rimegepant Sulfate (NURTEC) 75 MG TBDP Take 1 tablet (75 mg total) by mouth daily as needed. For migraines. Take as close to onset of migraine as possible. One daily maximum. 8 tablet 6   terconazole (TERAZOL 7) 0.4 % vaginal cream Place 1 applicator vaginally at bedtime for 7  days 45 g 0   No current facility-administered medications for this visit.    Medication Side Effects: None  Allergies:  Allergies  Allergen Reactions   Advil [Ibuprofen] Other (See Comments)    Elevated blood pressures   Aspirin Hives and Rash   Sulfonamide Derivatives Hives and Rash    Past Medical History:  Diagnosis Date   Anxiety 10/06/2010   Asthma 10/06/2010   ATTENTION DEFICIT HYPERACTIVITY DISORDER, 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   DEPRESSION 07/14/2007   Diabetes mellitus without complication (HCC)    gestational   Essential hypertension, benign 03/24/2010   GERD (gastroesophageal reflux disease)    Helicobacter pylori ab+ 06/2021   treated for H pylori due to IgG +. 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   Please see review of systems for further details on the patient's review from today.   Objective:   Physical Exam:  There were no vitals taken for this visit.  Physical Exam Constitutional:      General: She is not in acute distress. Musculoskeletal:        General: No deformity.  Neurological:     Mental Status: She is alert and oriented to person, place, and time.     Coordination: Coordination normal.  Psychiatric:        Attention and Perception: Attention and perception normal. She does not perceive auditory or visual hallucinations.        Mood and Affect: Mood normal. Mood is not anxious or depressed. Affect is not labile, blunt, angry or inappropriate.        Speech: Speech normal.        Behavior: Behavior normal.        Thought Content: Thought content normal. Thought content is not paranoid or delusional. Thought content does not include homicidal or suicidal ideation. Thought content does not include  homicidal or suicidal plan.        Cognition and Memory: Cognition and memory normal.        Judgment: Judgment normal.     Comments: Insight intact     Lab Review:     Component Value Date/Time   NA 137 06/20/2022 0728   NA 141 06/16/2021 1602   K 3.7 06/20/2022 0728   CL 104 06/20/2022 0728   CO2 26 06/20/2022 0728   GLUCOSE 96 06/20/2022 0728   BUN 15 06/20/2022 0728   BUN 9 06/16/2021 1602   CREATININE 0.80 06/20/2022 0728   CALCIUM 9.5 06/20/2022 0728   PROT 8.2 (H) 12/26/2021 1854   PROT 7.6 06/16/2021 1602   ALBUMIN 4.1 12/26/2021 1854   ALBUMIN 4.6 06/16/2021 1602   AST 13 (L) 12/26/2021 1854   ALT 8 12/26/2021 1854   ALKPHOS 40 12/26/2021 1854  BILITOT 0.6 12/26/2021 1854   BILITOT 0.3 06/16/2021 1602   GFRNONAA >60 12/26/2021 1854   GFRAA 113 09/28/2019 1610       Component Value Date/Time   WBC 7.1 06/20/2022 0728   RBC 4.39 06/20/2022 0728   HGB 13.3 06/20/2022 0728   HGB 13.3 06/16/2021 1602   HCT 38.4 06/20/2022 0728   HCT 37.0 06/16/2021 1602   PLT 297.0 06/20/2022 0728   PLT 348 06/16/2021 1602   MCV 87.5 06/20/2022 0728   MCV 85 06/16/2021 1602   MCH 30.0 12/26/2021 1854   MCHC 34.6 06/20/2022 0728   RDW 12.4 06/20/2022 0728   RDW 11.7 06/16/2021 1602   LYMPHSABS 3.4 06/20/2022 0728   LYMPHSABS 3.5 (H) 06/16/2021 1602   MONOABS 0.4 06/20/2022 0728   EOSABS 0.2 06/20/2022 0728   EOSABS 0.1 06/16/2021 1602   BASOSABS 0.0 06/20/2022 0728   BASOSABS 0.0 06/16/2021 1602    No results found for: "POCLITH", "LITHIUM"   No results found for: "PHENYTOIN", "PHENOBARB", "VALPROATE", "CBMZ"   .res Assessment: Plan:    Plan:  PDMP reviewed  1. Xanax 0.5mg  TID 2. Vyvanse 60mg  daily 3. Wellbutrin XL 150mg  daily  BP WNL   Monitor BP between visits while taking stimulant medication.   Read and reviewed note with patient for accuracy.   RTC 6 months - will call in 3 months for refills.  Patient advised to contact office with any  questions, adverse effects, or acute worsening in signs and symptoms.  Discussed potential benefits, risks, and side effects of stimulants with patient to include increased heart rate, palpitations, insomnia, increased anxiety, increased irritability, or decreased appetite.  Instructed patient to contact office if experiencing any significant tolerability issues.  There are no diagnoses linked to this encounter.   Please see After Visit Summary for patient specific instructions.  Future Appointments  Date Time Provider Department Center  12/03/2022  2:40 PM Keegan Ducey, Thereasa Solo, NP [facility] None  06/28/2023  7:30 AM [facility]  07/03/2023  3:30 PM Eustaquio Boyden, MD [facility]    No orders of the defined types were placed in this encounter.   -------------------------------

## 2022-12-04 ENCOUNTER — Other Ambulatory Visit: Payer: Self-pay

## 2022-12-12 ENCOUNTER — Ambulatory Visit: Payer: Commercial Managed Care - PPO | Attending: Family Medicine | Admitting: Pharmacist

## 2022-12-12 ENCOUNTER — Encounter: Payer: Self-pay | Admitting: Family Medicine

## 2022-12-12 DIAGNOSIS — Z79899 Other long term (current) drug therapy: Secondary | ICD-10-CM

## 2022-12-12 NOTE — Progress Notes (Signed)
  S: Patient presents for review of their specialty medication therapy.  Patient is Humira for ankylosing spondylitis. Patient is managed by Dr. Allena Katz for this.   Adherence: confirms  Efficacy: doing well, controlling her pain  Dosing:  Ankylosing spondylitis: SubQ: 40 mg every other week (may continue methotrexate, other nonbiologic DMARDS, corticosteroids, NSAIDs and/or analgesics)  Dose adjustments: Renal: no dose adjustments (has not been studied) Hepatic: no dose adjustments (has not been studied)  Drug-drug interactions: none identified  Screening: TB test: negative  Hepatitis: negative  Monitoring: S/sx of infection: none CBC: wnl/stable 06/20/2022 S/sx of hypersensitivity: none S/sx of malignancy: none S/sx of heart failure: none  Other side effects: none  O:     Lab Results  Component Value Date   WBC 7.1 06/20/2022   HGB 13.3 06/20/2022   HCT 38.4 06/20/2022   MCV 87.5 06/20/2022   PLT 297.0 06/20/2022      Chemistry      Component Value Date/Time   NA 137 06/20/2022 0728   NA 141 06/16/2021 1602   K 3.7 06/20/2022 0728   CL 104 06/20/2022 0728   CO2 26 06/20/2022 0728   BUN 15 06/20/2022 0728   BUN 9 06/16/2021 1602   CREATININE 0.80 06/20/2022 0728      Component Value Date/Time   CALCIUM 9.5 06/20/2022 0728   ALKPHOS 40 12/26/2021 1854   AST 13 (L) 12/26/2021 1854   ALT 8 12/26/2021 1854   BILITOT 0.6 12/26/2021 1854   BILITOT 0.3 06/16/2021 1602       A/P: 1. Medication review: Patient is taking Humira for Ankylosing Spondylitis. Reviewed the medication with the patient, including the following: Humira is a TNF blocking agent indicated for ankylosing spondylitis, Crohn's disease, Hidradenitis suppurativa, psoriatic arthritis, plaque psoriasis, ulcerative colitis, and uveitis. Patient educated on purpose, proper use and potential adverse effects of Humira. Possible adverse effects are increased risk of infections, headache, and injection  site reactions. There is the possibility of an increased risk of malignancy but it is not well understood if this increased risk is due to there medication or the disease state. There are rare cases of pancytopenia and aplastic anemia. For SubQ injection at separate sites in the thigh or lower abdomen (avoiding areas within 2 inches of navel); rotate injection sites. May leave at room temperature for ~15 to 30 minutes prior to use; do not remove cap or cover while allowing product to reach room temperature. Do not use if solution is discolored or contains particulate matter. Do not administer to skin which is red, tender, bruised, hard, or that has scars, stretch marks, or psoriasis plaques. Prefilled pens and syringes are available for use by patients and the full amount of the syringe should be injected (self-administration); the vial is intended for institutional use only. Vials do not contain a preservative; discard unused portion. No recommendations for any changes at this time.   Butch Penny, PharmD, [place], CPP Clinical Pharmacist [facility] [phone]

## 2022-12-14 ENCOUNTER — Encounter: Payer: Self-pay | Admitting: Family Medicine

## 2022-12-14 ENCOUNTER — Other Ambulatory Visit (HOSPITAL_COMMUNITY): Payer: Self-pay

## 2022-12-14 ENCOUNTER — Other Ambulatory Visit: Payer: Self-pay | Admitting: Internal Medicine

## 2022-12-14 ENCOUNTER — Ambulatory Visit: Payer: Commercial Managed Care - PPO | Admitting: Family Medicine

## 2022-12-14 VITALS — BP 120/80 | HR 96 | Temp 97.8°F | Ht 65.0 in | Wt 153.1 lb

## 2022-12-14 DIAGNOSIS — K219 Gastro-esophageal reflux disease without esophagitis: Secondary | ICD-10-CM | POA: Diagnosis not present

## 2022-12-14 LAB — COMPREHENSIVE METABOLIC PANEL
ALT: 7 U/L (ref 0–35)
AST: 10 U/L (ref 0–37)
Albumin: 4 g/dL (ref 3.5–5.2)
Alkaline Phosphatase: 41 U/L (ref 39–117)
BUN: 12 mg/dL (ref 6–23)
CO2: 27 mEq/L (ref 19–32)
Calcium: 9.5 mg/dL (ref 8.4–10.5)
Chloride: 100 mEq/L (ref 96–112)
Creatinine, Ser: 0.81 mg/dL (ref 0.40–1.20)
GFR: 89.9 mL/min (ref >60.00)
Glucose, Bld: 85 mg/dL (ref 70–99)
Potassium: 4 mEq/L (ref 3.5–5.1)
Sodium: 135 mEq/L (ref 135–145)
Total Bilirubin: 0.4 mg/dL (ref 0.2–1.2)
Total Protein: 7.5 g/dL (ref 6.0–8.3)

## 2022-12-14 LAB — CBC WITH DIFFERENTIAL/PLATELET
Basophils Absolute: 0 10*3/uL (ref 0.0–0.1)
Basophils Relative: 0.6 % (ref 0.0–3.0)
Eosinophils Absolute: 0.2 10*3/uL (ref 0.0–0.7)
Eosinophils Relative: 2.6 % (ref 0.0–5.0)
HCT: 37 % (ref 36.0–46.0)
Hemoglobin: 12.8 g/dL (ref 12.0–15.0)
Lymphocytes Relative: 50.2 % — ABNORMAL HIGH (ref 12.0–46.0)
Lymphs Abs: 3.3 10*3/uL (ref 0.7–4.0)
MCHC: 34.7 g/dL (ref 30.0–36.0)
MCV: 86.8 fl (ref 78.0–100.0)
Monocytes Absolute: 0.4 10*3/uL (ref 0.1–1.0)
Monocytes Relative: 6.5 % (ref 3.0–12.0)
Neutro Abs: 2.6 10*3/uL (ref 1.4–7.7)
Neutrophils Relative %: 40.1 % — ABNORMAL LOW (ref 43.0–77.0)
Platelets: 333 10*3/uL (ref 150.0–400.0)
RBC: 4.26 Mil/uL (ref 3.87–5.11)
RDW: 12.6 % (ref 11.5–15.5)
WBC: 6.5 10*3/uL (ref 4.0–10.5)

## 2022-12-14 LAB — LIPASE: Lipase: 32 U/L (ref 11.0–59.0)

## 2022-12-14 NOTE — Progress Notes (Signed)
Patient ID: Caitlin Burns, female    DOB: 1980/11/15, 42 y.o.   MRN: [id number]  This visit was conducted in person.  BP 120/80 (BP Location: Left Arm, Patient Position: Sitting, Cuff Size: Normal)   Pulse 96   Temp 97.8 F (36.6 C) (Temporal)   Ht 5\' 5"  (1.651 m)   Wt 153 lb 2 oz (69.5 kg)   SpO2 100%   BMI 25.48 kg/m    CC:  Chief Complaint  Patient presents with   Gastroesophageal Reflux    Subjective:   HPI: Caitlin Burns is a 42 y.o. female patient of Dr. Sharen Hones presenting on 12/14/2022 for Gastroesophageal Reflux   Reviewed EGD 2013: unremarkable other than GERD... Dr. Marina Goodell  Hx of H pylori last year.  Today she reports she has had severe protonix in the last week, had been better controlled in past. No diet changes.  Burping constantly. Feels burning in chest  She is using Protonix 40 mg daily.   Minimal abdominal pain rarely... feels like when she is hungry she feels stomach is more achy    Slight increase in anxiety given upcoming trip tommorow.      Relevant past medical, surgical, family and social nhistory reviewed and updated as indicated. Interim medical history since our last visit reviewed. Allergies and medications reviewed and updated. Outpatient Medications Prior to Visit  Medication Sig Dispense Refill   adalimumab (HUMIRA, 2 PEN,) 40 MG/0.4ML pen Inject 40 mg subcutaneously every 14 (fourteen) days 2 each 5   albuterol (VENTOLIN HFA) 108 (90 Base) MCG/ACT inhaler Inhale 2 puffs into the lungs every 6 (six) hours as needed for wheezing or shortness of breath. 6.7 g 0   ALPRAZolam (XANAX) 0.5 MG tablet Take 1 tablet (0.5 mg total) by mouth 3 (three) times daily as needed. 90 tablet 2   amLODipine (NORVASC) 5 MG tablet Take 1 tablet (5 mg total) by mouth daily. 90 tablet 4   buPROPion (WELLBUTRIN XL) 150 MG 24 hr tablet Take 1 tablet (150 mg total) by mouth daily after breakfast. 90 tablet 3   cyclobenzaprine (FLEXERIL) 5 MG tablet Take 1-2  tablets (5-10 mg total) by mouth 2 (two) times daily as needed (migraine headache - sedation precautions). 30 tablet 1   drospirenone-ethinyl estradiol (JASMIEL) 3-0.02 MG tablet Take 1 tablet by mouth daily. Continuous active pills only 112 tablet 1   lisdexamfetamine (VYVANSE) 60 MG capsule Take 1 capsule (60 mg total) by mouth every morning. 90 capsule 0   pantoprazole (PROTONIX) 40 MG tablet Take 1 tablet by mouth daily 90 tablet 4   Rimegepant Sulfate (NURTEC) 75 MG TBDP Take 1 tablet (75 mg total) by mouth daily as needed. For migraines. Take as close to onset of migraine as possible. One daily maximum. 8 tablet 6   terconazole (TERAZOL 7) 0.4 % vaginal cream Place 1 applicator vaginally at bedtime for 7 days 45 g 0   No facility-administered medications prior to visit.     Per HPI unless specifically indicated in ROS section below Review of Systems  Constitutional:  Negative for fatigue and fever.  HENT:  Negative for congestion.   Eyes:  Negative for pain.  Respiratory:  Negative for cough and shortness of breath.   Cardiovascular:  Negative for chest pain, palpitations and leg swelling.  Gastrointestinal:  Negative for abdominal pain.  Genitourinary:  Negative for dysuria and vaginal bleeding.  Musculoskeletal:  Negative for back pain.  Neurological:  Negative for syncope,  light-headedness and headaches.  Psychiatric/Behavioral:  Negative for dysphoric mood.    Objective:  BP 120/80 (BP Location: Left Arm, Patient Position: Sitting, Cuff Size: Normal)   Pulse 96   Temp 97.8 F (36.6 C) (Temporal)   Ht 5\' 5"  (1.651 m)   Wt 153 lb 2 oz (69.5 kg)   SpO2 100%   BMI 25.48 kg/m   Wt Readings from Last 3 Encounters:  12/14/22 153 lb 2 oz (69.5 kg)  09/17/22 154 lb 4 oz (70 kg)  06/27/22 150 lb 4 oz (68.2 kg)      Physical Exam Constitutional:      General: She is not in acute distress.    Appearance: Normal appearance. She is well-developed. She is not ill-appearing or  toxic-appearing.  HENT:     Head: Normocephalic.     Right Ear: Hearing, tympanic membrane, ear canal and external ear normal. Tympanic membrane is not erythematous, retracted or bulging.     Left Ear: Hearing, tympanic membrane, ear canal and external ear normal. Tympanic membrane is not erythematous, retracted or bulging.     Nose: No mucosal edema or rhinorrhea.     Right Sinus: No maxillary sinus tenderness or frontal sinus tenderness.     Left Sinus: No maxillary sinus tenderness or frontal sinus tenderness.     Mouth/Throat:     Mouth: Oropharynx is clear and moist and mucous membranes are normal.     Pharynx: Uvula midline.  Eyes:     General: Lids are normal. Lids are everted, no foreign bodies appreciated.     Extraocular Movements: EOM normal.     Conjunctiva/sclera: Conjunctivae normal.     Pupils: Pupils are equal, round, and reactive to light.  Neck:     Thyroid: No thyroid mass or thyromegaly.     Vascular: No carotid bruit.     Trachea: Trachea normal.  Cardiovascular:     Rate and Rhythm: Normal rate and regular rhythm.     Pulses: Normal pulses.     Heart sounds: Normal heart sounds, S1 normal and S2 normal. No murmur heard.    No friction rub. No gallop.  Pulmonary:     Effort: Pulmonary effort is normal. No tachypnea or respiratory distress.     Breath sounds: Normal breath sounds. No decreased breath sounds, wheezing, rhonchi or rales.  Abdominal:     General: Bowel sounds are normal.     Palpations: Abdomen is soft.     Tenderness: There is no abdominal tenderness.  Musculoskeletal:     Cervical back: Normal range of motion and neck supple.  Skin:    General: Skin is warm, dry and intact.     Findings: No rash.  Neurological:     Mental Status: She is alert.  Psychiatric:        Mood and Affect: Mood is not anxious or depressed.        Speech: Speech normal.        Behavior: Behavior normal. Behavior is cooperative.        Thought Content: Thought  content normal.        Cognition and Memory: Cognition and memory normal.        Judgment: Judgment normal.       Results for orders placed or performed in visit on 06/29/22  HM MAMMOGRAPHY  Result Value Ref Range   HM Mammogram 0-4 Bi-Rad 0-4 Bi-Rad, Self Reported Normal  HM PAP SMEAR  Result Value Ref Range  HM Pap smear NILM    HPV, high-risk NEG     Assessment and Plan  Gastroesophageal reflux disease, unspecified whether esophagitis present Assessment & Plan: Chronic with acute flare No change in diet or medication.  Flare may be due to some increased anxiety but given her history of H. pylori she is concerned about this. She is going out of town tomorrow so is unable to collect a stool sample for H. pylori.  She is currently on a PPI so we cannot do a H. pylori breath test today. Recommend treating symptomatically with addition of H2 blocker ranitidine twice daily to her Protonix 40 mg p.o. daily.  Next line we will do lab work to rule out other secondary causes of her symptoms although she does not have significant abdominal pain.  If symptoms are not improving we will move forward with repeating stool test and possible referral to GI for repeat endoscopy.  Orders: -     Comprehensive metabolic panel -     Lipase -     CBC with Differential/Platelet    No follow-ups on file.   Kerby Nora, MD

## 2022-12-14 NOTE — Telephone Encounter (Signed)
Requested medication (s) are due for refill today - yes  Requested medication (s) are on the active medication list -yes  Future visit scheduled -not our patient  Last refill: 06/13/22 #2 5RF  Notes to clinic: non delegated rx- we do not refill for this provider  Requested Prescriptions  Pending Prescriptions Disp Refills   adalimumab (HUMIRA, 2 PEN,) 40 MG/0.4ML pen 2 each 5    Sig: Inject 40 mg subcutaneously every 14 (fourteen) days     Not Delegated - Immunology: Tumor Necrosis Factor Blockers - adalimumab & golimumab Failed - 12/14/2022 10:00 AM      Failed - This refill cannot be delegated      Failed - ALT in normal range and within 180 days    ALT  Date Value Ref Range Status  12/26/2021 8 0 - 44 U/L Final         Failed - AST in normal range and within 180 days    AST  Date Value Ref Range Status  12/26/2021 13 (L) 15 - 41 U/L Final         Passed - HCT in normal range and within 180 days    HCT  Date Value Ref Range Status  06/20/2022 38.4 36.0 - 46.0 % Final   Hematocrit  Date Value Ref Range Status  06/16/2021 37.0 34.0 - 46.6 % Final         Passed - HGB in normal range and within 180 days    Hemoglobin  Date Value Ref Range Status  06/20/2022 13.3 12.0 - 15.0 g/dL Final  78/29/5621 30.8 11.1 - 15.9 g/dL Final         Passed - PLT in normal range and within 180 days    Platelets  Date Value Ref Range Status  06/20/2022 297.0 150.0 - 400.0 K/uL Final  06/16/2021 348 150 - 450 x10E3/uL Final         Passed - WBC in normal range and within 180 days    WBC  Date Value Ref Range Status  06/20/2022 7.1 4.0 - 10.5 K/uL Final         Passed - Valid encounter within last 6 months    Recent Outpatient Visits           2 days ago Encounter for medication review   [facility] [place], Cornelius Moras, RPH-CPP   11 months ago Encounter for medication review   [facility] [place] Chalet, RPH-CPP   1 year ago Encounter for medication review   [facility] [place], Cornelius Moras, RPH-CPP       Future Appointments             In 6 months Eustaquio Boyden, MD [facility] [place] [facility] [place], [facility]               Requested Prescriptions  Pending Prescriptions Disp Refills   adalimumab (HUMIRA, 2 PEN,) 40 MG/0.4ML pen 2 each 5    Sig: Inject 40 mg subcutaneously every 14 (fourteen) days     Not Delegated - Immunology: Tumor Necrosis Factor Blockers - adalimumab & golimumab Failed - 12/14/2022 10:00 AM      Failed - This refill cannot be delegated      Failed - ALT in normal range and within 180 days    ALT  Date Value Ref Range Status  12/26/2021 8  0 - 44 U/L Final         Failed - AST in normal range and within 180 days    AST  Date Value Ref Range Status  12/26/2021 13 (L) 15 - 41 U/L Final         Passed - HCT in normal range and within 180 days    HCT  Date Value Ref Range Status  06/20/2022 38.4 36.0 - 46.0 % Final   Hematocrit  Date Value Ref Range Status  06/16/2021 37.0 34.0 - 46.6 % Final         Passed - HGB in normal range and within 180 days    Hemoglobin  Date Value Ref Range Status  06/20/2022 13.3 12.0 - 15.0 g/dL Final  78/29/5621 30.8 11.1 - 15.9 g/dL Final         Passed - PLT in normal range and within 180 days    Platelets  Date Value Ref Range Status  06/20/2022 297.0 150.0 - 400.0 K/uL Final  06/16/2021 348 150 - 450 x10E3/uL Final         Passed - WBC in normal range and within 180 days    WBC  Date Value Ref Range Status  06/20/2022 7.1 4.0 - 10.5 K/uL Final         Passed - Valid encounter within last 6 months    Recent Outpatient Visits           2 days ago Encounter for medication review   [facility] [place], Cornelius Moras, RPH-CPP   11 months ago Encounter for medication review   [facility] Dru[place]illa Chalet, RPH-CPP   1 year ago Encounter for medication review   [facility] Drucilla Chalet, RPH-CPP       Future Appointments             In 6 months Eustaquio Boyden, MD [facility] [place], [facility]

## 2022-12-14 NOTE — Assessment & Plan Note (Signed)
Chronic with acute flare No change in diet or medication.  Flare may be due to some increased anxiety but given her history of H. pylori she is concerned about this. She is going out of town tomorrow so is unable to collect a stool sample for H. pylori.  She is currently on a PPI so we cannot do a H. pylori breath test today. Recommend treating symptomatically with addition of H2 blocker ranitidine twice daily to her Protonix 40 mg p.o. daily.  Next line we will do lab work to rule out other secondary causes of her symptoms although she does not have significant abdominal pain.  If symptoms are not improving we will move forward with repeating stool test and possible referral to GI for repeat endoscopy.

## 2022-12-21 ENCOUNTER — Other Ambulatory Visit (HOSPITAL_COMMUNITY): Payer: Self-pay

## 2022-12-24 ENCOUNTER — Other Ambulatory Visit: Payer: Self-pay

## 2022-12-24 ENCOUNTER — Encounter (HOSPITAL_COMMUNITY): Payer: Self-pay

## 2022-12-24 ENCOUNTER — Other Ambulatory Visit: Payer: Self-pay | Admitting: Pharmacist

## 2022-12-24 ENCOUNTER — Other Ambulatory Visit (HOSPITAL_COMMUNITY): Payer: Self-pay

## 2022-12-24 MED ORDER — HUMIRA (2 PEN) 40 MG/0.4ML ~~LOC~~ AJKT: AUTO-INJECTOR | SUBCUTANEOUS | 5 refills | Status: DC

## 2022-12-24 MED ORDER — HUMIRA (2 PEN) 40 MG/0.4ML ~~LOC~~ AJKT
AUTO-INJECTOR | SUBCUTANEOUS | 5 refills | Status: DC
  Filled 2022-12-24: qty 2, 28d supply, fill #0
  Filled 2023-01-14 – 2023-01-17 (×2): qty 2, 28d supply, fill #1
  Filled 2023-02-12: qty 2, 28d supply, fill #2
  Filled 2023-03-13: qty 2, 28d supply, fill #3
  Filled 2023-04-03: qty 2, 28d supply, fill #4
  Filled 2023-05-06: qty 2, 28d supply, fill #5

## 2023-01-09 ENCOUNTER — Other Ambulatory Visit: Payer: Self-pay

## 2023-01-14 ENCOUNTER — Encounter (HOSPITAL_COMMUNITY): Payer: Self-pay

## 2023-01-14 ENCOUNTER — Other Ambulatory Visit (HOSPITAL_COMMUNITY): Payer: Self-pay

## 2023-01-14 ENCOUNTER — Other Ambulatory Visit: Payer: Self-pay

## 2023-01-15 ENCOUNTER — Other Ambulatory Visit (HOSPITAL_COMMUNITY): Payer: Self-pay

## 2023-01-17 ENCOUNTER — Other Ambulatory Visit (HOSPITAL_COMMUNITY): Payer: Self-pay

## 2023-01-20 ENCOUNTER — Other Ambulatory Visit: Payer: Self-pay

## 2023-01-21 ENCOUNTER — Other Ambulatory Visit: Payer: Self-pay

## 2023-01-21 DIAGNOSIS — M458 Ankylosing spondylitis sacral and sacrococcygeal region: Secondary | ICD-10-CM | POA: Diagnosis not present

## 2023-01-21 DIAGNOSIS — Z7960 Long term (current) use of unspecified immunomodulators and immunosuppressants: Secondary | ICD-10-CM | POA: Diagnosis not present

## 2023-01-21 DIAGNOSIS — I7300 Raynaud's syndrome without gangrene: Secondary | ICD-10-CM | POA: Diagnosis not present

## 2023-01-21 DIAGNOSIS — M47819 Spondylosis without myelopathy or radiculopathy, site unspecified: Secondary | ICD-10-CM | POA: Diagnosis not present

## 2023-01-22 ENCOUNTER — Other Ambulatory Visit: Payer: Self-pay

## 2023-01-26 ENCOUNTER — Encounter (HOSPITAL_COMMUNITY): Payer: Self-pay

## 2023-02-06 ENCOUNTER — Other Ambulatory Visit (HOSPITAL_COMMUNITY): Payer: Self-pay

## 2023-02-07 ENCOUNTER — Other Ambulatory Visit (HOSPITAL_COMMUNITY): Payer: Self-pay

## 2023-02-07 ENCOUNTER — Telehealth: Payer: Self-pay | Admitting: Pharmacist

## 2023-02-07 NOTE — Telephone Encounter (Signed)
Pharmacy Patient Advocate Encounter   Received notification from CoverMyMeds that prior authorization for Nurtec 75MG  dispersible tablets is required/requested.   Insurance verification completed.   The patient is insured through [facility] .   Per test claim: PA required; PA submitted to [facility] via CoverMyMeds Key/confirmation #/EOC [id number] Status is pending

## 2023-02-12 ENCOUNTER — Other Ambulatory Visit (HOSPITAL_COMMUNITY): Payer: Self-pay | Admitting: Pharmacy Technician

## 2023-02-12 ENCOUNTER — Other Ambulatory Visit (HOSPITAL_COMMUNITY): Payer: Self-pay

## 2023-02-12 NOTE — Progress Notes (Signed)
Specialty Pharmacy Refill Coordination Note  Caitlin Burns is a 42 y.o. female contacted today regarding refills of specialty medication(s) Adalimumab   Patient requested Delivery   Delivery date: 02/20/23   Verified address: [address] [place]   Medication will be filled on 02/19/23.

## 2023-02-12 NOTE — Telephone Encounter (Signed)
Pharmacy Patient Advocate Encounter  Received notification from [facility] that Prior Authorization for Nurtec 75mg  has been APPROVED from 02/07/23 to 02/06/24   PA #/Case ID/Reference #: [id number]

## 2023-02-13 ENCOUNTER — Other Ambulatory Visit: Payer: Self-pay

## 2023-03-04 ENCOUNTER — Other Ambulatory Visit: Payer: Self-pay

## 2023-03-13 ENCOUNTER — Other Ambulatory Visit: Payer: Self-pay

## 2023-03-13 NOTE — Progress Notes (Signed)
Specialty Pharmacy Ongoing Clinical Assessment Note  Caitlin Burns is a 42 y.o. female who is being followed by the specialty pharmacy service for RxSp Ankylosing Spondylitis   Patient's specialty medication(s) reviewed today: Adalimumab   Missed doses in the last 4 weeks: 0   Patient/Caregiver did not have any additional questions or concerns.   Therapeutic benefit summary: Patient is achieving benefit   Adverse events/side effects summary: No adverse events/side effects   Patient's therapy is appropriate to: Continue    Goals Addressed             This Visit's Progress    Minimize recurrence of flares       Patient is on track. Patient will maintain adherence         Follow up:  6 months  Sumit Branham E [facility] Specialty Pharmacist

## 2023-03-13 NOTE — Progress Notes (Signed)
Specialty Pharmacy Refill Coordination Note  Caitlin Burns is a 42 y.o. female contacted today regarding refills of specialty medication(s) Adalimumab   Patient requested Delivery   Delivery date: 03/20/23   Verified address: [address]   [place] [address]   Medication will be filled on 03/19/23.

## 2023-04-03 ENCOUNTER — Other Ambulatory Visit (HOSPITAL_COMMUNITY): Payer: Self-pay | Admitting: Pharmacy Technician

## 2023-04-03 ENCOUNTER — Other Ambulatory Visit (HOSPITAL_COMMUNITY): Payer: Self-pay

## 2023-04-03 NOTE — Progress Notes (Signed)
Specialty Pharmacy Refill Coordination Note  Caitlin Burns is a 42 y.o. female contacted today regarding refills of specialty medication(s) Adalimumab   Patient requested Delivery   Delivery date: 04/16/23   Verified address: Patient address [address]  [place]   Medication will be filled on 04/15/23.

## 2023-04-07 ENCOUNTER — Other Ambulatory Visit: Payer: Self-pay | Admitting: Adult Health

## 2023-04-07 DIAGNOSIS — F410 Panic disorder [episodic paroxysmal anxiety] without agoraphobia: Secondary | ICD-10-CM

## 2023-04-07 DIAGNOSIS — F411 Generalized anxiety disorder: Secondary | ICD-10-CM

## 2023-04-08 ENCOUNTER — Other Ambulatory Visit: Payer: Self-pay

## 2023-04-08 MED ORDER — ALPRAZOLAM 0.5 MG PO TABS
0.5000 mg | ORAL_TABLET | Freq: Three times a day (TID) | ORAL | 0 refills | Status: DC | PRN
  Filled 2023-04-08: qty 90, 30d supply, fill #0

## 2023-04-08 NOTE — Telephone Encounter (Signed)
LF 10/9; LV 07/29; NV 01/29

## 2023-04-15 ENCOUNTER — Other Ambulatory Visit: Payer: Self-pay

## 2023-04-18 ENCOUNTER — Other Ambulatory Visit: Payer: Self-pay

## 2023-04-18 DIAGNOSIS — Z113 Encounter for screening for infections with a predominantly sexual mode of transmission: Secondary | ICD-10-CM | POA: Diagnosis not present

## 2023-04-18 DIAGNOSIS — Z1331 Encounter for screening for depression: Secondary | ICD-10-CM | POA: Diagnosis not present

## 2023-04-18 DIAGNOSIS — Z01419 Encounter for gynecological examination (general) (routine) without abnormal findings: Secondary | ICD-10-CM | POA: Diagnosis not present

## 2023-04-18 DIAGNOSIS — Z1231 Encounter for screening mammogram for malignant neoplasm of breast: Secondary | ICD-10-CM | POA: Diagnosis not present

## 2023-04-18 DIAGNOSIS — Z124 Encounter for screening for malignant neoplasm of cervix: Secondary | ICD-10-CM | POA: Diagnosis not present

## 2023-04-18 DIAGNOSIS — Z01411 Encounter for gynecological examination (general) (routine) with abnormal findings: Secondary | ICD-10-CM | POA: Diagnosis not present

## 2023-04-18 LAB — HM MAMMOGRAPHY

## 2023-04-18 MED ORDER — DROSPIRENONE-ETHINYL ESTRADIOL 3-0.02 MG PO TABS
1.0000 | ORAL_TABLET | Freq: Every day | ORAL | 3 refills | Status: DC
  Filled 2023-04-18: qty 84, 63d supply, fill #0
  Filled 2023-09-16: qty 84, 63d supply, fill #1
  Filled 2023-11-24: qty 84, 63d supply, fill #2
  Filled 2024-01-19: qty 84, 63d supply, fill #3

## 2023-04-23 ENCOUNTER — Other Ambulatory Visit: Payer: Self-pay | Admitting: Adult Health

## 2023-04-23 ENCOUNTER — Other Ambulatory Visit: Payer: Self-pay

## 2023-04-23 DIAGNOSIS — F902 Attention-deficit hyperactivity disorder, combined type: Secondary | ICD-10-CM

## 2023-04-23 DIAGNOSIS — F411 Generalized anxiety disorder: Secondary | ICD-10-CM

## 2023-04-23 MED ORDER — LISDEXAMFETAMINE DIMESYLATE 60 MG PO CAPS
60.0000 mg | ORAL_CAPSULE | ORAL | 0 refills | Status: DC
Start: 2023-04-23 — End: 2023-06-05
  Filled 2023-04-23: qty 90, 90d supply, fill #0

## 2023-04-23 NOTE — Telephone Encounter (Signed)
LF 09/16; LV 07/29 NV 01/29

## 2023-04-27 ENCOUNTER — Telehealth: Payer: Commercial Managed Care - PPO | Admitting: Family Medicine

## 2023-04-27 DIAGNOSIS — H1031 Unspecified acute conjunctivitis, right eye: Secondary | ICD-10-CM

## 2023-04-27 MED ORDER — OFLOXACIN 0.3 % OP SOLN: 2.0000 [drp] | Freq: Four times a day (QID) | OPHTHALMIC | 0 refills | Status: AC

## 2023-04-27 NOTE — Progress Notes (Signed)

## 2023-05-06 ENCOUNTER — Other Ambulatory Visit: Payer: Self-pay | Admitting: Adult Health

## 2023-05-06 ENCOUNTER — Other Ambulatory Visit (HOSPITAL_COMMUNITY): Payer: Self-pay

## 2023-05-06 ENCOUNTER — Other Ambulatory Visit (HOSPITAL_COMMUNITY): Payer: Self-pay | Admitting: Pharmacy Technician

## 2023-05-06 ENCOUNTER — Other Ambulatory Visit: Payer: Self-pay

## 2023-05-06 DIAGNOSIS — F411 Generalized anxiety disorder: Secondary | ICD-10-CM

## 2023-05-06 DIAGNOSIS — F410 Panic disorder [episodic paroxysmal anxiety] without agoraphobia: Secondary | ICD-10-CM

## 2023-05-06 MED ORDER — ALPRAZOLAM 0.5 MG PO TABS
0.5000 mg | ORAL_TABLET | Freq: Three times a day (TID) | ORAL | 0 refills | Status: DC | PRN
  Filled 2023-05-06: qty 90, 30d supply, fill #0

## 2023-05-06 NOTE — Telephone Encounter (Signed)
LF 12/2; LV 07/29 NV 1/29

## 2023-05-06 NOTE — Progress Notes (Signed)
Specialty Pharmacy Refill Coordination Note  Caitlin Burns is a 42 y.o. female contacted today regarding refills of specialty medication(s) Adalimumab (Humira (2 Pen))   Patient requested Delivery   Delivery date: 05/14/23   Verified address: [address]  [place]   Medication will be filled on 05/13/23.

## 2023-05-13 ENCOUNTER — Other Ambulatory Visit: Payer: Self-pay

## 2023-05-30 ENCOUNTER — Other Ambulatory Visit: Payer: Self-pay

## 2023-06-04 ENCOUNTER — Other Ambulatory Visit (HOSPITAL_COMMUNITY): Payer: Self-pay

## 2023-06-05 ENCOUNTER — Encounter: Payer: Self-pay | Admitting: Adult Health

## 2023-06-05 ENCOUNTER — Other Ambulatory Visit: Payer: Self-pay

## 2023-06-05 ENCOUNTER — Telehealth: Payer: Commercial Managed Care - PPO | Admitting: Adult Health

## 2023-06-05 DIAGNOSIS — F411 Generalized anxiety disorder: Secondary | ICD-10-CM | POA: Diagnosis not present

## 2023-06-05 DIAGNOSIS — F410 Panic disorder [episodic paroxysmal anxiety] without agoraphobia: Secondary | ICD-10-CM | POA: Diagnosis not present

## 2023-06-05 DIAGNOSIS — F909 Attention-deficit hyperactivity disorder, unspecified type: Secondary | ICD-10-CM

## 2023-06-05 DIAGNOSIS — F902 Attention-deficit hyperactivity disorder, combined type: Secondary | ICD-10-CM

## 2023-06-05 MED ORDER — BUPROPION HCL ER (XL) 150 MG PO TB24
150.0000 mg | ORAL_TABLET | Freq: Every day | ORAL | 3 refills | Status: DC
Start: 2023-06-05 — End: 2023-11-15
  Filled 2023-06-05 – 2023-08-29 (×2): qty 90, 90d supply, fill #0

## 2023-06-05 MED ORDER — ALPRAZOLAM 0.5 MG PO TABS
0.5000 mg | ORAL_TABLET | Freq: Three times a day (TID) | ORAL | 2 refills | Status: DC | PRN
  Filled 2023-06-05: qty 90, 30d supply, fill #0
  Filled 2023-07-29: qty 90, 30d supply, fill #1
  Filled 2023-09-30: qty 90, 30d supply, fill #2

## 2023-06-05 MED ORDER — LISDEXAMFETAMINE DIMESYLATE 60 MG PO CAPS
60.0000 mg | ORAL_CAPSULE | ORAL | 0 refills | Status: DC
  Filled 2023-06-05 – 2023-07-22 (×2): qty 90, 90d supply, fill #0
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
## 2023-06-07 ENCOUNTER — Other Ambulatory Visit: Payer: Self-pay

## 2023-06-07 ENCOUNTER — Other Ambulatory Visit (HOSPITAL_COMMUNITY): Payer: Self-pay

## 2023-06-07 NOTE — Progress Notes (Signed)
Specialty Pharmacy Refill Coordination Note  Caitlin Burns is a 43 y.o. female contacted today regarding refills of specialty medication(s) Adalimumab (Humira (2 Pen))   Patient requested Delivery   Delivery date: 06/12/23   Verified address: [address]   [place] [address]   Medication will be filled on 06/11/23.   Pending refill request. Rewrite required.

## 2023-06-10 ENCOUNTER — Other Ambulatory Visit: Payer: Self-pay

## 2023-06-10 ENCOUNTER — Other Ambulatory Visit: Payer: Self-pay | Admitting: Pharmacist

## 2023-06-10 MED ORDER — HUMIRA (2 PEN) 40 MG/0.4ML ~~LOC~~ AJKT
AUTO-INJECTOR | SUBCUTANEOUS | 5 refills | Status: DC
  Filled 2023-06-10 – 2023-06-20 (×2): qty 2, 28d supply, fill #0
  Filled 2023-07-15: qty 2, 28d supply, fill #1
  Filled 2023-08-14: qty 2, 28d supply, fill #2
  Filled 2023-09-03: qty 2, 28d supply, fill #3
  Filled 2023-10-02: qty 2, 28d supply, fill #4
  Filled 2023-11-06: qty 2, 28d supply, fill #5

## 2023-06-10 MED ORDER — HUMIRA (2 PEN) 40 MG/0.4ML ~~LOC~~ AJKT
AUTO-INJECTOR | SUBCUTANEOUS | 5 refills | Status: DC
  Filled 2023-06-10: qty 2, 28d supply, fill #0

## 2023-06-11 ENCOUNTER — Other Ambulatory Visit: Payer: Self-pay

## 2023-06-11 NOTE — Progress Notes (Signed)
 Pharmacy Patient Advocate Encounter   Received notification from Patient Pharmacy that prior authorization for Humira  CF is required/requested.   Insurance verification completed.   The patient is insured through [facility] .   Per test claim: PA required; PA started via CoverMyMeds. KEY [id number] . Waiting for clinical questions to populate.

## 2023-06-12 ENCOUNTER — Other Ambulatory Visit: Payer: Self-pay

## 2023-06-12 ENCOUNTER — Other Ambulatory Visit (HOSPITAL_COMMUNITY): Payer: Self-pay

## 2023-06-12 NOTE — Progress Notes (Signed)
 No record of a MedWatch form. Provider indicated that they had submitted a MedWatch form on the PA questionnaire last time, but did not submit a copy. Provider can complete PA and submit MedWatch form or they can switch to preservative free Amjevita  40mg /0.68mL.  Faxed CMM Key [id number] to provider.

## 2023-06-13 ENCOUNTER — Other Ambulatory Visit: Payer: Self-pay

## 2023-06-17 ENCOUNTER — Other Ambulatory Visit (HOSPITAL_COMMUNITY): Payer: Self-pay

## 2023-06-17 ENCOUNTER — Other Ambulatory Visit: Payer: Self-pay

## 2023-06-18 ENCOUNTER — Other Ambulatory Visit: Payer: Self-pay

## 2023-06-18 ENCOUNTER — Other Ambulatory Visit (HOSPITAL_COMMUNITY): Payer: Self-pay

## 2023-06-18 NOTE — Progress Notes (Signed)
Provider has not submitted PA. Will submit through cmm.

## 2023-06-18 NOTE — Progress Notes (Signed)
 Pharmacy Patient Advocate Encounter   Received notification from Patient Pharmacy that prior authorization for Humira  CF is required/requested.   Insurance verification completed.   The patient is insured through [facility] .   Per test claim: PA required; PA submitted to above mentioned insurance via CoverMyMeds Key/confirmation #/EOC [facility] Status is pending

## 2023-06-20 ENCOUNTER — Other Ambulatory Visit (HOSPITAL_COMMUNITY): Payer: Self-pay

## 2023-06-20 ENCOUNTER — Other Ambulatory Visit: Payer: Self-pay

## 2023-06-20 NOTE — Progress Notes (Signed)
Pharmacy Patient Advocate Encounter  Received notification from [facility] that Prior Authorization for Humira CF has been APPROVED from 06/19/23 to 06/17/24   PA #/Case ID/Reference #: [employer]

## 2023-06-23 ENCOUNTER — Other Ambulatory Visit: Payer: Self-pay | Admitting: Family Medicine

## 2023-06-23 DIAGNOSIS — I10 Essential (primary) hypertension: Secondary | ICD-10-CM

## 2023-06-24 ENCOUNTER — Other Ambulatory Visit: Payer: Self-pay

## 2023-06-24 NOTE — Progress Notes (Signed)
Patient called in to let us know one of her Humira pens was defective. Advised her to call [employer] complete [phone]) to report defective pen so they can send her replacement. Also advised her to give [place] a call if she needs any further assistance.

## 2023-06-26 ENCOUNTER — Other Ambulatory Visit: Payer: Self-pay

## 2023-06-26 ENCOUNTER — Other Ambulatory Visit (HOSPITAL_COMMUNITY): Payer: Self-pay

## 2023-06-26 NOTE — Progress Notes (Signed)
Received a call from [facility] requesting lot number for Humira dispensed on 06/20/23 because patient called in for replacement.   Lot [id number]

## 2023-06-28 ENCOUNTER — Other Ambulatory Visit (INDEPENDENT_AMBULATORY_CARE_PROVIDER_SITE_OTHER): Payer: Commercial Managed Care - PPO

## 2023-06-28 DIAGNOSIS — I10 Essential (primary) hypertension: Secondary | ICD-10-CM | POA: Diagnosis not present

## 2023-06-28 LAB — MICROALBUMIN / CREATININE URINE RATIO
Creatinine,U: 330.8 mg/dL
Microalb Creat Ratio: 4.4 mg/g (ref 0.0–30.0)
Microalb, Ur: 1.4 mg/dL (ref 0.0–1.9)

## 2023-06-28 LAB — LIPID PANEL
Cholesterol: 191 mg/dL (ref 0–200)
HDL: 60.7 mg/dL (ref >39.00)
LDL Cholesterol: 103 mg/dL — ABNORMAL HIGH (ref 0–99)
NonHDL: 130.52
Total CHOL/HDL Ratio: 3
Triglycerides: 136 mg/dL (ref 0.0–149.0)
VLDL: 27.2 mg/dL (ref 0.0–40.0)

## 2023-06-28 LAB — COMPREHENSIVE METABOLIC PANEL
ALT: 4 U/L (ref 0–35)
AST: 8 U/L (ref 0–37)
Albumin: 3.9 g/dL (ref 3.5–5.2)
Alkaline Phosphatase: 41 U/L (ref 39–117)
BUN: 11 mg/dL (ref 6–23)
CO2: 28 meq/L (ref 19–32)
Calcium: 9.2 mg/dL (ref 8.4–10.5)
Chloride: 103 meq/L (ref 96–112)
Creatinine, Ser: 0.81 mg/dL (ref 0.40–1.20)
GFR: 89.56 mL/min (ref >60.00)
Glucose, Bld: 101 mg/dL — ABNORMAL HIGH (ref 70–99)
Potassium: 4.3 meq/L (ref 3.5–5.1)
Sodium: 139 meq/L (ref 135–145)
Total Bilirubin: 0.4 mg/dL (ref 0.2–1.2)
Total Protein: 7.5 g/dL (ref 6.0–8.3)

## 2023-06-28 LAB — TSH: TSH: 2.76 u[IU]/mL (ref 0.35–5.50)

## 2023-07-02 ENCOUNTER — Other Ambulatory Visit: Payer: Self-pay

## 2023-07-03 ENCOUNTER — Ambulatory Visit (INDEPENDENT_AMBULATORY_CARE_PROVIDER_SITE_OTHER): Payer: Commercial Managed Care - PPO | Admitting: Family Medicine

## 2023-07-03 ENCOUNTER — Other Ambulatory Visit: Payer: Self-pay

## 2023-07-03 ENCOUNTER — Encounter: Payer: Self-pay | Admitting: Family Medicine

## 2023-07-03 VITALS — BP 120/80 | HR 90 | Temp 98.5°F | Ht 65.25 in | Wt 149.5 lb

## 2023-07-03 DIAGNOSIS — I10 Essential (primary) hypertension: Secondary | ICD-10-CM | POA: Diagnosis not present

## 2023-07-03 DIAGNOSIS — I7300 Raynaud's syndrome without gangrene: Secondary | ICD-10-CM | POA: Diagnosis not present

## 2023-07-03 DIAGNOSIS — J4520 Mild intermittent asthma, uncomplicated: Secondary | ICD-10-CM

## 2023-07-03 DIAGNOSIS — K219 Gastro-esophageal reflux disease without esophagitis: Secondary | ICD-10-CM

## 2023-07-03 DIAGNOSIS — F902 Attention-deficit hyperactivity disorder, combined type: Secondary | ICD-10-CM

## 2023-07-03 DIAGNOSIS — F411 Generalized anxiety disorder: Secondary | ICD-10-CM | POA: Diagnosis not present

## 2023-07-03 DIAGNOSIS — Z0001 Encounter for general adult medical examination with abnormal findings: Secondary | ICD-10-CM | POA: Diagnosis not present

## 2023-07-03 DIAGNOSIS — G43701 Chronic migraine without aura, not intractable, with status migrainosus: Secondary | ICD-10-CM

## 2023-07-03 DIAGNOSIS — M457 Ankylosing spondylitis of lumbosacral region: Secondary | ICD-10-CM | POA: Diagnosis not present

## 2023-07-03 MED ORDER — AMLODIPINE BESYLATE 5 MG PO TABS
5.0000 mg | ORAL_TABLET | Freq: Every day | ORAL | 4 refills | Status: AC
Start: 2023-07-03 — End: ?
  Filled 2023-07-03: qty 90, 90d supply, fill #0
  Filled 2023-09-30: qty 90, 90d supply, fill #1
  Filled 2024-01-01: qty 90, 90d supply, fill #2
  Filled 2024-03-30: qty 90, 90d supply, fill #3

## 2023-07-03 MED ORDER — NURTEC 75 MG PO TBDP
75.0000 mg | ORAL_TABLET | Freq: Every day | ORAL | 6 refills | Status: AC | PRN
  Filled 2023-07-03: qty 8, 16d supply, fill #0
  Filled 2024-02-04: qty 8, 16d supply, fill #1

## 2023-07-03 MED ORDER — PANTOPRAZOLE SODIUM 40 MG PO TBEC
40.0000 mg | DELAYED_RELEASE_TABLET | Freq: Every day | ORAL | 4 refills | Status: AC
  Filled 2023-07-03 – 2023-09-03 (×2): qty 90, 90d supply, fill #0
  Filled 2023-11-28: qty 90, 90d supply, fill #1
  Filled 2024-02-23: qty 90, 90d supply, fill #2
  Filled 2024-06-01: qty 90, 90d supply, fill #3

## 2023-07-03 NOTE — Assessment & Plan Note (Addendum)
 Regularly sees psychiatry on xanax wellbutrin as well as ADHD treatment.

## 2023-07-03 NOTE — Assessment & Plan Note (Signed)
 Chronic, stable on PRN nurtec infrequently.

## 2023-07-03 NOTE — Assessment & Plan Note (Signed)
 Stable period on Humira  Appreciate [facility] rheum care Dr Allena Katz Q6 mo

## 2023-07-03 NOTE — Assessment & Plan Note (Addendum)
 Stable period - uses hand warmers PRN

## 2023-07-03 NOTE — Assessment & Plan Note (Signed)
Chronic, stable period on amlodipine - continue.  °

## 2023-07-03 NOTE — Patient Instructions (Addendum)
 We will request latest mammogram through GYN.  Good to see you today  Return as needed or in 1 year for next physical

## 2023-07-03 NOTE — Progress Notes (Signed)
 Ph: [phone] Fax: [phone]   Patient ID: Caitlin Burns, female    DOB: 12/08/1980, 43 y.o.   MRN: [id number]  This visit was conducted in person.  BP 120/80   Pulse 90   Temp 98.5 F (36.9 C) (Oral)   Ht 5' 5.25" (1.657 m)   Wt 149 lb 8 oz (67.8 kg)   SpO2 98%   BMI 24.69 kg/m    CC: CPE Subjective:   HPI: Caitlin Burns is a 43 y.o. female presenting on 07/03/2023 for Annual Exam   Tachy-palpitations >120 while at work. Zio patch showed episodes of nonsustained supraventricular tachycardia without sustained arrhythmias, as well as rare skipped beats (PACs and PVCs). This has improved.   Ankylosing spondylitis dx 72022 followed by rheumatologist Dr Allena Katz at [facility], on Humira 40mg  every other week. Known raynaud's disease.   ADHD on vyvanse, xanax, wellbutrin managed by psychiatrist Dr Kallie Locks.    Migraines - no recent migraines. Managed with tylenol. Managed with PRN nurtec, off Ajovy injections. Previously saw neuro Dr Lucia Gaskins. Ajovy caused hives ?latex reaction. HTN could trigger migraines. Avoiding triptans and NSAID with HTN and raynaud history.    GERD - takes protonix 40mg  daily since 2013. She had reassuring EGD 2013 as well.   Preventative: Well woman through GYN Dr Billy Coast 04/2023 - normal pap smear LMP 02/2022 - on continuous birth control  Mammogram through GYN - 04/2023 WNL at GYN - will request  No fmhx breast cancer.  Flu shot at work COVID vaccine [employer] 03/2019, 05/2019, no booster Tdap 05/2012, 06/2022  Seat belt use discussed Sunscreen use discussed. No changing moles on skin.  Sleep - averages 6-7 hours/night Non smoker  Alcohol - rare  Dentist q6 mo, uses mouth guard for tooth grinding Eye exam due - s/p lasik 2003.   Caffeine- sodas 2 daily Remarried 2011 .1 adopted daughter from [place] 2006.  Lives with husband Weston Brass, daughter Samson Frederic and son Francesco Sor Edu: [employer] Occ: Radiation therapist at [employer]  Diet: good water,  fruits/vegetables daily  Activity: walking outside during lunch      Relevant past medical, surgical, family and social history reviewed and updated as indicated. Interim medical history since our last visit reviewed. Allergies and medications reviewed and updated. Outpatient Medications Prior to Visit  Medication Sig Dispense Refill   adalimumab (HUMIRA, 2 PEN,) 40 MG/0.4ML pen Inject 40 mg subcutaneously every 14 (fourteen) days 2 each 5   ALPRAZolam (XANAX) 0.5 MG tablet Take 1 tablet (0.5 mg total) by mouth 3 (three) times daily as needed. 90 tablet 2   buPROPion (WELLBUTRIN XL) 150 MG 24 hr tablet Take 1 tablet (150 mg total) by mouth daily after breakfast. 90 tablet 3   cyclobenzaprine (FLEXERIL) 5 MG tablet Take 1-2 tablets (5-10 mg total) by mouth 2 (two) times daily as needed (migraine headache - sedation precautions). 30 tablet 1   drospirenone-ethinyl estradiol (JASMIEL) 3-0.02 MG tablet Take 1 tablet by mouth daily. Continuous active pills only 112 tablet 1   drospirenone-ethinyl estradiol (JASMIEL) 3-0.02 MG tablet Take 1 tablet by mouth daily, continuous active pills. 84 tablet 3   lisdexamfetamine (VYVANSE) 60 MG capsule Take 1 capsule (60 mg total) by mouth every morning. 90 capsule 0   amLODipine (NORVASC) 5 MG tablet Take 1 tablet (5 mg total) by mouth daily. 90 tablet 4   pantoprazole (PROTONIX) 40 MG tablet Take 1 tablet by mouth daily 90 tablet 4   Rimegepant Sulfate (NURTEC)  75 MG TBDP Take 1 tablet (75 mg total) by mouth daily as needed. For migraines. Take as close to onset of migraine as possible. One daily maximum. 8 tablet 6   albuterol (VENTOLIN HFA) 108 (90 Base) MCG/ACT inhaler Inhale 2 puffs into the lungs every 6 (six) hours as needed for wheezing or shortness of breath. (Patient not taking: Reported on 07/03/2023) 6.7 g 0   No facility-administered medications prior to visit.     Per HPI unless specifically indicated in ROS section below Review of Systems   Constitutional:  Negative for activity change, appetite change, chills, fatigue, fever and unexpected weight change.  HENT:  Negative for hearing loss.   Eyes:  Negative for visual disturbance.  Respiratory:  Negative for cough, chest tightness, shortness of breath and wheezing.   Cardiovascular:  Negative for chest pain, palpitations and leg swelling.  Gastrointestinal:  Negative for abdominal distention, abdominal pain, blood in stool, constipation, diarrhea, nausea and vomiting.  Genitourinary:  Negative for difficulty urinating and hematuria.  Musculoskeletal:  Negative for arthralgias, myalgias and neck pain.  Skin:  Negative for rash.  Neurological:  Negative for dizziness, seizures, syncope and headaches.  Hematological:  Negative for adenopathy. Does not bruise/bleed easily.  Psychiatric/Behavioral:  Negative for dysphoric mood. The patient is not nervous/anxious.     Objective:  BP 120/80   Pulse 90   Temp 98.5 F (36.9 C) (Oral)   Ht 5' 5.25" (1.657 m)   Wt 149 lb 8 oz (67.8 kg)   SpO2 98%   BMI 24.69 kg/m   Wt Readings from Last 3 Encounters:  07/03/23 149 lb 8 oz (67.8 kg)  12/14/22 153 lb 2 oz (69.5 kg)  09/17/22 154 lb 4 oz (70 kg)      Physical Exam Vitals and nursing note reviewed.  Constitutional:      Appearance: Normal appearance. She is not ill-appearing.  HENT:     Head: Normocephalic and atraumatic.     Right Ear: Tympanic membrane, ear canal and external ear normal. There is no impacted cerumen.     Left Ear: Tympanic membrane, ear canal and external ear normal. There is no impacted cerumen.     Mouth/Throat:     Mouth: Mucous membranes are moist.     Pharynx: Oropharynx is clear. No oropharyngeal exudate or posterior oropharyngeal erythema.  Eyes:     General:        Right eye: No discharge.        Left eye: No discharge.     Extraocular Movements: Extraocular movements intact.     Conjunctiva/sclera: Conjunctivae normal.     Pupils: Pupils are  equal, round, and reactive to light.  Neck:     Thyroid: No thyroid mass or thyromegaly.  Cardiovascular:     Rate and Rhythm: Normal rate and regular rhythm.     Pulses: Normal pulses.     Heart sounds: Normal heart sounds. No murmur heard. Pulmonary:     Effort: Pulmonary effort is normal. No respiratory distress.     Breath sounds: Normal breath sounds. No wheezing, rhonchi or rales.  Abdominal:     General: Bowel sounds are normal. There is no distension.     Palpations: Abdomen is soft. There is no mass.     Tenderness: There is no abdominal tenderness. There is no guarding or rebound.     Hernia: No hernia is present.  Musculoskeletal:     Cervical back: Normal range of motion and neck  supple. No rigidity.     Right lower leg: No edema.     Left lower leg: No edema.  Lymphadenopathy:     Cervical: No cervical adenopathy.  Skin:    General: Skin is warm and dry.     Findings: No rash.  Neurological:     General: No focal deficit present.     Mental Status: She is alert. Mental status is at baseline.  Psychiatric:        Mood and Affect: Mood normal.        Behavior: Behavior normal.       Results for orders placed or performed in visit on 06/28/23  TSH   Collection Time: 06/28/23  7:24 AM  Result Value Ref Range   TSH 2.76 0.35 - 5.50 uIU/mL  Microalbumin / creatinine urine ratio   Collection Time: 06/28/23  7:24 AM  Result Value Ref Range   Microalb, Ur 1.4 0.0 - 1.9 mg/dL   Creatinine,U 161.0 mg/dL   Microalb Creat Ratio 4.4 0.0 - 30.0 mg/g  Comprehensive metabolic panel   Collection Time: 06/28/23  7:24 AM  Result Value Ref Range   Sodium 139 135 - 145 mEq/L   Potassium 4.3 3.5 - 5.1 mEq/L   Chloride 103 96 - 112 mEq/L   CO2 28 19 - 32 mEq/L   Glucose, Bld 101 (H) 70 - 99 mg/dL   BUN 11 6 - 23 mg/dL   Creatinine, Ser 9.60 0.40 - 1.20 mg/dL   Total Bilirubin 0.4 0.2 - 1.2 mg/dL   Alkaline Phosphatase 41 39 - 117 U/L   AST 8 0 - 37 U/L   ALT 4 0 - 35 U/L    Total Protein 7.5 6.0 - 8.3 g/dL   Albumin 3.9 3.5 - 5.2 g/dL   GFR 45.40 >98.11 mL/min   Calcium 9.2 8.4 - 10.5 mg/dL  Lipid panel   Collection Time: 06/28/23  7:24 AM  Result Value Ref Range   Cholesterol 191 0 - 200 mg/dL   Triglycerides 914.7 0.0 - 149.0 mg/dL   HDL 82.95 >62.13 mg/dL   VLDL 08.6 0.0 - 57.8 mg/dL   LDL Cholesterol 469 (H) 0 - 99 mg/dL   Total CHOL/HDL Ratio 3    NonHDL 130.52     Assessment & Plan:   Problem List Items Addressed This Visit     Encounter for general adult medical examination with abnormal findings - Primary (Chronic)   Preventative protocols reviewed and updated unless pt declined. Discussed healthy diet and lifestyle.       Essential hypertension   Chronic, stable period on amlodipine - continue.       Relevant Medications   amLODipine (NORVASC) 5 MG tablet   Chronic migraine without aura with status migrainosus, not intractable   Chronic, stable on PRN nurtec infrequently.       Relevant Medications   amLODipine (NORVASC) 5 MG tablet   Rimegepant Sulfate (NURTEC) 75 MG TBDP   Raynaud's syndrome   Stable period - uses hand warmers PRN      Relevant Medications   amLODipine (NORVASC) 5 MG tablet   Asthma   Continue PRN albuterol rescue inhaler, rare use      Ankylosing spondylitis (HCC)   Stable period on Humira  Appreciate [facility] rheum care Dr Allena Katz Q6 mo      GERD (gastroesophageal reflux disease)   Chronic, managed with daily PPI - continue  Remote EGD WNL      Relevant Medications  pantoprazole (PROTONIX) 40 MG tablet   Generalized anxiety disorder   Regularly sees psychiatry on xanax wellbutrin as well as ADHD treatment.       Attention deficit hyperactivity disorder, combined type   Regularly sees psychiatry on vyvanse and treatment for GAD        Meds ordered this encounter  Medications   amLODipine (NORVASC) 5 MG tablet    Sig: Take 1 tablet (5 mg total) by mouth daily.    Dispense:  90 tablet     Refill:  4   pantoprazole (PROTONIX) 40 MG tablet    Sig: Take 1 tablet by mouth daily    Dispense:  90 tablet    Refill:  4   Rimegepant Sulfate (NURTEC) 75 MG TBDP    Sig: Take 1 tablet (75 mg total) by mouth daily as needed. For migraines. Take as close to onset of migraine as possible. One daily maximum.    Dispense:  8 tablet    Refill:  6    Dispense 8 or max allowed by insurance. Patient also has a copay card    No orders of the defined types were placed in this encounter.   Patient Instructions  We will request latest mammogram through GYN.  Good to see you today  Return as needed or in 1 year for next physical   Follow up plan: Return in about 1 year (around 07/02/2024) for annual exam, prior fasting for blood work.  Eustaquio Boyden, MD

## 2023-07-03 NOTE — Assessment & Plan Note (Signed)
 Preventative protocols reviewed and updated unless pt declined. Discussed healthy diet and lifestyle.

## 2023-07-03 NOTE — Assessment & Plan Note (Signed)
 Continue PRN albuterol rescue inhaler, rare use

## 2023-07-03 NOTE — Assessment & Plan Note (Signed)
 Chronic, managed with daily PPI - continue  Remote EGD WNL

## 2023-07-03 NOTE — Assessment & Plan Note (Signed)
 Regularly sees psychiatry on vyvanse and treatment for GAD

## 2023-07-04 ENCOUNTER — Encounter: Payer: Self-pay | Admitting: Obstetrics and Gynecology

## 2023-07-05 ENCOUNTER — Other Ambulatory Visit: Payer: Self-pay

## 2023-07-15 ENCOUNTER — Other Ambulatory Visit: Payer: Self-pay

## 2023-07-15 ENCOUNTER — Other Ambulatory Visit: Payer: Self-pay | Admitting: Pharmacy Technician

## 2023-07-15 NOTE — Progress Notes (Signed)
 Specialty Pharmacy Refill Coordination Note  Caitlin Burns is a 43 y.o. female contacted today regarding refills of specialty medication(s) Adalimumab (Humira (2 Pen))   Patient requested Delivery   Delivery date: 07/16/23   Verified address: [address]  [place]   Medication will be filled on 07/15/23.

## 2023-07-22 ENCOUNTER — Other Ambulatory Visit: Payer: Self-pay

## 2023-07-23 ENCOUNTER — Other Ambulatory Visit: Payer: Self-pay

## 2023-07-29 ENCOUNTER — Other Ambulatory Visit: Payer: Self-pay

## 2023-08-07 ENCOUNTER — Other Ambulatory Visit: Payer: Self-pay

## 2023-08-12 ENCOUNTER — Other Ambulatory Visit (HOSPITAL_COMMUNITY): Payer: Self-pay

## 2023-08-14 ENCOUNTER — Encounter: Payer: Self-pay | Admitting: Family Medicine

## 2023-08-14 ENCOUNTER — Ambulatory Visit: Payer: Self-pay

## 2023-08-14 ENCOUNTER — Other Ambulatory Visit: Payer: Self-pay | Admitting: Pharmacy Technician

## 2023-08-14 ENCOUNTER — Other Ambulatory Visit: Payer: Self-pay

## 2023-08-14 NOTE — Progress Notes (Signed)
 Specialty Pharmacy Refill Coordination Note  Caitlin Burns is a 43 y.o. female contacted today regarding refills of specialty medication(s) Adalimumab (Humira (2 Pen))   Patient requested Delivery   Delivery date: 08/15/23   Verified address: [address]  [place]   Medication will be filled on 08/14/23.

## 2023-08-14 NOTE — Telephone Encounter (Signed)
 Chief Complaint: chest pain Symptoms: chest pain, fast HR, HTN Frequency: since last night Pertinent Negatives: Patient denies SOB, N/V, diaphoresis Disposition: [x] ED /[] Urgent Care (no appt availability in office) / [] Appointment(In office/virtual)/ []  [facility]/ [] Home Care/ [] Refused Recommended Disposition /[] [facility]/ []  Follow-up with PCP Additional Notes: Pt reports 3/10 mid-sternal chest pain since last night. 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 Pt took her amlodipine this AM but endorses high BP. Pt had a nurse at work take her BP and states it was 137/110 and then 130/100. Pt also states her HR has been elevated. Pt states according to her watch her HR has been from 110-130 today and never below 100. Pt has a hx of HTN, SVT, & incomplete bundle branch block. RN advised pt she needs to go to the ED. Pt states her son is getting dropped off at the hospital while her mother takes her daughter to an appt. Pt states she is going to check her BP again if it is worse or she is not feeling better, she will have her mother take her son. RN advised pt it's important she go to the ED. Pt verbalized understanding.      Copied from CRM [id number]. Topic: Clinical - Red Word Triage >> Aug 14, 2023  2:54 PM Bruna Potter N wrote: [facility] that prompted transfer to Nurse Triage: ot having chest pain, weak pulse and high bp Reason for Disposition  [1] Chest pain lasts > 5 minutes AND [2] age > 30 AND [3] one or more cardiac risk factors (e.g., diabetes, high blood pressure, high cholesterol, smoker, or strong family history of heart disease)  Answer Assessment - Initial Assessment Questions 1. LOCATION: "Where does it hurt?"       "In the middle" 2. RADIATION: "Does the pain go anywhere else?" (e.g., into neck, jaw, arms, back)     No 3. ONSET: "When did the chest pain begin?" (Minutes, hours or days)      "It started a little bit  yesterday evening and I have a little bit of a cough and I thought that's weird but I put a cough drop in and I haven't coughed at all today but CP is still there" 4. PATTERN: "Does the pain come and go, or has it been constant since it started?"  "Does it get worse with exertion?"      Constant 5. DURATION: "How long does it last" (e.g., seconds, minutes, hours)     Constant  6. SEVERITY: "How bad is the pain?"  (e.g., Scale 1-10; mild, moderate, or severe)    - MILD (1-3): doesn't interfere with normal activities     - MODERATE (4-7): interferes with normal activities or awakens from sleep    - SEVERE (8-10): excruciating pain, unable to do any normal activities       "It's not that bad, it's just there, like a 3/10" 7. CARDIAC RISK FACTORS: "Do you have any history of heart problems or risk factors for heart disease?" (e.g., angina, prior heart attack; diabetes, high blood pressure, high cholesterol, smoker, or strong family history of heart disease)     HTN, SVT, incomplete R bundle branch block 8. PULMONARY RISK FACTORS: "Do you have any history of lung disease?"  (e.g., blood clots in lung, asthma, emphysema, birth control pills)     Asthma 9. CAUSE: "What do you think is causing the chest pain?"  Not sure  10. OTHER SYMPTOMS: "Do you have any other symptoms?" (e.g., dizziness, nausea, vomiting, sweating, fever, difficulty breathing, cough)       BP high today even though she took her meds. Nurse at work took it, 137/110. An hour later it was 130/100. Pt states her pulse has been 110-130. States she had heart palpitations earlier today. Hx of HTN and SVT.  Protocols used: Chest Pain-A-AH

## 2023-08-14 NOTE — Telephone Encounter (Signed)
 Lvm asking pt to call back. Need to get answer to Dr Timoteo Expose question, relay message and schedule OV as recommended.

## 2023-08-14 NOTE — Telephone Encounter (Addendum)
 Any palpitations with this?  She has h/o spondyloarthropathy - pain could be exacerbation of this.  Would suggest she try flexeril and/or topical voltaren.  Would offer OV for evaluation with next available provider.

## 2023-08-15 ENCOUNTER — Ambulatory Visit: Admitting: General Practice

## 2023-08-15 ENCOUNTER — Encounter: Payer: Self-pay | Admitting: General Practice

## 2023-08-15 ENCOUNTER — Ambulatory Visit (INDEPENDENT_AMBULATORY_CARE_PROVIDER_SITE_OTHER)
Admission: RE | Admit: 2023-08-15 | Discharge: 2023-08-15 | Disposition: A | Discharge status: Home / Self Care | Source: Ambulatory Visit | Attending: General Practice | Admitting: General Practice

## 2023-08-15 ENCOUNTER — Telehealth: Admitting: Physician Assistant

## 2023-08-15 ENCOUNTER — Other Ambulatory Visit: Payer: Self-pay

## 2023-08-15 VITALS — BP 136/82 | HR 106 | Temp 99.3°F | Ht 65.5 in | Wt 150.0 lb

## 2023-08-15 DIAGNOSIS — R6889 Other general symptoms and signs: Secondary | ICD-10-CM | POA: Diagnosis not present

## 2023-08-15 DIAGNOSIS — R079 Chest pain, unspecified: Secondary | ICD-10-CM | POA: Diagnosis not present

## 2023-08-15 DIAGNOSIS — R058 Other specified cough: Secondary | ICD-10-CM

## 2023-08-15 DIAGNOSIS — R002 Palpitations: Secondary | ICD-10-CM | POA: Diagnosis not present

## 2023-08-15 DIAGNOSIS — F411 Generalized anxiety disorder: Secondary | ICD-10-CM | POA: Diagnosis not present

## 2023-08-15 DIAGNOSIS — R509 Fever, unspecified: Secondary | ICD-10-CM | POA: Diagnosis not present

## 2023-08-15 DIAGNOSIS — R0789 Other chest pain: Secondary | ICD-10-CM | POA: Insufficient documentation

## 2023-08-15 DIAGNOSIS — R059 Cough, unspecified: Secondary | ICD-10-CM | POA: Diagnosis not present

## 2023-08-15 LAB — COMPREHENSIVE METABOLIC PANEL WITH GFR
ALT: 6 U/L (ref 0–35)
AST: 10 U/L (ref 0–37)
Albumin: 4.4 g/dL (ref 3.5–5.2)
Alkaline Phosphatase: 48 U/L (ref 39–117)
BUN: 10 mg/dL (ref 6–23)
CO2: 28 meq/L (ref 19–32)
Calcium: 9.3 mg/dL (ref 8.4–10.5)
Chloride: 100 meq/L (ref 96–112)
Creatinine, Ser: 0.75 mg/dL (ref 0.40–1.20)
GFR: 98.13 mL/min (ref >60.00)
Glucose, Bld: 120 mg/dL — ABNORMAL HIGH (ref 70–99)
Potassium: 3.9 meq/L (ref 3.5–5.1)
Sodium: 135 meq/L (ref 135–145)
Total Bilirubin: 0.3 mg/dL (ref 0.2–1.2)
Total Protein: 8 g/dL (ref 6.0–8.3)

## 2023-08-15 LAB — CBC
HCT: 38.1 % (ref 36.0–46.0)
Hemoglobin: 13.3 g/dL (ref 12.0–15.0)
MCHC: 34.9 g/dL (ref 30.0–36.0)
MCV: 85.8 fl (ref 78.0–100.0)
Platelets: 293 10*3/uL (ref 150.0–400.0)
RBC: 4.43 Mil/uL (ref 3.87–5.11)
RDW: 12.5 % (ref 11.5–15.5)
WBC: 5.5 10*3/uL (ref 4.0–10.5)

## 2023-08-15 LAB — TROPONIN I (HIGH SENSITIVITY): High Sens Troponin I: <2 ng/L (ref 2–17)

## 2023-08-15 LAB — TSH: TSH: 1.33 u[IU]/mL (ref 0.35–5.50)

## 2023-08-15 MED ORDER — METOPROLOL SUCCINATE ER 25 MG PO TB24
12.5000 mg | ORAL_TABLET | Freq: Every day | ORAL | 1 refills | Status: DC
  Filled 2023-08-15: qty 30, 60d supply, fill #0

## 2023-08-15 MED ORDER — OSELTAMIVIR PHOSPHATE 75 MG PO CAPS: 75.0000 mg | ORAL_CAPSULE | Freq: Two times a day (BID) | ORAL | 0 refills | Status: DC

## 2023-08-15 NOTE — Telephone Encounter (Signed)
 Spoke with patient about her results and message below. She is going to get otc covid and flu tests to take and will let us know the results.

## 2023-08-15 NOTE — Assessment & Plan Note (Signed)
 Differentials include pneumonia, viral URI.   Given her symptoms with tachycardia, cough and low grade fever, STAT chest x-ray ordered.   Await results

## 2023-08-15 NOTE — Progress Notes (Signed)
 I have spent 5 minutes in review of e-visit questionnaire, review and updating patient chart, medical decision making and response to patient.   Piedad Climes, PA-C

## 2023-08-15 NOTE — Progress Notes (Signed)
 E visit for Flu like symptoms   We are sorry that you are not feeling well.  Here is how we plan to help! Based on what you have shared with me it looks like you may have a respiratory virus that may be influenza.  Influenza or "the flu" is   an infection caused by a respiratory virus. The flu virus is highly contagious and persons who did not receive their yearly flu vaccination may "catch" the flu from close contact.  We have anti-viral medications to treat the viruses that cause this infection. They are not a "cure" and only shorten the course of the infection. These prescriptions are most effective when they are given within the first 2 days of "flu" symptoms. Antiviral medication are indicated if you have a high risk of complications from the flu. You should  also consider an antiviral medication if you are in close contact with someone who is at risk. These medications can help patients avoid complications from the flu  but have side effects that you should know. Possible side effects from Tamiflu or oseltamivir include nausea, vomiting, diarrhea, dizziness, headaches, eye redness, sleep problems or other respiratory symptoms. You should not take Tamiflu if you have an allergy to oseltamivir or any to the ingredients in Tamiflu.  Based upon your symptoms and potential risk factors I have prescribed Oseltamivir (Tamiflu).  It has been sent to your designated pharmacy.  You will take one 75 mg capsule orally twice a day for the next 5 days. and I recommend that you follow the flu symptoms recommendation that I have listed below.  Please keep well-hydrated and try to get plenty of rest. If you have a humidifier, place it in the bedroom and run it at night. Start a saline nasal rinse for nasal congestion. You can consider use of a nasal steroid spray like Flonase or Nasacort OTC. You can use Tylenol if needed for fever, body aches, headache and/or throat pain. Salt water-gargles and chloraseptic  spray can be very beneficial for sore throat. Mucinex-DM for congestion or cough. Please take all prescribed medications as directed.  Remain out of work until [employer] for 24 hours without a fever-reducing medication, and you are feeling better.  You should mask until symptoms are resolved.  If anything worsens despite treatment, you need to be evaluated in-person. Please do not delay care.   ANYONE WHO HAS FLU SYMPTOMS SHOULD: Stay home. The flu is highly contagious and going out or to work exposes others! Be sure to drink plenty of fluids. Water is fine as well as fruit juices, sodas and electrolyte beverages. You may want to stay away from caffeine or alcohol. If you are nauseated, try taking small sips of liquids. How do you know if you are getting enough fluid? Your urine should be a pale yellow or almost colorless. Get rest. Taking a steamy shower or using a humidifier may help nasal congestion and ease sore throat pain. Using a saline nasal spray works much the same way. Cough drops, hard candies and sore throat lozenges may ease your cough. Line up a caregiver. Have someone check on you regularly.   GET HELP RIGHT AWAY IF: You cannot keep down liquids or your medications. You become short of breath Your fell like you are going to pass out or loose consciousness. Your symptoms persist after you have completed your treatment plan MAKE SURE YOU  Understand these instructions. Will watch your condition. Will get help right away if you  are not doing well or get worse.  Your e-visit answers were reviewed by a board certified advanced clinical practitioner to complete your personal care plan.  Depending on the condition, your plan could have included both over the counter or prescription medications.  If there is a problem please reply  once you have received a response from your provider.  Your safety is important to [place].  If you have drug allergies check your prescription  carefully.    You can use MyChart to ask questions about today's visit, request a non-urgent call back, or ask for a work or school excuse for 24 hours related to this e-Visit. If it has been greater than 24 hours you will need to follow up with your provider, or enter a new e-Visit to address those concerns.  You will get an e-mail in the next two days asking about your experience.  I hope that your e-visit has been valuable and will speed your recovery. Thank you for using e-visits.

## 2023-08-15 NOTE — Assessment & Plan Note (Signed)
 Unclear etiology.   EKG tracing is personally reviewed.  EKG notes sinus tachycardia, incomplete right bundle branch block, left atrial enlargement.  No acute changes. Compared to previous EKG in 2023, today's EKG is similar.   Differentials include anxiety, pneumonia, costochondritis.   Discussed at length with patient.  Advised patient to start metoprolol 12.5 mg once daily.  Cbc, CMP, troponin (STAT) and TSH pending.  Strict ER precautions given.

## 2023-08-15 NOTE — Progress Notes (Signed)
 Established Patient Office Visit  Subjective   Patient ID: Caitlin Burns, female    DOB: 25-Mar-1981  Age: 43 y.o. MRN: [id number]  Chief Complaint  Patient presents with   Chest Pain    Patient has been dealing with a lot of stress at home with her husband and bills. Patient was sent in metoprolol this morning but has not picked up yet. Chest pain started yesterday.     Chest Pain  Associated symptoms include palpitations. Pertinent negatives include no abdominal pain, dizziness, fever, headaches, nausea, shortness of breath or vomiting.    Caitlin Burns is a 43 year old female with past medical history of GAD, ADHD, SVT, HTN, GERD, Ankylosing spondylitis  presents today for an acute visit to discuss chest pain.   Symptom onset was yesterday with chest pain. She was triaged by the [facility] and was advised to go to the ER but patient declined at the time. She sent a message to Dr. Sharen Hones regarding her symptoms and her increased stress levels at home. She also reported that she has been having palpitations. He sent in a prescription for Metoprolol this morning but she has not picked it up yet.   Today she reports, that the chest pain is still there. Things are slightly better with her husband but still having issues. Her pain is located on mid sternum and on the left side. Patient is not radiating. Yesterday, pain was more of a pressure and today she feels chest tightness.   She also reports that on friday night, she was trying to help her son and put three tic tacs and she said that she put three in her mouth and choked on one of them. She said that she couldn't breathe until it went down. She is concerned that she aspirated on it. Since then, she also developed at cough lying down. She has not had checked her temperature at home. No nasal congestion. She feels she has some hoarseness. She is having a productive cough but has not been able to cough it up so she swallows it and reports it as  a metallic taste. She is having to work harder to get a deep breath but no shortness of breath.   She has a history of anxiety for which she takes Wellbutrin daily and xanax as needed. She is followed by psychiatry.    Patient Active Problem List   Diagnosis Date Noted   Palpitations 08/15/2023   Chest pain 08/15/2023   Productive cough 08/15/2023   Abnormal echocardiogram 06/28/2022   Incomplete right bundle branch block 09/09/2020   Motion sickness 11/08/2019   Generalized anxiety disorder 05/18/2018   Attention deficit hyperactivity disorder, combined type 05/18/2018   Episodic lightheadedness 06/10/2014   GERD (gastroesophageal reflux disease) 01/17/2012   Ankylosing spondylitis (HCC) 10/30/2011   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   Encounter for general adult medical examination with abnormal findings 10/06/2010   Asthma 10/06/2010   Essential hypertension 03/24/2010   SVT (supraventricular tachycardia) (HCC) 03/24/2010   Raynaud's syndrome 03/23/2010   Chronic migraine without aura with status migrainosus, not intractable 07/14/2007   Past Medical History:  Diagnosis Date   Allergy July 1986   Anxiety 10/06/2010   Asthma 10/06/2010   ATTENTION DEFICIT HYPERACTIVITY DISORDER, 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   DEPRESSION 07/14/2007   Diabetes mellitus without complication (HCC) June 2014   gestational   Essential hypertension, benign 03/24/2010   GERD (gastroesophageal reflux disease)    Helicobacter pylori ab+ 06/2021  treated for H pylori due to IgG +. 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   SVD (spontaneous vaginal delivery) 01/27/2013   Past Surgical History:  Procedure Laterality Date   REFRACTIVE SURGERY  2004   Lasik   WISDOM TOOTH EXTRACTION     Allergies  Allergen Reactions   Advil [Ibuprofen] Other (See Comments)    Elevated blood pressures   Latex Hives   Aspirin  Hives and Rash   Sulfonamide Derivatives Hives and Rash         08/15/2023   12:33 PM 07/03/2023    3:31 PM 06/27/2022    3:35 PM  Depression screen PHQ 2/9  Decreased Interest 0 0 0  Down, Depressed, Hopeless 0 0 0  PHQ - 2 Score 0 0 0  Altered sleeping 0    Tired, decreased energy 1    Change in appetite 0    Feeling bad or failure about yourself  0    Trouble concentrating 0    Moving slowly or fidgety/restless 0    Suicidal thoughts 0    PHQ-9 Score 1    Difficult doing work/chores Not difficult at all  Not difficult at all       08/15/2023   12:33 PM 07/03/2023    3:31 PM 06/27/2022    3:35 PM 01/06/2021    3:05 PM  GAD 7 : Generalized Anxiety Score  Nervous, Anxious, on Edge 3 0 0 0  Control/stop worrying 2 0 0 0  Worry too much - different things 2 0 0 0  Trouble relaxing 0 0 0 0  Restless 0 0 0 0  Easily annoyed or irritable 3 0 0 0  Afraid - awful might happen 1 0 0 0  Total GAD 7 Score 11 0 0 0  Anxiety Difficulty Not difficult at all  Not difficult at all       Review of Systems  Constitutional:  Negative for chills and fever.  Respiratory:  Negative for shortness of breath.   Cardiovascular:  Positive for chest pain and palpitations.  Gastrointestinal:  Negative for abdominal pain, constipation, diarrhea, heartburn, nausea and vomiting.  Genitourinary:  Negative for dysuria, frequency and urgency.  Neurological:  Negative for dizziness and headaches.  Endo/Heme/Allergies:  Negative for polydipsia.  Psychiatric/Behavioral:  Negative for depression and suicidal ideas. The patient is not nervous/anxious.       Objective:     BP 136/82 (BP Location: Left Arm, Patient Position: Sitting, Cuff Size: Normal)   Pulse (!) 106   Temp 99.3 F (37.4 C) (Oral)   Ht 5' 5.5" (1.664 m)   Wt 150 lb (68 kg)   SpO2 99%   BMI 24.58 kg/m  BP Readings from Last 3 Encounters:  08/15/23 136/82  07/03/23 120/80  12/14/22 120/80   Wt Readings from Last 3 Encounters:   08/15/23 150 lb (68 kg)  07/03/23 149 lb 8 oz (67.8 kg)  12/14/22 153 lb 2 oz (69.5 kg)      Physical Exam Vitals and nursing note reviewed.  Constitutional:      Appearance: Normal appearance.  HENT:     Right Ear: Tympanic membrane, ear canal and external ear normal.     Left Ear: Tympanic membrane, ear canal and external ear normal.  Cardiovascular:     Rate and Rhythm: Regular rhythm. Tachycardia present.     Pulses: Normal pulses.  Heart sounds: Normal heart sounds. No murmur heard. Pulmonary:     Effort: Pulmonary effort is normal.     Breath sounds: Normal breath sounds.  Skin:    General: Skin is warm.  Neurological:     Mental Status: She is alert and oriented to person, place, and time.  Psychiatric:        Mood and Affect: Mood normal.        Behavior: Behavior normal.        Thought Content: Thought content normal.        Judgment: Judgment normal.      No results found for any visits on 08/15/23.     The 10-year ASCVD risk score (Arnett DK, et al., 2019) is: 0.8%    Assessment & Plan:  Chest pain, unspecified type Assessment & Plan: Unclear etiology.   EKG tracing is personally reviewed.  EKG notes sinus tachycardia, incomplete right bundle branch block, left atrial enlargement.  No acute changes. Compared to previous EKG in 2023, today's EKG is similar.   Differentials include anxiety, pneumonia, costochondritis.   Discussed at length with patient.  Advised patient to start metoprolol 12.5 mg once daily.  Cbc, CMP, troponin (STAT) and TSH pending.  Strict ER precautions given.   Orders: -     EKG 12-Lead -     CBC -     Comprehensive metabolic panel with GFR -     TSH -     Troponin I (High Sensitivity) -     DG Chest 2 View  Palpitations Assessment & Plan: Suspect this could be secondary to her anxiety.   Start metoprolol 12.5 mg once daily.   CBC, CMP, TSH pending.   Orders: -     EKG 12-Lead -     CBC -     Comprehensive  metabolic panel with GFR -     TSH -     Troponin I (High Sensitivity)  Productive cough Assessment & Plan: Differentials include pneumonia, viral URI.   Given her symptoms with tachycardia, cough and low grade fever, STAT chest x-ray ordered.   Await results  Orders: -     DG Chest 2 View  Generalized anxiety disorder Assessment & Plan: Suspect her symptoms could be secondary to situational anxiety.   Advised patient to call psychiatry to schedule therapy appointment.   Continue Wellbutrin, Xanax as needed.      Return if symptoms worsen or fail to improve.    Modesto Charon, NP

## 2023-08-15 NOTE — Assessment & Plan Note (Signed)
 Suspect this could be secondary to her anxiety.   Start metoprolol 12.5 mg once daily.   CBC, CMP, TSH pending.

## 2023-08-15 NOTE — Assessment & Plan Note (Signed)
 Suspect her symptoms could be secondary to situational anxiety.   Advised patient to call psychiatry to schedule therapy appointment.   Continue Wellbutrin, Xanax as needed.

## 2023-08-15 NOTE — Patient Instructions (Addendum)
 Stop by the lab prior to leaving today. I will notify you of your results once received.  Complete xray(s) prior to leaving today. I will notify you of your results once received.  Start Metoprolol 12.5 mg once daily for your heart rate and BP.   Continue Voltaren and Flexeril as needed for pain.  Follow up with PCP if symptoms worsen or do not improve.  It was a pleasure meeting you!

## 2023-08-15 NOTE — Telephone Encounter (Signed)
 Noted thanks. Appreciate Bableen seeing patient.

## 2023-08-15 NOTE — Telephone Encounter (Signed)
 Noted.

## 2023-08-15 NOTE — Telephone Encounter (Signed)
 Spoke with pt asking if having palpitations. States she does off and on. I relayed Dr Timoteo Expose message. Pt verbalizes understanding and scheduled OV today with Bableen at 12:20.   Pt also mentioned she sent Dr Reece Agar a MyChart message yesterday.

## 2023-08-29 ENCOUNTER — Other Ambulatory Visit: Payer: Self-pay | Admitting: Family Medicine

## 2023-08-29 ENCOUNTER — Other Ambulatory Visit: Payer: Self-pay

## 2023-08-29 DIAGNOSIS — J4520 Mild intermittent asthma, uncomplicated: Secondary | ICD-10-CM

## 2023-08-29 NOTE — Telephone Encounter (Signed)
 Albuterol  inh Last filled:  06/06/22, #6.7 g Last OV:  07/03/23, CPE Next OV:  07/06/24, CPE

## 2023-08-30 ENCOUNTER — Other Ambulatory Visit: Payer: Self-pay

## 2023-08-30 MED ORDER — ALBUTEROL SULFATE HFA 108 (90 BASE) MCG/ACT IN AERS
2.0000 | INHALATION_SPRAY | Freq: Four times a day (QID) | RESPIRATORY_TRACT | 1 refills | Status: AC | PRN
  Filled 2023-08-30: qty 6.7, 25d supply, fill #0

## 2023-08-30 NOTE — Telephone Encounter (Signed)
 ERx

## 2023-09-02 DIAGNOSIS — M458 Ankylosing spondylitis sacral and sacrococcygeal region: Secondary | ICD-10-CM | POA: Diagnosis not present

## 2023-09-02 DIAGNOSIS — I7300 Raynaud's syndrome without gangrene: Secondary | ICD-10-CM | POA: Diagnosis not present

## 2023-09-02 DIAGNOSIS — Z7960 Long term (current) use of unspecified immunomodulators and immunosuppressants: Secondary | ICD-10-CM | POA: Diagnosis not present

## 2023-09-03 ENCOUNTER — Other Ambulatory Visit: Payer: Self-pay

## 2023-09-03 ENCOUNTER — Encounter (HOSPITAL_COMMUNITY): Payer: Self-pay

## 2023-09-03 NOTE — Progress Notes (Signed)
 Specialty Pharmacy Refill Coordination Note  Caitlin Burns is a 44 y.o. female contacted today regarding refills of specialty medication(s) Adalimumab  (Humira  (2 Pen))   Patient requested Delivery   Delivery date: 09/12/23   Verified address: [address]  [name]   Medication will be filled on 09/11/23.

## 2023-09-03 NOTE — Progress Notes (Signed)
 Specialty Pharmacy Ongoing Clinical Assessment Note  Caitlin Burns is a 43 y.o. female who is being followed by the specialty pharmacy service for RxSp Ankylosing Spondylitis   Patient's specialty medication(s) reviewed today: Adalimumab  (Humira  (2 Pen))   Missed doses in the last 4 weeks: 0   Patient/Caregiver did not have any additional questions or concerns.   Therapeutic benefit summary: Patient is achieving benefit   Adverse events/side effects summary: No adverse events/side effects   Patient's therapy is appropriate to: Continue    Goals Addressed             This Visit's Progress    Minimize recurrence of flares   On track    Patient is on track. Patient will maintain adherence         Follow up:  6 months  Caitlin Burns Specialty Pharmacist

## 2023-09-11 ENCOUNTER — Other Ambulatory Visit: Payer: Self-pay

## 2023-09-16 ENCOUNTER — Other Ambulatory Visit: Payer: Self-pay

## 2023-09-30 ENCOUNTER — Other Ambulatory Visit: Payer: Self-pay

## 2023-10-02 ENCOUNTER — Other Ambulatory Visit (HOSPITAL_COMMUNITY): Payer: Self-pay

## 2023-10-02 ENCOUNTER — Other Ambulatory Visit: Payer: Self-pay

## 2023-10-02 NOTE — Progress Notes (Signed)
 Specialty Pharmacy Refill Coordination Note  Caitlin Burns is a 43 y.o. female contacted today regarding refills of specialty medication(s) Humira .  Patient requested (Patient-Rptd) Delivery   Delivery date: (Patient-Rptd) 10/10/23   Verified address: (Patient-Rptd) [address] [place], [place] [address]   Medication will be filled on 10/09/23.

## 2023-10-20 ENCOUNTER — Other Ambulatory Visit: Payer: Self-pay | Admitting: Adult Health

## 2023-10-20 DIAGNOSIS — F411 Generalized anxiety disorder: Secondary | ICD-10-CM

## 2023-10-20 DIAGNOSIS — F902 Attention-deficit hyperactivity disorder, combined type: Secondary | ICD-10-CM

## 2023-10-20 NOTE — Telephone Encounter (Signed)
 Aaron Aas

## 2023-10-21 ENCOUNTER — Other Ambulatory Visit: Payer: Self-pay

## 2023-10-21 MED ORDER — LISDEXAMFETAMINE DIMESYLATE 60 MG PO CAPS
60.0000 mg | ORAL_CAPSULE | ORAL | 0 refills | Status: DC
  Filled 2023-10-21: qty 90, 90d supply, fill #0

## 2023-11-04 ENCOUNTER — Other Ambulatory Visit (HOSPITAL_COMMUNITY): Payer: Self-pay

## 2023-11-05 ENCOUNTER — Encounter (INDEPENDENT_AMBULATORY_CARE_PROVIDER_SITE_OTHER): Payer: Self-pay

## 2023-11-05 ENCOUNTER — Other Ambulatory Visit: Payer: Self-pay | Admitting: Adult Health

## 2023-11-05 DIAGNOSIS — F410 Panic disorder [episodic paroxysmal anxiety] without agoraphobia: Secondary | ICD-10-CM

## 2023-11-05 DIAGNOSIS — F411 Generalized anxiety disorder: Secondary | ICD-10-CM

## 2023-11-05 NOTE — Telephone Encounter (Signed)
 Sent MyChart note to schedule FU.

## 2023-11-06 ENCOUNTER — Other Ambulatory Visit: Payer: Self-pay

## 2023-11-06 MED ORDER — ALPRAZOLAM 0.5 MG PO TABS
0.5000 mg | ORAL_TABLET | Freq: Three times a day (TID) | ORAL | 0 refills | Status: DC | PRN
  Filled 2023-11-06: qty 90, 30d supply, fill #0

## 2023-11-06 NOTE — Progress Notes (Signed)
 Specialty Pharmacy Refill Coordination Note  Caitlin Burns is a 43 y.o. female contacted today regarding refills of specialty medication(s) Adalimumab  (Humira  (2 Pen))   Patient requested (Patient-Rptd) Delivery   Delivery date: 11/07/23   Verified address: (Patient-Rptd) [address] [place]  [address]   Medication will be filled on 11/06/23.

## 2023-11-12 ENCOUNTER — Telehealth: Admitting: Family Medicine

## 2023-11-12 DIAGNOSIS — L600 Ingrowing nail: Secondary | ICD-10-CM

## 2023-11-12 NOTE — Progress Notes (Signed)
 It looks like you have an ingrown toe nail and need to be seen in person.      NOTE: There will be NO CHARGE for this E-Visit   If you are having a true medical emergency, please call 911.

## 2023-11-15 ENCOUNTER — Telehealth: Admitting: Adult Health

## 2023-11-15 ENCOUNTER — Encounter: Payer: Self-pay | Admitting: Adult Health

## 2023-11-15 ENCOUNTER — Other Ambulatory Visit: Payer: Self-pay

## 2023-11-15 DIAGNOSIS — F411 Generalized anxiety disorder: Secondary | ICD-10-CM

## 2023-11-15 DIAGNOSIS — F410 Panic disorder [episodic paroxysmal anxiety] without agoraphobia: Secondary | ICD-10-CM | POA: Diagnosis not present

## 2023-11-15 DIAGNOSIS — F909 Attention-deficit hyperactivity disorder, unspecified type: Secondary | ICD-10-CM | POA: Diagnosis not present

## 2023-11-15 DIAGNOSIS — F902 Attention-deficit hyperactivity disorder, combined type: Secondary | ICD-10-CM

## 2023-11-15 MED ORDER — BUPROPION HCL ER (XL) 150 MG PO TB24
150.0000 mg | ORAL_TABLET | Freq: Every day | ORAL | 3 refills | Status: AC
  Filled 2023-11-15: qty 30, 30d supply, fill #0
  Filled 2023-11-15: qty 60, 60d supply, fill #0
  Filled 2024-02-20: qty 90, 90d supply, fill #1
  Filled 2024-06-11: qty 90, 90d supply, fill #2

## 2023-11-15 MED ORDER — ALPRAZOLAM 0.5 MG PO TABS
0.5000 mg | ORAL_TABLET | Freq: Three times a day (TID) | ORAL | 2 refills | Status: DC | PRN
  Filled 2023-11-15 – 2023-12-10 (×2): qty 90, 30d supply, fill #0
  Filled 2024-01-19: qty 90, 30d supply, fill #1
  Filled 2024-02-23: qty 90, 30d supply, fill #2

## 2023-11-15 MED ORDER — LISDEXAMFETAMINE DIMESYLATE 60 MG PO CAPS
60.0000 mg | ORAL_CAPSULE | ORAL | 0 refills | Status: DC
  Filled 2023-11-15 – 2024-01-19 (×2): qty 90, 90d supply, fill #0
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
## 2023-11-25 ENCOUNTER — Other Ambulatory Visit: Payer: Self-pay

## 2023-11-28 ENCOUNTER — Other Ambulatory Visit: Payer: Self-pay

## 2023-11-28 ENCOUNTER — Other Ambulatory Visit: Payer: Self-pay | Admitting: Internal Medicine

## 2023-12-03 ENCOUNTER — Other Ambulatory Visit (HOSPITAL_COMMUNITY): Payer: Self-pay

## 2023-12-05 ENCOUNTER — Other Ambulatory Visit (HOSPITAL_COMMUNITY): Payer: Self-pay

## 2023-12-05 ENCOUNTER — Ambulatory Visit: Attending: Family Medicine | Admitting: Pharmacist

## 2023-12-05 ENCOUNTER — Encounter: Payer: Self-pay | Admitting: Pharmacist

## 2023-12-05 ENCOUNTER — Other Ambulatory Visit: Payer: Self-pay

## 2023-12-05 DIAGNOSIS — Z79899 Other long term (current) drug therapy: Secondary | ICD-10-CM

## 2023-12-05 MED ORDER — HUMIRA (2 PEN) 40 MG/0.4ML ~~LOC~~ AJKT
AUTO-INJECTOR | SUBCUTANEOUS | 1 refills | Status: DC
  Filled 2023-12-05: qty 2, 28d supply, fill #0
  Filled 2023-12-30: qty 2, 28d supply, fill #1
  Filled 2024-01-29: qty 2, 28d supply, fill #2
  Filled 2024-02-24: qty 2, 28d supply, fill #3

## 2023-12-05 MED ORDER — HUMIRA (2 PEN) 40 MG/0.4ML ~~LOC~~ AJKT: AUTO-INJECTOR | SUBCUTANEOUS | 1 refills | Status: DC

## 2023-12-05 NOTE — Progress Notes (Signed)
 Specialty Pharmacy Refill Coordination Note  Caitlin Burns is a 43 y.o. female contacted today regarding refills of specialty medication(s) Adalimumab  (Humira  (2 Pen))   Patient requested Delivery   Delivery date: 12/10/23   Verified address: [address] [place] [address]   Medication will be filled on 08.05.25.   This fill date is pending response to refill request from provider. Patient is aware and if they have not received fill by intended date they must follow up with pharmacy.

## 2023-12-05 NOTE — Progress Notes (Signed)
  S: Patient presents for review of their specialty medication therapy.  Patient is Humira  for ankylosing spondylitis. Patient is managed by Dr. Tobie for this.   Adherence: confirms  Efficacy: doing well, controlling her pain  Dosing:  Ankylosing spondylitis: SubQ: 40 mg every other week (may continue methotrexate, other nonbiologic DMARDS, corticosteroids, NSAIDs and/or analgesics)  Drug-drug interactions: none identified  Screening: TB test: negative  Hepatitis: negative  Monitoring: S/sx of infection: none CBC: wnl/stable 08/15/2023 S/sx of hypersensitivity: none S/sx of malignancy: none S/sx of heart failure: none  Other side effects: none  O:     Lab Results  Component Value Date   WBC 5.5 08/15/2023   HGB 13.3 08/15/2023   HCT 38.1 08/15/2023   MCV 85.8 08/15/2023   PLT 293.0 08/15/2023      Chemistry      Component Value Date/Time   NA 135 08/15/2023 1258   NA 141 06/16/2021 1602   K 3.9 08/15/2023 1258   CL 100 08/15/2023 1258   CO2 28 08/15/2023 1258   BUN 10 08/15/2023 1258   BUN 9 06/16/2021 1602   CREATININE 0.75 08/15/2023 1258      Component Value Date/Time   CALCIUM 9.3 08/15/2023 1258   ALKPHOS 48 08/15/2023 1258   AST 10 08/15/2023 1258   ALT 6 08/15/2023 1258   BILITOT 0.3 08/15/2023 1258   BILITOT 0.3 06/16/2021 1602       A/P: 1. Medication review: Patient is taking Humira  for Ankylosing Spondylitis. Reviewed the medication with the patient, including the following: Humira  is a TNF blocking agent indicated for ankylosing spondylitis, Crohn's disease, Hidradenitis suppurativa, psoriatic arthritis, plaque psoriasis, ulcerative colitis, and uveitis. Patient educated on purpose, proper use and potential adverse effects of Humira . Possible adverse effects are increased risk of infections, headache, and injection site reactions. There is the possibility of an increased risk of malignancy but it is not well understood if this increased risk  is due to there medication or the disease state. There are rare cases of pancytopenia and aplastic anemia. For SubQ injection at separate sites in the thigh or lower abdomen (avoiding areas within 2 inches of navel); rotate injection sites. May leave at room temperature for ~15 to 30 minutes prior to use; do not remove cap or cover while allowing product to reach room temperature. Do not use if solution is discolored or contains particulate matter. Do not administer to skin which is red, tender, bruised, hard, or that has scars, stretch marks, or psoriasis plaques. Prefilled pens and syringes are available for use by patients and the full amount of the syringe should be injected (self-administration); the vial is intended for institutional use only. Vials do not contain a preservative; discard unused portion. No recommendations for any changes at this time.   Herlene Fleeta Morris, PharmD, JAQUELINE, CPP Clinical Pharmacist [facility] [phone]

## 2023-12-09 ENCOUNTER — Other Ambulatory Visit: Payer: Self-pay

## 2023-12-10 ENCOUNTER — Other Ambulatory Visit: Payer: Self-pay

## 2023-12-30 ENCOUNTER — Other Ambulatory Visit (HOSPITAL_COMMUNITY): Payer: Self-pay

## 2024-01-01 ENCOUNTER — Other Ambulatory Visit: Payer: Self-pay | Admitting: Pharmacy Technician

## 2024-01-01 ENCOUNTER — Other Ambulatory Visit: Payer: Self-pay

## 2024-01-01 NOTE — Progress Notes (Signed)
 Specialty Pharmacy Refill Coordination Note  Caitlin Burns is a 43 y.o. female contacted today regarding refills of specialty medication(s) Adalimumab  (Humira  (2 Pen))   Patient requested Delivery   Delivery date: 01/02/24   Verified address: [address]  [place] [address]   Medication will be filled on 01/01/24.  Injection due on 9/3.

## 2024-01-19 ENCOUNTER — Other Ambulatory Visit: Payer: Self-pay

## 2024-01-22 ENCOUNTER — Other Ambulatory Visit (HOSPITAL_COMMUNITY): Payer: Self-pay

## 2024-01-27 ENCOUNTER — Other Ambulatory Visit: Payer: Self-pay

## 2024-01-28 ENCOUNTER — Encounter (INDEPENDENT_AMBULATORY_CARE_PROVIDER_SITE_OTHER): Payer: Self-pay

## 2024-01-29 ENCOUNTER — Other Ambulatory Visit: Payer: Self-pay

## 2024-01-29 NOTE — Progress Notes (Signed)
 Specialty Pharmacy Refill Coordination Note  Caitlin Burns is a 43 y.o. female contacted today regarding refills of specialty medication(s) Adalimumab  (Humira  (2 Pen))   Patient requested (Patient-Rptd) Delivery   Delivery date: 01/31/24   Verified address: (Patient-Rptd) [address] [place] [address]   Medication will be filled on 01/30/24.

## 2024-02-04 ENCOUNTER — Telehealth: Payer: Self-pay

## 2024-02-04 ENCOUNTER — Other Ambulatory Visit: Payer: Self-pay

## 2024-02-04 ENCOUNTER — Other Ambulatory Visit (HOSPITAL_COMMUNITY): Payer: Self-pay

## 2024-02-04 NOTE — Telephone Encounter (Signed)
 Pharmacy Patient Advocate Encounter   Received notification from Onbase that prior authorization for Nurtec 75 is due for renewal.   Insurance verification completed.   The patient is insured through [facility].  Action: PA required; PA submitted to above mentioned insurance via Latent Key/confirmation #/[id number] Status is pending

## 2024-02-04 NOTE — Telephone Encounter (Signed)
 Pharmacy Patient Advocate Encounter  Received notification from [employer] that Prior Authorization for Nurtec 75 has been APPROVED from 02/04/24 to 02/02/25. Ran test claim, Copay is $0.00 for 15 tabs per 30 days. This test claim was processed through [facility] copay amounts may vary at other pharmacies due to pharmacy/plan contracts, or as the patient moves through the different stages of their insurance plan.   PA #/Case ID/Reference #: [id number]

## 2024-02-20 ENCOUNTER — Other Ambulatory Visit: Payer: Self-pay

## 2024-02-23 ENCOUNTER — Other Ambulatory Visit: Payer: Self-pay

## 2024-02-23 ENCOUNTER — Encounter (INDEPENDENT_AMBULATORY_CARE_PROVIDER_SITE_OTHER): Payer: Self-pay

## 2024-02-24 ENCOUNTER — Other Ambulatory Visit: Payer: Self-pay

## 2024-02-24 NOTE — Progress Notes (Signed)
 Specialty Pharmacy Refill Coordination Note  Caitlin Burns is a 43 y.o. female contacted today regarding refills of specialty medication(s) Adalimumab  (Humira  (2 Pen))   Patient requested Delivery   Delivery date: 02/26/24   Verified address: [address] [place] [address]   Medication will be filled on 10.21.25.

## 2024-03-03 ENCOUNTER — Other Ambulatory Visit: Payer: Self-pay | Admitting: Pharmacist

## 2024-03-03 ENCOUNTER — Other Ambulatory Visit: Payer: Self-pay

## 2024-03-03 ENCOUNTER — Other Ambulatory Visit (HOSPITAL_COMMUNITY): Payer: Self-pay

## 2024-03-03 DIAGNOSIS — M47819 Spondylosis without myelopathy or radiculopathy, site unspecified: Secondary | ICD-10-CM | POA: Diagnosis not present

## 2024-03-03 DIAGNOSIS — M458 Ankylosing spondylitis sacral and sacrococcygeal region: Secondary | ICD-10-CM | POA: Diagnosis not present

## 2024-03-03 DIAGNOSIS — I7300 Raynaud's syndrome without gangrene: Secondary | ICD-10-CM | POA: Diagnosis not present

## 2024-03-03 DIAGNOSIS — Z7960 Long term (current) use of unspecified immunomodulators and immunosuppressants: Secondary | ICD-10-CM | POA: Diagnosis not present

## 2024-03-03 MED ORDER — HUMIRA (2 PEN) 40 MG/0.4ML ~~LOC~~ AJKT: AUTO-INJECTOR | SUBCUTANEOUS | 1 refills | Status: DC

## 2024-03-03 MED ORDER — HUMIRA (2 PEN) 40 MG/0.4ML ~~LOC~~ AJKT
AUTO-INJECTOR | SUBCUTANEOUS | 1 refills | Status: AC
  Filled 2024-03-04: qty 6, fill #0
  Filled 2024-03-23: qty 2, 28d supply, fill #0
  Filled 2024-04-21: qty 2, 28d supply, fill #1
  Filled 2024-05-14: qty 2, 28d supply, fill #2

## 2024-03-04 ENCOUNTER — Other Ambulatory Visit: Payer: Self-pay

## 2024-03-04 ENCOUNTER — Other Ambulatory Visit (HOSPITAL_COMMUNITY): Payer: Self-pay

## 2024-03-23 ENCOUNTER — Other Ambulatory Visit: Payer: Self-pay

## 2024-03-23 ENCOUNTER — Other Ambulatory Visit (HOSPITAL_COMMUNITY): Payer: Self-pay

## 2024-03-24 ENCOUNTER — Encounter (INDEPENDENT_AMBULATORY_CARE_PROVIDER_SITE_OTHER): Payer: Self-pay

## 2024-03-24 ENCOUNTER — Other Ambulatory Visit: Payer: Self-pay

## 2024-03-24 NOTE — Progress Notes (Signed)
 Specialty Pharmacy Refill Coordination Note  Caitlin Burns is a 43 y.o. female contacted today regarding refills of specialty medication(s) Adalimumab  (Humira  (2 Pen))   Patient requested (Patient-Rptd) Delivery   Delivery date: 04/01/24   Verified address: (Patient-Rptd) [address] [place] [address]   Medication will be filled on: 03/31/24

## 2024-03-25 ENCOUNTER — Other Ambulatory Visit: Payer: Self-pay

## 2024-03-26 ENCOUNTER — Other Ambulatory Visit: Payer: Self-pay

## 2024-03-30 ENCOUNTER — Other Ambulatory Visit (HOSPITAL_COMMUNITY): Payer: Self-pay

## 2024-03-30 ENCOUNTER — Other Ambulatory Visit: Payer: Self-pay

## 2024-03-31 ENCOUNTER — Other Ambulatory Visit: Payer: Self-pay

## 2024-03-31 MED ORDER — DROSPIRENONE-ETHINYL ESTRADIOL 3-0.02 MG PO TABS
ORAL_TABLET | ORAL | 0 refills | Status: AC
  Filled 2024-03-31: qty 84, 63d supply, fill #0

## 2024-04-12 ENCOUNTER — Other Ambulatory Visit: Payer: Self-pay | Admitting: Adult Health

## 2024-04-12 DIAGNOSIS — F410 Panic disorder [episodic paroxysmal anxiety] without agoraphobia: Secondary | ICD-10-CM

## 2024-04-12 DIAGNOSIS — F411 Generalized anxiety disorder: Secondary | ICD-10-CM

## 2024-04-13 ENCOUNTER — Other Ambulatory Visit: Payer: Self-pay

## 2024-04-13 ENCOUNTER — Other Ambulatory Visit: Payer: Self-pay | Admitting: Adult Health

## 2024-04-13 DIAGNOSIS — F410 Panic disorder [episodic paroxysmal anxiety] without agoraphobia: Secondary | ICD-10-CM

## 2024-04-13 DIAGNOSIS — F411 Generalized anxiety disorder: Secondary | ICD-10-CM

## 2024-04-13 MED FILL — Alprazolam Tab 0.5 MG: ORAL | 30 days supply | Qty: 90 | Fill #0 | Status: AC

## 2024-04-14 ENCOUNTER — Other Ambulatory Visit: Payer: Self-pay | Admitting: Adult Health

## 2024-04-14 ENCOUNTER — Other Ambulatory Visit: Payer: Self-pay

## 2024-04-14 DIAGNOSIS — F411 Generalized anxiety disorder: Secondary | ICD-10-CM

## 2024-04-14 DIAGNOSIS — F902 Attention-deficit hyperactivity disorder, combined type: Secondary | ICD-10-CM

## 2024-04-15 ENCOUNTER — Other Ambulatory Visit: Payer: Self-pay

## 2024-04-16 ENCOUNTER — Other Ambulatory Visit: Payer: Self-pay

## 2024-04-16 MED FILL — Lisdexamfetamine Dimesylate Cap 60 MG: ORAL | 90 days supply | Qty: 90 | Fill #0 | Status: CN

## 2024-04-17 ENCOUNTER — Other Ambulatory Visit: Payer: Self-pay

## 2024-04-17 MED FILL — Lisdexamfetamine Dimesylate Cap 60 MG: ORAL | 90 days supply | Qty: 90 | Fill #0 | Status: AC

## 2024-04-21 ENCOUNTER — Other Ambulatory Visit: Payer: Self-pay

## 2024-04-21 NOTE — Progress Notes (Signed)
 Specialty Pharmacy Refill Coordination Note  Caitlin Burns is a 43 y.o. female contacted today regarding refills of specialty medication(s) Adalimumab  (Humira  (2 Pen))   Patient requested (Patient-Rptd) Delivery   Delivery date: 04/24/24   Verified address: (Patient-Rptd) [address] [place], [place] [address]   Medication will be filled on: 04/23/24

## 2024-05-14 ENCOUNTER — Other Ambulatory Visit: Payer: Self-pay

## 2024-05-15 ENCOUNTER — Other Ambulatory Visit: Payer: Self-pay

## 2024-05-15 ENCOUNTER — Telehealth: Admitting: Adult Health

## 2024-05-15 ENCOUNTER — Telehealth: Admitting: Physician Assistant

## 2024-05-15 ENCOUNTER — Encounter: Payer: Self-pay | Admitting: Adult Health

## 2024-05-15 DIAGNOSIS — F411 Generalized anxiety disorder: Secondary | ICD-10-CM

## 2024-05-15 DIAGNOSIS — F410 Panic disorder [episodic paroxysmal anxiety] without agoraphobia: Secondary | ICD-10-CM | POA: Diagnosis not present

## 2024-05-15 DIAGNOSIS — B3731 Acute candidiasis of vulva and vagina: Secondary | ICD-10-CM

## 2024-05-15 DIAGNOSIS — F902 Attention-deficit hyperactivity disorder, combined type: Secondary | ICD-10-CM

## 2024-05-15 DIAGNOSIS — F909 Attention-deficit hyperactivity disorder, unspecified type: Secondary | ICD-10-CM | POA: Diagnosis not present

## 2024-05-15 MED ORDER — FLUCONAZOLE 150 MG PO TABS
150.0000 mg | ORAL_TABLET | ORAL | 0 refills | Status: AC | PRN
  Filled 2024-05-15: qty 2, 6d supply, fill #0

## 2024-05-15 MED ORDER — ALPRAZOLAM 0.5 MG PO TABS
0.5000 mg | ORAL_TABLET | Freq: Three times a day (TID) | ORAL | 0 refills | Status: AC | PRN
  Filled 2024-05-15: qty 90, 30d supply, fill #0

## 2024-05-15 MED ORDER — NYSTATIN 100000 UNIT/GM EX CREA
1.0000 | TOPICAL_CREAM | Freq: Two times a day (BID) | CUTANEOUS | 0 refills | Status: AC
  Filled 2024-05-15: qty 30, 15d supply, fill #0

## 2024-05-15 MED ORDER — LISDEXAMFETAMINE DIMESYLATE 60 MG PO CAPS
60.0000 mg | ORAL_CAPSULE | ORAL | 0 refills | Status: AC
  Filled 2024-05-15: qty 90, 90d supply, fill #0
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
## 2024-05-15 NOTE — Progress Notes (Signed)

## 2024-05-16 ENCOUNTER — Other Ambulatory Visit: Payer: Self-pay

## 2024-05-18 ENCOUNTER — Other Ambulatory Visit: Payer: Self-pay

## 2024-05-18 ENCOUNTER — Other Ambulatory Visit (HOSPITAL_COMMUNITY): Payer: Self-pay

## 2024-05-18 NOTE — Progress Notes (Signed)
 Specialty Pharmacy Refill Coordination Note  MyChart Questionnaire Submission  Caitlin Burns is a 44 y.o. female contacted today regarding refills of specialty medication(s) Humira .  Doses on hand: 0   Next inj: 05/31/23  Patient requested: Delivery  Delivery date: 05/26/24  Verified address: [address] [place] [address]  Medication will be filled on 05/25/24

## 2024-05-20 ENCOUNTER — Other Ambulatory Visit: Payer: Self-pay

## 2024-05-20 MED ORDER — NORETHINDRONE 0.35 MG PO TABS
1.0000 | ORAL_TABLET | Freq: Every day | ORAL | 3 refills | Status: AC
  Filled 2024-05-20 – 2024-05-22 (×3): qty 84, 84d supply, fill #0

## 2024-05-22 ENCOUNTER — Other Ambulatory Visit: Payer: Self-pay

## 2024-05-22 ENCOUNTER — Other Ambulatory Visit (HOSPITAL_COMMUNITY): Payer: Self-pay

## 2024-05-25 ENCOUNTER — Other Ambulatory Visit: Payer: Self-pay

## 2024-05-27 ENCOUNTER — Other Ambulatory Visit: Payer: Self-pay

## 2024-06-01 ENCOUNTER — Other Ambulatory Visit: Payer: Self-pay

## 2024-06-02 ENCOUNTER — Encounter: Payer: Self-pay | Admitting: Family Medicine

## 2024-06-03 ENCOUNTER — Other Ambulatory Visit: Payer: Self-pay | Admitting: Pharmacist

## 2024-06-03 ENCOUNTER — Other Ambulatory Visit: Payer: Self-pay

## 2024-06-03 NOTE — Progress Notes (Signed)
 Specialty Pharmacy Ongoing Clinical Assessment Note  Caitlin Burns is a 44 y.o. female who is being followed by the specialty pharmacy service for RxSp Ankylosing Spondylitis   Patient's specialty medication(s) reviewed today: No data recorded  Missed doses in the last 4 weeks: 0   Patient/Caregiver did not have any additional questions or concerns.   Therapeutic benefit summary: Patient is achieving benefit   Adverse events/side effects summary: No adverse events/side effects   Patient's therapy is appropriate to: Continue    Goals Addressed             This Visit's Progress    Minimize recurrence of flares   On track    Patient is on track. Patient will maintain adherence         Follow up: 12 months  Lyle LELON Chalk Specialty Pharmacist

## 2024-06-05 ENCOUNTER — Encounter: Payer: Self-pay | Admitting: Family Medicine

## 2024-06-05 ENCOUNTER — Ambulatory Visit: Admitting: Family Medicine

## 2024-06-05 ENCOUNTER — Other Ambulatory Visit: Payer: Self-pay

## 2024-06-05 VITALS — BP 130/90 | HR 90 | Temp 98.5°F | Ht 66.0 in | Wt 152.8 lb

## 2024-06-05 DIAGNOSIS — R079 Chest pain, unspecified: Secondary | ICD-10-CM | POA: Diagnosis not present

## 2024-06-05 DIAGNOSIS — I10 Essential (primary) hypertension: Secondary | ICD-10-CM

## 2024-06-05 DIAGNOSIS — H9311 Tinnitus, right ear: Secondary | ICD-10-CM | POA: Insufficient documentation

## 2024-06-05 DIAGNOSIS — R0789 Other chest pain: Secondary | ICD-10-CM | POA: Diagnosis not present

## 2024-06-05 MED ORDER — FLUTICASONE PROPIONATE 50 MCG/ACT NA SUSP
2.0000 | Freq: Every day | NASAL | 3 refills | Status: AC
  Filled 2024-06-05: qty 48, 90d supply, fill #0

## 2024-06-05 NOTE — Assessment & Plan Note (Signed)
 Chronic, traditionally well controlled with amlodipine  5mg  daily - but some elevated readings recently. Encouraged good water intake, increased potassium, aerobic exercise to improve BP control. She will continue to monitor blood pressures at home and let me know if consistently >140/90.

## 2024-06-05 NOTE — Telephone Encounter (Signed)
 Will see today.

## 2024-06-05 NOTE — Patient Instructions (Signed)
 VISIT SUMMARY: During your visit, we discussed the persistent ringing in your right ear, your elevated blood pressure, and intermittent chest pain. We reviewed your symptoms, current medications, and possible causes for each issue. We also discussed treatment options and lifestyle modifications to help manage your conditions.  YOUR PLAN: -TINNITUS, RIGHT EAR: Tinnitus is a condition characterized by ringing or other noises in one or both ears. For your persistent high-pitched tinnitus, we recommend using mineral oil or sweet's oil to soften earwax and Flonase  nasal spray to reduce Eustachian tube inflammation. Monitor your symptoms and let us  know if the tinnitus persists, as we may need to adjust your medications. Medications that may contribute to ringing include cyclobenzaprine , xanax , amlodipine  and pantoprazole .  -ESSENTIAL HYPERTENSION: Essential hypertension is high blood pressure with no identifiable cause. Your recent blood pressure readings were elevated. We recommend monitoring your blood pressure at home and considering adding metoprolol  if it remains high. Additionally, we encourage lifestyle modifications such as a potassium-rich diet and regular aerobic exercise to help manage your blood pressure.  -CHEST PAIN: Intermittent chest pain can be related to high blood pressure or anxiety. We have ordered an EKG to assess your cardiac status. Please monitor your symptoms and let us  know if the chest pain persists, as further evaluation may be necessary.  INSTRUCTIONS: Monitor your blood pressure at home and keep a record of your readings. Use mineral oil for earwax and Flonase  nasal spray as directed. Follow up with us  if your tinnitus or chest pain persists. We will review your EKG results once they are available.

## 2024-06-05 NOTE — Assessment & Plan Note (Addendum)
?  med related vs ETD related vs cerumen - cleaned cerumen in office today, rec start flonase , update if ongoing to consider med change (PPI vs amlodipine ).  Medications that may contribute to ringing include cyclobenzaprine , xanax , amlodipine  and pantoprazole . No associated vertigo, not consistent with labrynthitis, pulsatile tinnitus.

## 2024-06-05 NOTE — Assessment & Plan Note (Signed)
 Episode today with associated flutter heart moving feeling  Reassuring EKG today.  To consider additional antihypertensive pending home BP readings.

## 2024-06-08 ENCOUNTER — Other Ambulatory Visit: Payer: Self-pay

## 2024-06-08 ENCOUNTER — Telehealth: Payer: Self-pay

## 2024-06-08 NOTE — Telephone Encounter (Signed)
 Pharmacy Patient Advocate Encounter   Received notification from Pt Calls Messages that prior authorization for Humira  CF is required/requested.   Insurance verification completed.   The patient is insured through [facility].   Per test claim: PA required; PA submitted to above mentioned insurance via Latent Key/confirmation #/EOC [facility] Status is pending

## 2024-06-11 ENCOUNTER — Encounter: Payer: Self-pay | Admitting: Family Medicine

## 2024-06-11 ENCOUNTER — Other Ambulatory Visit: Payer: Self-pay

## 2024-06-29 ENCOUNTER — Other Ambulatory Visit: Payer: Commercial Managed Care - PPO

## 2024-07-06 ENCOUNTER — Encounter: Payer: Commercial Managed Care - PPO | Admitting: Family Medicine

## 2024-11-12 ENCOUNTER — Telehealth: Admitting: Adult Health
